# Patient Record
Sex: Male | Born: 1945
Health system: Southern US, Community
[De-identification: ages and names within clinical notes are randomized; demographics above are authoritative.]

## PROBLEM LIST (undated history)

## (undated) DIAGNOSIS — I493 Ventricular premature depolarization: Secondary | ICD-10-CM

## (undated) DIAGNOSIS — I4892 Unspecified atrial flutter: Secondary | ICD-10-CM

## (undated) DIAGNOSIS — Q2381 Bicuspid aortic valve: Secondary | ICD-10-CM

## (undated) DIAGNOSIS — I350 Nonrheumatic aortic (valve) stenosis: Secondary | ICD-10-CM

## (undated) DIAGNOSIS — I480 Paroxysmal atrial fibrillation: Secondary | ICD-10-CM

## (undated) DIAGNOSIS — Q231 Congenital insufficiency of aortic valve: Secondary | ICD-10-CM

## (undated) DIAGNOSIS — I719 Aortic aneurysm of unspecified site, without rupture: Secondary | ICD-10-CM

## (undated) DIAGNOSIS — K219 Gastro-esophageal reflux disease without esophagitis: Secondary | ICD-10-CM

## (undated) DIAGNOSIS — I4510 Unspecified right bundle-branch block: Secondary | ICD-10-CM

## (undated) DIAGNOSIS — I4729 Other ventricular tachycardia: Secondary | ICD-10-CM

## (undated) DIAGNOSIS — I472 Ventricular tachycardia: Secondary | ICD-10-CM

## (undated) DIAGNOSIS — Q230 Congenital stenosis of aortic valve: Secondary | ICD-10-CM

## (undated) HISTORY — DX: Unspecified right bundle-branch block: I45.10

## (undated) HISTORY — DX: Congenital insufficiency of aortic valve: Q23.1

## (undated) HISTORY — DX: Unspecified atrial flutter: I48.92

## (undated) HISTORY — PX: CYST EXCISION: SHX5701

## (undated) HISTORY — DX: Other ventricular tachycardia: I47.29

## (undated) HISTORY — DX: Ventricular premature depolarization: I49.3

## (undated) HISTORY — DX: Paroxysmal atrial fibrillation: I48.0

## (undated) HISTORY — DX: Aortic aneurysm of unspecified site, without rupture: I71.9

## (undated) HISTORY — DX: Gastro-esophageal reflux disease without esophagitis: K21.9

## (undated) HISTORY — DX: Nonrheumatic aortic (valve) stenosis: I35.0

## (undated) HISTORY — DX: Ventricular tachycardia: I47.2

## (undated) HISTORY — DX: Congenital stenosis of aortic valve: Q23.0

## (undated) HISTORY — DX: Bicuspid aortic valve: Q23.81

## (undated) HISTORY — PX: COLONOSCOPY: SHX174

---

## 2002-08-26 ENCOUNTER — Ambulatory Visit (HOSPITAL_COMMUNITY): Admission: RE | Admit: 2002-08-26 | Discharge: 2002-08-26 | Payer: Self-pay | Admitting: Gastroenterology

## 2003-09-09 ENCOUNTER — Emergency Department (HOSPITAL_COMMUNITY): Admission: EM | Admit: 2003-09-09 | Discharge: 2003-09-09 | Payer: Self-pay | Admitting: Emergency Medicine

## 2014-09-04 DIAGNOSIS — R002 Palpitations: Secondary | ICD-10-CM | POA: Insufficient documentation

## 2017-05-29 DIAGNOSIS — L819 Disorder of pigmentation, unspecified: Secondary | ICD-10-CM | POA: Diagnosis not present

## 2017-05-29 DIAGNOSIS — D485 Neoplasm of uncertain behavior of skin: Secondary | ICD-10-CM | POA: Diagnosis not present

## 2017-05-29 DIAGNOSIS — L570 Actinic keratosis: Secondary | ICD-10-CM | POA: Diagnosis not present

## 2017-05-29 DIAGNOSIS — L820 Inflamed seborrheic keratosis: Secondary | ICD-10-CM | POA: Diagnosis not present

## 2017-05-29 DIAGNOSIS — L821 Other seborrheic keratosis: Secondary | ICD-10-CM | POA: Diagnosis not present

## 2017-07-08 DIAGNOSIS — L819 Disorder of pigmentation, unspecified: Secondary | ICD-10-CM | POA: Diagnosis not present

## 2017-07-08 DIAGNOSIS — L821 Other seborrheic keratosis: Secondary | ICD-10-CM | POA: Diagnosis not present

## 2017-09-03 ENCOUNTER — Encounter: Payer: Self-pay | Admitting: Family Medicine

## 2017-09-03 ENCOUNTER — Ambulatory Visit (INDEPENDENT_AMBULATORY_CARE_PROVIDER_SITE_OTHER): Payer: PPO | Admitting: Family Medicine

## 2017-09-03 VITALS — BP 146/70 | HR 62 | Temp 97.8°F | Resp 12 | Ht 73.5 in | Wt 202.0 lb

## 2017-09-03 DIAGNOSIS — Z1159 Encounter for screening for other viral diseases: Secondary | ICD-10-CM | POA: Diagnosis not present

## 2017-09-03 DIAGNOSIS — R011 Cardiac murmur, unspecified: Secondary | ICD-10-CM | POA: Diagnosis not present

## 2017-09-03 DIAGNOSIS — Z7689 Persons encountering health services in other specified circumstances: Secondary | ICD-10-CM | POA: Diagnosis not present

## 2017-09-03 DIAGNOSIS — R002 Palpitations: Secondary | ICD-10-CM

## 2017-09-03 DIAGNOSIS — E663 Overweight: Secondary | ICD-10-CM

## 2017-09-03 NOTE — Progress Notes (Signed)
Patient ID: Jerry Velazquez, male    DOB: 1945-06-27, 72 y.o.   MRN: [id number]  PCP: [name], PA-C  Chief Complaint  Patient presents with  . Palpitations  . Establish Care    fasting    Subjective:   Jerry Velazquez is a 72 y.o. male is new to establish care here presents with chief complaint of palpitations and would like to be checked for A.fib and make sure he is not at risk for having a stroke.    Patient states that he has been feeling a flutter in his chest that used to come in frequently and intermittently he believes it began several years ago.  It first occurred when he was drinking alcohol more heavily and on days when he drank a lot of alcohol he would feel a flutter and it would regularly recur with heavy drinking.  His symptoms became more noticeable and more frequent over the last 1 to 2 months, without any associated symptoms he denies any dizziness, weakness, chest pain, diaphoresis, near syncope.  Feels a sensation of a flutter in his upper chest but he feels like his pulse has been regular when he checked it.  Currently he is having symptoms pretty much every day and is even beginning to wake him up at night.  He has stopped drinking alcohol completely.  He used to be a very heavy caffeine drinker he states and 5 weeks ago he cold [place] stop caffeine and alcohol intake.  Symptoms did not improve at all and they have become gradually worse.   Patient states that he has "never had a PCP" and has not had any health problems in the past.  He reports being a former smoker and smoked a pipe from age [age] over 80 years.  He had a colonoscopy done 12 years ago with [facility] physicians.  He is married and sexually active with his wife although 12 years ago he did begin to have some erectile dysfunction problems and he believes he had his PSA done about 12 years ago.  He has not had any other recent well visits, screening test, immunizations or vaccinations.  He takes supplements  over-the-counter including vitamin D, glucosamine and omega-3 fatty acids.  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  He notes family history of his father who had a stroke at the age of 87 or 96 and other than that believes he has high blood pressure.  He has a brother with high blood pressure and skin cancer, sister with skin cancer, another living sister in good health, and believes his mother died at age 37 with some edema or congestive heart failure.  There are no active problems to display for this patient.    Prior to Admission medications   Medication Sig Start Date End Date Taking? Authorizing Provider  Cholecalciferol (HM VITAMIN D3) 4000 units CAPS Take 4,000 Units by mouth daily.   Yes [provider]  GLUCOSAMINE HCL PO Take 2,000 mg by mouth daily.   Yes [provider]  Omega-3 Fatty Acids (FISH OIL) 1000 MG CAPS Take by mouth.   Yes [provider]     No Known Allergies   Family History  Problem Relation Age of Onset  . Heart disease Mother   . Hypertension Father   . Stroke Father   .  COPD Sister   . Hypertension Brother   . COPD Brother      Social History   Socioeconomic History  . Marital status: Married    Spouse name: Not on file  . Number of children: Not on file  . Years of education: Not on file  . Highest education level: Not on file  Occupational History  . 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 Financial resource strain: Not on file  . Food insecurity:    Worry: Not on file    Inability: Not on file  . Transportation needs:    Medical: Not on file    Non-medical: Not on file  Tobacco Use  . Smoking status: Former Smoker    Years: 18.00    Types: Pipe  . Smokeless tobacco: Never Used  . Tobacco comment: smoked a pipe  Substance and Sexual  Activity  . Alcohol use: Not Currently  . Drug use: Never  . Sexual activity: Yes  Lifestyle  . Physical activity:    Days per week: Not on file    Minutes per session: Not on file  . Stress: Not on file  Relationships  . Social connections:    Talks on phone: Not on file    Gets together: Not on file    Attends religious service: Not on file    Active member of club or organization: Not on file    Attends meetings of clubs or organizations: Not on file    Relationship status: Not on file  . Intimate partner violence:    Fear of current or ex partner: Not on file    Emotionally abused: Not on file    Physically abused: Not on file    Forced sexual activity: Not on file  Other Topics Concern  . Not on file  Social History Narrative  . Not on file      Review of Systems  Constitution: Negative. Negative for chills, decreased appetite, diaphoresis, fever, malaise/fatigue, night sweats, weight gain and weight loss.  HENT: Negative.  Negative for congestion.   Eyes: Negative.  Negative for blurred vision, double vision and visual disturbance.  Cardiovascular: Positive for palpitations. Negative for chest pain, claudication, cyanosis, dyspnea on exertion, leg swelling, near-syncope, orthopnea, paroxysmal nocturnal dyspnea and syncope.  Respiratory: Negative.  Negative for cough, shortness of breath, sleep disturbances due to breathing and snoring.   Endocrine: Negative.   Hematologic/Lymphatic: Negative.  Negative for bleeding problem. Does not bruise/bleed easily.  Skin: Negative.   Musculoskeletal: Negative.  Negative for arthritis, back pain, falls, muscle cramps, muscle weakness, myalgias, neck pain and stiffness.  Gastrointestinal: Negative.  Negative for bloating, abdominal pain, constipation, diarrhea, nausea and vomiting.  Genitourinary: Negative.   Neurological: Negative.  Negative for difficulty with concentration, disturbances in coordination, focal weakness, headaches,  light-headedness, loss of balance, numbness, tremors, vertigo and weakness.  Psychiatric/Behavioral: Negative.   Allergic/Immunologic: Negative.   All other systems reviewed and are negative.      Objective:    Vitals:   09/03/17 0821  BP: (!) 146/70  Pulse: 62  Resp: 12  Temp: 97.8 F (36.6 C)  TempSrc: Oral  SpO2: 97%  Weight: 202 lb (91.6 kg)  Height: 6' 1.5" (1.867 m)      Physical Exam  Constitutional: He is oriented to person, place, and time. He appears well-developed and well-nourished.  Non-toxic appearance. He does not have a sickly appearance. He does  not appear ill. No distress.  Well-appearing male, appears stated age, no acute distress  HENT:  Head: Normocephalic and atraumatic.  Right Ear: External ear normal.  Left Ear: External ear normal.  Nose: Nose normal. No mucosal edema or rhinorrhea.  Mouth/Throat: Uvula is midline, oropharynx is clear and moist and mucous membranes are normal. Mucous membranes are not pale, not dry and not cyanotic. No trismus in the jaw. No uvula swelling. No oropharyngeal exudate, posterior oropharyngeal edema or posterior oropharyngeal erythema.  Eyes: Pupils are equal, round, and reactive to light. Conjunctivae, EOM and lids are normal.  Neck: Trachea normal, normal range of motion and phonation normal. Neck supple. No JVD present. Carotid bruit is not present. No tracheal deviation present. No thyroid mass and no thyromegaly present.  Cardiovascular: Normal rate, regular rhythm, intact distal pulses and normal pulses.  No extrasystoles are present. PMI is not displaced. Exam reveals no gallop and no friction rub.  Murmur heard. Pulses:      Radial pulses are 2+ on the right side, and 2+ on the left side.       Dorsalis pedis pulses are 2+ on the right side, and 2+ on the left side.       Posterior tibial pulses are 2+ on the right side, and 2+ on the left side.  Murmur radiates throughout precordium Do not hear with auscultation  of carotids or left axilla, no palpable thrill to chest wall No lower extremity edema  Pulmonary/Chest: Effort normal and breath sounds normal. He has no wheezes. He has no rhonchi. He has no rales.  Abdominal: Soft. Normal appearance and bowel sounds are normal. He exhibits no distension. There is no tenderness. There is no rebound and no guarding.  Musculoskeletal: Normal range of motion. He exhibits no edema.  Neurological: He is alert and oriented to person, place, and time. Gait normal.  Skin: Skin is warm, dry and intact. Capillary refill takes less than 2 seconds. No rash noted. He is not diaphoretic.  Psychiatric: He has a normal mood and affect. His speech is normal and behavior is normal.  Nursing note and vitals reviewed.   EKG:  Sinus bradycardia, HR 46, left axis deviation, nonspecific widening of QRS interval .112 sec, no ST elevation or depression No PVC's      Assessment & Plan:      ICD-10-CM   1. Palpitations R00.2 EKG 12-Lead    CBC with Differential/Platelet    COMPLETE METABOLIC PANEL WITH GFR    Lipid panel    TSH    ECHOCARDIOGRAM COMPLETE  2. Murmur R01.1 ECHOCARDIOGRAM COMPLETE  3. Overweight (BMI 25.0-29.9) E66.3   4. Need for hepatitis C screening test Z11.59 Hepatitis C antibody  5. Encounter to establish care with new doctor Z76.89     Otherwise healthy 72 year old male presents with fluttering in his chest for several years that has worsened over the past 2 months, now occurring daily and he feels it when he wakes up in the middle the night, he has stopped drinking alcohol completely and stop caffeine use completely, not a current smoker, BMI very mildly elevated, no strong family history of CVD.  Patient is very well-appearing without any associated symptoms.  No syncope, weakness, dyspnea on exertion, extremity edema, PND, chest pain.    Physical exam he had regular pulses without any signs of fluid overload.  He did have a murmur which radiated  throughout the precordium.  And otherwise physical exam was unremarkable and he is  well-appearing elderly male, looks younger than his age and appears to be in good physical condition.  EKG did not reveal any arrhythmia but there is some minor abnormalities and sinus brady.  Will work-up murmur and palpitations with Holter monitor and echocardiogram, and labs - CBC, CMP, TSH, FLP.  Pt had mildly elevated BP, but was very anxious.  HR was normal with vital signs and at time of exam, EKG showed sinus bradycardia.    [name], PA-C 09/03/17 10:19 AM    09/10/17 8:41 PM  Results for orders placed or performed in visit on 09/03/17  CBC with Differential/Platelet  Result Value Ref Range   WBC 4.3 3.8 - 10.8 Thousand/uL   RBC 4.55 4.20 - 5.80 Million/uL   Hemoglobin 14.0 13.2 - 17.1 g/dL   HCT 41.3 38.5 - 50.0 %   MCV 90.8 80.0 - 100.0 fL   MCH 30.8 27.0 - 33.0 pg   MCHC 33.9 32.0 - 36.0 g/dL   RDW 13.0 11.0 - 15.0 %   Platelets 204 140 - 400 Thousand/uL   MPV 11.1 7.5 - 12.5 fL   Neutro Abs 2,111 1,500 - 7,800 cells/uL   Lymphs Abs 1,406 850 - 3,900 cells/uL   WBC mixed population 525 200 - 950 cells/uL   Eosinophils Absolute 219 15 - 500 cells/uL   Basophils Absolute 39 0 - 200 cells/uL   Neutrophils Relative % 49.1 %   Total Lymphocyte 32.7 %   Monocytes Relative 12.2 %   Eosinophils Relative 5.1 %   Basophils Relative 0.9 %  COMPLETE METABOLIC PANEL WITH GFR  Result Value Ref Range   Glucose, Bld 83 65 - 99 mg/dL   BUN 13 7 - 25 mg/dL   Creat 0.76 0.70 - 1.18 mg/dL   GFR, Est Non African American 91 > OR = 60 mL/min/1.81m2   GFR, Est African American 105 > OR = 60 mL/min/1.38m2   BUN/Creatinine Ratio NOT APPLICABLE 6 - 22 (calc)   Sodium 141 135 - 146 mmol/L   Potassium 4.2 3.5 - 5.3 mmol/L   Chloride 106 98 - 110 mmol/L   CO2 25 20 - 32 mmol/L   Calcium 8.9 8.6 - 10.3 mg/dL   Total Protein 6.7 6.1 - 8.1 g/dL   Albumin 4.1 3.6 - 5.1 g/dL   Globulin 2.6 1.9 - 3.7  g/dL (calc)   AG Ratio 1.6 1.0 - 2.5 (calc)   Total Bilirubin 0.7 0.2 - 1.2 mg/dL   Alkaline phosphatase (APISO) 69 40 - 115 U/L   AST 14 10 - 35 U/L   ALT 12 9 - 46 U/L  Lipid panel  Result Value Ref Range   Cholesterol 160 <200 mg/dL   HDL 44 >40 mg/dL   Triglycerides 44 <150 mg/dL   LDL Cholesterol (Calc) 103 (H) mg/dL (calc)   Total CHOL/HDL Ratio 3.6 <5.0 (calc)   Non-HDL Cholesterol (Calc) 116 <130 mg/dL (calc)  Hepatitis C antibody  Result Value Ref Range   Hepatitis C Ab NON-REACTIVE NON-REACTI   SIGNAL TO CUT-OFF 0.05 <1.00  TSH  Result Value Ref Range   TSH 1.47 0.40 - 4.50 mIU/L   Labs pertinent for mildly elevated LDL.  Normal electrolytes, excellent kidney function, normal liver function no anemia, fasting sugar was normal, TSH normal, screening hepatitis C was negative.  Holter monitor pending

## 2017-09-03 NOTE — Patient Instructions (Signed)
Palpitations A palpitation is the feeling that your heart:  Has an uneven (irregular) heartbeat.  Is beating faster than normal.  Is fluttering.  Is skipping a beat.  This is usually not a serious problem. In some cases, you may need more medical tests. Follow these instructions at home:  Avoid: ? Caffeine in coffee, tea, soft drinks, diet pills, and energy drinks. ? Chocolate. ? Alcohol.  Do not use any tobacco products. These include cigarettes, chewing tobacco, and e-cigarettes. If you need help quitting, ask your doctor.  Try to reduce your stress. These things may help: ? Yoga. ? Meditation. ? Physical activity. Swimming, jogging, and walking are good choices. ? A method that helps you use your mind to control things in your body, like heartbeats (biofeedback).  Get plenty of rest and sleep.  Take over-the-counter and prescription medicines only as told by your doctor.  Keep all follow-up visits as told by your doctor. This is important. Contact a doctor if:  Your heartbeat is still fast or uneven after 24 hours.  Your palpitations occur more often. Get help right away if:  You have chest pain.  You feel short of breath.  You have a very bad headache.  You feel dizzy.  You pass out (faint). This information is not intended to replace advice given to you by your health care provider. Make sure you discuss any questions you have with your health care provider. Document Released: 10/10/2007 Document Revised: 06/08/2015 Document Reviewed: 09/15/2014 Elsevier Interactive Patient Education  2018 [place] A heart murmur is an extra sound that is caused by chaotic blood flow. The murmur can be heard as a "hum" or "whoosh" sound when blood flows through the heart. The heart has four areas called chambers. Valves separate the upper and lower chambers from each other (tricuspid valve and mitral valve) and separate the lower chambers of the heart  from pathways that lead away from the heart (aortic valve and pulmonary valve). Normally, the valves open to let blood flow through or out of your heart, and then they shut to keep the blood from flowing backward. There are two types of heart murmurs:  Innocent murmurs. Most people with this type of heart murmur do not have a heart problem. Many children have innocent heart murmurs. Your health care provider may suggest some basic testing to find out whether your murmur is an innocent murmur. If an innocent heart murmur is found, there is no need for further tests or treatment and no need to restrict activities or stop playing sports.  Abnormal murmurs. These types of murmurs can occur in children and adults. Abnormal murmurs may be a sign of a more serious heart condition, such as a heart defect present at birth (congenital defect) or heart valve disease.  What are the causes? This condition is caused by heart valves that are not working properly. In children, abnormal heart murmurs are typically caused by congenital defects. In adults, abnormal murmurs are usually from heart valve problems caused by disease, infection, or aging. Three types of heart valve defects can cause a murmur:  Regurgitation. This is when blood leaks back through the valve in the wrong direction.  Mitral valve prolapse. This is when the mitral valve of the heart has a loose flap and does not close tightly.  Stenosis. This is when a valve does not open enough and blocks blood flow.  This condition may also be caused by:  Pregnancy.  Fever.  Overactive thyroid gland.  Anemia.  Exercise.  Rapid growth spurts (in children).  What are the signs or symptoms? Innocent murmurs do not cause symptoms, and many people with abnormal murmurs may or may not have symptoms. If symptoms do develop, they may include:  Shortness of breath.  Blue coloring of the skin, especially on the fingertips.  Chest  pain.  Palpitations, or feeling a fluttering or skipped heartbeat.  Fainting.  Persistent cough.  Getting tired much faster than expected.  Swelling in the abdomen, feet, or ankles.  How is this diagnosed? This condition may be diagnosed during a routine physical or other exam. If your health care provider hears a murmur with a stethoscope, he or she will listen for:  Where the murmur is located in your heart.  How long the murmur lasts (duration).  When the murmur is heard during the heartbeat.  How loud the murmur is. This may help the health care provider figure out what is causing the murmur.  You may be referred to a heart specialist (cardiologist). You may also have other tests, including:  Electrocardiogram (ECG or EKG). This test measures the electrical activity of your heart.  Echocardiogram. This test uses high frequency sound waves to make pictures of your heart.  MRI or chest X-ray.  Cardiac catheterization. This test looks at blood flow through the heart.  For children and adults who have an abnormal heart murmur and want to stay active, it is important to complete testing, review test results, and receive recommendations from your health care provider. If heart disease is present, it may not be safe to play or be active. How is this treated? Heart murmurs themselves do not need treatment. In some cases, a heart murmur may go away on its own. If an underlying problem or disease is causing the murmur, you may need treatment. If treatment is needed, it will depend on the type and severity of the disease or heart problem causing the murmur. Treatment may include:  Medicine.  Surgery.  Dietary and lifestyle changes.  Follow these instructions at home:  Talk with your health care provider before participating in sports or other activities that require a lot of effort and energy (are strenuous).  Learn as much as possible about your condition and any related  diseases. Ask your health care provider if you may at risk for any medical emergencies.  Talk with your health care provider about what symptoms you should look out for.  It is up to you to get your test results. Ask your health care provider, or the department that is doing the test, when your results will be ready.  Keep all follow-up visits as told by your health care provider. This is important. Contact a health care provider if:  You feel light-headed.  You are frequently short of breath.  You feel more tired than usual.  You are having a hard time keeping up with normal activities or fitness routines.  You have swelling in your ankles or feet.  You have chest pain.  You notice that your heart often beats irregularly.  You develop any new symptoms. Get help right away if:  You develop severe chest pain.  You are having trouble breathing.  You have fainting spells.  Your symptoms suddenly get worse. These symptoms may represent a serious problem that is an emergency. Do not wait to see if the symptoms will go away. Get medical help right away. Call your local emergency services (  911 in the [place] Do not drive yourself to the hospital. Summary  Normally, the heart valves open to let blood flow through or out of your heart, and then they shut to keep the blood from flowing backward.  Heart murmur is caused by heart valves that are not working properly.  You may need treatment if an underlying problem or disease is causing the heart murmur. Treatment may include medicine, surgery, or dietary and lifestyle changes.  Talk with your health care provider before participating in sports or other activities that require a lot of effort and energy (are strenuous).  Talk with your health care provider about what symptoms you should watch out for. This information is not intended to replace advice given to you by your health care provider. Make sure you discuss any questions you  have with your health care provider. Document Released: 02/08/2004 Document Revised: 12/20/2015 Document Reviewed: 12/20/2015 Elsevier Interactive Patient Education  2018 [place] Maintenance, Male A healthy lifestyle and preventive care is important for your health and wellness. Ask your health care provider about what schedule of regular examinations is right for you. What should I know about weight and diet? Eat a Healthy Diet  Eat plenty of vegetables, fruits, whole grains, low-fat dairy products, and lean protein.  Do not eat a lot of foods high in solid fats, added sugars, or salt.  Maintain a Healthy Weight Regular exercise can help you achieve or maintain a healthy weight. You should:  Do at least 150 minutes of exercise each week. The exercise should increase your heart rate and make you sweat (moderate-intensity exercise).  Do strength-training exercises at least twice a week.  Watch Your Levels of Cholesterol and Blood Lipids  Have your blood tested for lipids and cholesterol every 5 years starting at 73 years of age. If you are at high risk for heart disease, you should start having your blood tested when you are 73 years old. You may need to have your cholesterol levels checked more often if: ? Your lipid or cholesterol levels are high. ? You are older than 73 years of age. ? You are at high risk for heart disease.  What should I know about cancer screening? Many types of cancers can be detected early and may often be prevented. Lung Cancer  You should be screened every year for lung cancer if: ? You are a current smoker who has smoked for at least 30 years. ? You are a former smoker who has quit within the past 15 years.  Talk to your health care provider about your screening options, when you should start screening, and how often you should be screened.  Colorectal Cancer  Routine colorectal cancer screening usually begins at 73 years of age and  should be repeated every 5-10 years until you are 73 years old. You may need to be screened more often if early forms of precancerous polyps or small growths are found. Your health care provider may recommend screening at an earlier age if you have risk factors for colon cancer.  Your health care provider may recommend using home test kits to check for hidden blood in the stool.  A small camera at the end of a tube can be used to examine your colon (sigmoidoscopy or colonoscopy). This checks for the earliest forms of colorectal cancer.  Prostate and Testicular Cancer  Depending on your age and overall health, your health care provider may do certain tests to screen for  prostate and testicular cancer.  Talk to your health care provider about any symptoms or concerns you have about testicular or prostate cancer.  Skin Cancer  Check your skin from head to toe regularly.  Tell your health care provider about any new moles or changes in moles, especially if: ? There is a change in a mole's size, shape, or color. ? You have a mole that is larger than a pencil eraser.  Always use sunscreen. Apply sunscreen liberally and repeat throughout the day.  Protect yourself by wearing long sleeves, pants, a wide-brimmed hat, and sunglasses when outside.  What should I know about heart disease, diabetes, and high blood pressure?  If you are 20-84 years of age, have your blood pressure checked every 3-5 years. If you are 63 years of age or older, have your blood pressure checked every year. You should have your blood pressure measured twice-once when you are at a hospital or clinic, and once when you are not at a hospital or clinic. Record the average of the two measurements. To check your blood pressure when you are not at a hospital or clinic, you can use: ? An automated blood pressure machine at a pharmacy. ? A home blood pressure monitor.  Talk to your health care provider about your target blood  pressure.  If you are between 71-38 years old, ask your health care provider if you should take aspirin to prevent heart disease.  Have regular diabetes screenings by checking your fasting blood sugar level. ? If you are at a normal weight and have a low risk for diabetes, have this test once every three years after the age of 59. ? If you are overweight and have a high risk for diabetes, consider being tested at a younger age or more often.  A one-time screening for abdominal aortic aneurysm (AAA) by ultrasound is recommended for men aged 24-75 years who are current or former smokers. What should I know about preventing infection? Hepatitis B If you have a higher risk for hepatitis B, you should be screened for this virus. Talk with your health care provider to find out if you are at risk for hepatitis B infection. Hepatitis C Blood testing is recommended for:  Everyone born from [birth year] through 1966.  Anyone with known risk factors for hepatitis C.  Sexually Transmitted Diseases (STDs)  You should be screened each year for STDs including gonorrhea and chlamydia if: ? You are sexually active and are younger than 73 years of age. ? You are older than 73 years of age and your health care provider tells you that you are at risk for this type of infection. ? Your sexual activity has changed since you were last screened and you are at an increased risk for chlamydia or gonorrhea. Ask your health care provider if you are at risk.  Talk with your health care provider about whether you are at high risk of being infected with HIV. Your health care provider may recommend a prescription medicine to help prevent HIV infection.  What else can I do?  Schedule regular health, dental, and eye exams.  Stay current with your vaccines (immunizations).  Do not use any tobacco products, such as cigarettes, chewing tobacco, and e-cigarettes. If you need help quitting, ask your health care  provider.  Limit alcohol intake to no more than 2 drinks per day. One drink equals 12 ounces of beer, 5 ounces of wine, or 1 ounces of hard liquor.  Do not  use street drugs.  Do not share needles.  Ask your health care provider for help if you need support or information about quitting drugs.  Tell your health care provider if you often feel depressed.  Tell your health care provider if you have ever been abused or do not feel safe at home. This information is not intended to replace advice given to you by your health care provider. Make sure you discuss any questions you have with your health care provider. Document Released: 06/29/2007 Document Revised: 08/30/2015 Document Reviewed: 10/04/2014 Elsevier Interactive Patient Education  [employer].

## 2017-09-04 ENCOUNTER — Encounter: Payer: Self-pay | Admitting: Family Medicine

## 2017-09-04 LAB — COMPLETE METABOLIC PANEL WITH GFR
AG Ratio: 1.6 (calc) (ref 1.0–2.5)
ALBUMIN MSPROF: 4.1 g/dL (ref 3.6–5.1)
ALKALINE PHOSPHATASE (APISO): 69 U/L (ref 40–115)
ALT: 12 U/L (ref 9–46)
AST: 14 U/L (ref 10–35)
BUN: 13 mg/dL (ref 7–25)
CO2: 25 mmol/L (ref 20–32)
CREATININE: 0.76 mg/dL (ref 0.70–1.18)
Calcium: 8.9 mg/dL (ref 8.6–10.3)
Chloride: 106 mmol/L (ref 98–110)
GFR, EST AFRICAN AMERICAN: 105 mL/min/{1.73_m2} (ref >=60)
GFR, EST NON AFRICAN AMERICAN: 91 mL/min/{1.73_m2} (ref >=60)
GLOBULIN: 2.6 g/dL (ref 1.9–3.7)
Glucose, Bld: 83 mg/dL (ref 65–99)
Potassium: 4.2 mmol/L (ref 3.5–5.3)
SODIUM: 141 mmol/L (ref 135–146)
TOTAL PROTEIN: 6.7 g/dL (ref 6.1–8.1)
Total Bilirubin: 0.7 mg/dL (ref 0.2–1.2)

## 2017-09-04 LAB — CBC WITH DIFFERENTIAL/PLATELET
Basophils Absolute: 39 cells/uL (ref 0–200)
Basophils Relative: 0.9 %
EOS PCT: 5.1 %
Eosinophils Absolute: 219 cells/uL (ref 15–500)
HEMATOCRIT: 41.3 % (ref 38.5–50.0)
HEMOGLOBIN: 14 g/dL (ref 13.2–17.1)
Lymphs Abs: 1406 cells/uL (ref 850–3900)
MCH: 30.8 pg (ref 27.0–33.0)
MCHC: 33.9 g/dL (ref 32.0–36.0)
MCV: 90.8 fL (ref 80.0–100.0)
MPV: 11.1 fL (ref 7.5–12.5)
Monocytes Relative: 12.2 %
NEUTROS PCT: 49.1 %
Neutro Abs: 2111 cells/uL (ref 1500–7800)
Platelets: 204 10*3/uL (ref 140–400)
RBC: 4.55 10*6/uL (ref 4.20–5.80)
RDW: 13 % (ref 11.0–15.0)
Total Lymphocyte: 32.7 %
WBC: 4.3 10*3/uL (ref 3.8–10.8)
WBCMIX: 525 {cells}/uL (ref 200–950)

## 2017-09-04 LAB — TSH: TSH: 1.47 m[IU]/L (ref 0.40–4.50)

## 2017-09-04 LAB — LIPID PANEL
CHOL/HDL RATIO: 3.6 (calc) (ref <5.0)
Cholesterol: 160 mg/dL (ref <200)
HDL: 44 mg/dL (ref >40)
LDL Cholesterol (Calc): 103 mg/dL (calc) — ABNORMAL HIGH
NON-HDL CHOLESTEROL (CALC): 116 mg/dL (ref <130)
Triglycerides: 44 mg/dL (ref <150)

## 2017-09-04 LAB — HEPATITIS C ANTIBODY
HEP C AB: NONREACTIVE
SIGNAL TO CUT-OFF: 0.05 (ref <1.00)

## 2017-09-09 ENCOUNTER — Other Ambulatory Visit: Payer: Self-pay

## 2017-09-09 ENCOUNTER — Ambulatory Visit (HOSPITAL_COMMUNITY): Payer: PPO | Attending: Family Medicine

## 2017-09-09 DIAGNOSIS — R011 Cardiac murmur, unspecified: Secondary | ICD-10-CM

## 2017-09-09 DIAGNOSIS — I517 Cardiomegaly: Secondary | ICD-10-CM | POA: Insufficient documentation

## 2017-09-09 DIAGNOSIS — I351 Nonrheumatic aortic (valve) insufficiency: Secondary | ICD-10-CM | POA: Insufficient documentation

## 2017-09-09 DIAGNOSIS — Z87891 Personal history of nicotine dependence: Secondary | ICD-10-CM | POA: Insufficient documentation

## 2017-09-09 DIAGNOSIS — R002 Palpitations: Secondary | ICD-10-CM | POA: Insufficient documentation

## 2017-09-09 DIAGNOSIS — Q231 Congenital insufficiency of aortic valve: Secondary | ICD-10-CM | POA: Insufficient documentation

## 2017-09-10 ENCOUNTER — Encounter: Payer: Self-pay | Admitting: Family Medicine

## 2017-09-11 ENCOUNTER — Ambulatory Visit (INDEPENDENT_AMBULATORY_CARE_PROVIDER_SITE_OTHER): Payer: PPO | Admitting: *Deleted

## 2017-09-11 DIAGNOSIS — R000 Tachycardia, unspecified: Secondary | ICD-10-CM

## 2017-09-11 DIAGNOSIS — R002 Palpitations: Secondary | ICD-10-CM

## 2017-09-11 NOTE — Progress Notes (Signed)
Patient seen in office to have Holter Monitor placed.   Educated on monitor and will return on 09/12/2017 for removal.

## 2017-09-12 ENCOUNTER — Ambulatory Visit: Payer: PPO

## 2017-09-12 DIAGNOSIS — R002 Palpitations: Secondary | ICD-10-CM

## 2017-09-12 NOTE — Progress Notes (Signed)
Patient was in office to have his holter monitor removed

## 2017-09-17 ENCOUNTER — Other Ambulatory Visit: Payer: Self-pay

## 2017-09-17 ENCOUNTER — Encounter: Payer: Self-pay | Admitting: Family Medicine

## 2017-09-17 ENCOUNTER — Ambulatory Visit (INDEPENDENT_AMBULATORY_CARE_PROVIDER_SITE_OTHER): Payer: PPO | Admitting: Family Medicine

## 2017-09-17 VITALS — BP 138/68 | HR 58 | Temp 97.9°F | Resp 12 | Ht 73.5 in | Wt 204.0 lb

## 2017-09-17 DIAGNOSIS — R002 Palpitations: Secondary | ICD-10-CM | POA: Diagnosis not present

## 2017-09-17 DIAGNOSIS — Z23 Encounter for immunization: Secondary | ICD-10-CM

## 2017-09-17 DIAGNOSIS — E785 Hyperlipidemia, unspecified: Secondary | ICD-10-CM

## 2017-09-17 DIAGNOSIS — Z0000 Encounter for general adult medical examination without abnormal findings: Secondary | ICD-10-CM

## 2017-09-17 DIAGNOSIS — E663 Overweight: Secondary | ICD-10-CM | POA: Diagnosis not present

## 2017-09-17 DIAGNOSIS — Z1211 Encounter for screening for malignant neoplasm of colon: Secondary | ICD-10-CM | POA: Diagnosis not present

## 2017-09-17 MED ORDER — ZOSTER VAC RECOMB ADJUVANTED 50 MCG/0.5ML IM SUSR: 0.5000 mL | Freq: Once | INTRAMUSCULAR | 1 refills | Status: AC

## 2017-09-17 NOTE — Progress Notes (Signed)
Subjective:   Jerry Velazquez is a 72 y.o. male who presents for a Welcome to Medicare exam.   He cannot recall his last Medicare well exam and has not had a PCP in recent years.  Pt still having palpitations - started work up on 09/03/17, pt new to establish care.  Labs were previously reviewed, echo done and currently pending Holter monitor results.  Patient states that he continues to have symptoms.  He is concerned that the 24 hours that he wore the Holter monitor were not very representative of days when he has the most symptoms.  For example over this past weekend he worked outside in the yard for long time and had not eaten very much he noticed increasing duration of palpitations and in the past he has felt this flutter was more aware of his heartbeat in his chest but he would feel his pulse it would be very regular, but this last weekend when his symptoms were much more pronounced he noticed an irregularity to his rhythm when he checked his heartbeat stated that it was regularly irregular and did improve after eating and drinking. He continued to deny near syncope, chest pain, shortness of breath, fatigue  Review of Systems: Review of Systems  Constitutional: Negative.  Negative for activity change, appetite change, fatigue and unexpected weight change.  HENT: Negative.   Eyes: Negative.   Respiratory: Negative.  Negative for shortness of breath.   Cardiovascular: Positive for palpitations. Negative for chest pain and leg swelling.  Gastrointestinal: Negative.  Negative for abdominal pain and blood in stool.  Endocrine: Negative.   Genitourinary: Negative.  Negative for decreased urine volume, difficulty urinating, testicular pain and urgency.  Musculoskeletal: Negative.   Skin: Negative.  Negative for color change and pallor.  Allergic/Immunologic: Negative.   Neurological: Negative.  Negative for syncope, weakness, light-headedness and numbness.  Psychiatric/Behavioral: Negative.   Negative for confusion, dysphoric mood, self-injury and suicidal ideas. The patient is not nervous/anxious.   All other systems reviewed and are negative.    Cardiac Risk Factors include: advanced age (>76men, >38 women);male gender     Objective:    Today's Vitals   09/17/17 0759  BP: 138/68  Pulse: (!) 58  Resp: 12  Temp: 97.9 F (36.6 C)  TempSrc: Oral  SpO2: 98%  Weight: 204 lb (92.5 kg)  Height: 6' 1.5" (1.867 m)   Body mass index is 26.55 kg/m.  Medications Outpatient Encounter Medications as of 09/17/2017  Medication Sig  . Cholecalciferol (HM VITAMIN D3) 4000 units CAPS Take 4,000 Units by mouth daily.  Marland Kitchen GLUCOSAMINE HCL PO Take 2,000 mg by mouth daily.  . Omega-3 Fatty Acids (FISH OIL) 1000 MG CAPS Take by mouth.  . [EXPIRED] Zoster Vaccine Adjuvanted [facility]) injection Inject 0.5 mLs into the muscle once for 1 dose. And repeat once more in 2-6 months   No facility-administered encounter medications on file as of 09/17/2017.      History: History reviewed. No pertinent past medical history. History reviewed. No pertinent surgical history.  Family History  Problem Relation Age of Onset  . Heart disease Mother   . Hypertension Father   . Stroke Father   . COPD Sister   . Hypertension Brother   . COPD Brother    Social History   Occupational History  . 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 Smoking status: Former Smoker    Years: 18.00  Types: Pipe  . Smokeless tobacco: Never Used  . Tobacco comment: smoked a pipe  Substance and Sexual Activity  . Alcohol use: Not Currently  . Drug use: Never  . Sexual activity: Yes   Tobacco Counseling - N/A, former smoker Counseling given: Not Answered Comment: smoked a pipe   Immunizations and Health Maintenance  There is no immunization history on file for this patient.  Pt refused Tdap, refuses PCV13 He agrees to shingrix - sent to pharmacy   Health  Maintenance Due  Topic Date Due  . TETANUS/TDAP  08/12/1963  . COLONOSCOPY  08/12/1994  . PNA vac Low Risk Adult (1 of 2 - PCV13) 08/11/2009  Referred to GI for colonoscopy  Activities of Daily Living In your present state of health, do you have any difficulty performing the following activities: 09/17/2017  Hearing? N  Vision? N  Difficulty concentrating or making decisions? N  Walking or climbing stairs? N  Dressing or bathing? N  Doing errands, shopping? N  Preparing Food and eating ? N  Using the Toilet? N  In the past six months, have you accidently leaked urine? N  Do you have problems with loss of bowel control? N  Managing your Medications? N  Managing your Finances? N  Housekeeping or managing your Housekeeping? N  Some recent data might be hidden    Physical Exam Physical Exam  Constitutional: He is oriented to person, place, and time. He appears well-developed and well-nourished.  Non-toxic appearance. He does not have a sickly appearance. He does not appear ill. No distress.  Well-appearing male, appears stated age, no acute distress  HENT:  Head: Normocephalic and atraumatic.  Right Ear: External ear normal.  Left Ear: External ear normal.  Nose: Nose normal. No mucosal edema or rhinorrhea.  Mouth/Throat: Uvula is midline, oropharynx is clear and moist and mucous membranes are normal. Mucous membranes are not pale, not dry and not cyanotic. No trismus in the jaw. No uvula swelling. No oropharyngeal exudate, posterior oropharyngeal edema or posterior oropharyngeal erythema.  Eyes: Pupils are equal, round, and reactive to light. Conjunctivae, EOM and lids are normal.  Neck: Trachea normal, normal range of motion and phonation normal. Neck supple. No JVD present. Carotid bruit is not present. No tracheal deviation present. No thyroid mass and no thyromegaly present.  Cardiovascular: Normal rate, regular rhythm, intact distal pulses and normal pulses.  No extrasystoles are  present. PMI is not displaced. Exam reveals no gallop and no friction rub.  Murmur heard. Pulses:      Radial pulses are 2+ on the right side, and 2+ on the left side.       Posterior tibial pulses are 2+ on the right side, and 2+ on the left side.  Murmur radiates throughout precordium Do not hear with auscultation of carotids or left axilla, no palpable thrill to chest wall No lower extremity edema  Pulmonary/Chest: Effort normal and breath sounds normal. He has no wheezes. He has no rhonchi. He has no rales.  Abdominal: Soft. Normal appearance and bowel sounds are normal. He exhibits no distension. There is no tenderness. There is no rebound and no guarding.  Musculoskeletal: Normal range of motion. He exhibits no edema.  Neurological: He is alert and oriented to person, place, and time. Gait normal.  Skin: Skin is warm, dry and intact. Capillary refill takes less than 2 seconds. No rash noted. He is not diaphoretic.  Psychiatric: He has a normal mood and affect. His speech is  normal and behavior is normal.  Nursing note and vitals reviewed.    Advanced Directives: Does Patient Have a Medical Advance Directive?: No Would patient like information on creating a medical advance directive?: Yes (MAU/Ambulatory/Procedural Areas - Information given)    Assessment:    This is a routine wellness  examination for this patient .  72 y/o male, with mild obesity, former smoker, currently concerned with palpitations and dx of new murmur/aortic insufficiency, here for MWV.  Vision/Hearing screen - not done No exam data present  Dietary issues and exercise activities discussed:  Current Exercise Habits: The patient has a physically strenous job, but has no regular exercise apart from work., Exercise limited by: None identified  Advised to increase aerobic exercise as long as tolerating w/o cardiac sx.  Goals    . DIET - REDUCE FAT INTAKE       Depression Screen PHQ 2/9 Scores 09/17/2017  09/03/2017  PHQ - 2 Score 0 0     Fall Risk Fall Risk  09/17/2017  Falls in the past year? No    Cognitive Function  Alert? Yes  Normal Appearance?Yes  Oriented to person? Yes  Place? Yes  Time? Yes  Recall of three objects? Yes  Can perform simple calculations? Yes  Displays appropriate judgment?Yes  Can read the correct time from a watch face?Yes            Patient Care Team: [name], PA-C as PCP - General (Family Medicine)     Plan:     A&P:    ICD-10-CM   1. Encounter for Medicare annual wellness exam Z00.00   2. Need for shingles vaccine Z23 Zoster Vaccine Adjuvanted [facility]) injection  3. Screening for colon cancer Z12.11 Ambulatory referral to Gastroenterology  4. Palpitations R00.2    pending holter monitor results, cardiac exam today RRR, + M, no G, no R  5. Overweight (BMI 25.0-29.9) E66.3    diet and exercise  6. Dyslipidemia E78.5    diet and exercise, AHA cholesterol diet printed and reviewed  Recheck labs in 6 months     I have personally reviewed and noted the following in the patient's chart:   . Medical and social history . Use of alcohol, tobacco or illicit drugs  . Current medications and supplements . Functional ability and status . Nutritional status . Physical activity . Advanced directives . List of other physicians . Hospitalizations, surgeries, and ER visits in previous 12 months . Vitals . Screenings to include cognitive, depression, and falls . Referrals and appointments  In addition, I have reviewed and discussed with patient certain preventive protocols, quality metrics, and best practice recommendations. A written personalized care plan for preventive services as well as general preventive health recommendations were provided to patient.    [name], PA-C 09/18/2017

## 2017-09-17 NOTE — Patient Instructions (Signed)
  Jerry Velazquez , Thank you for taking time to come for your Medicare Wellness Visit. I appreciate your ongoing commitment to your health goals. Please review the following plan we discussed and let me know if I can assist you in the future.   These are the goals we discussed: Goals   None     This is a list of the screening recommended for you and due dates:  Health Maintenance  Topic Date Due  . Tetanus Vaccine  08/12/1963  . Colon Cancer Screening  08/12/1994  . Pneumonia vaccines (1 of 2 - PCV13) 08/11/2009  . Flu Shot  08/14/2017  .  Hepatitis C: One time screening is recommended by Center for Disease Control  (CDC) for  adults born from [birth year] through 1966.   Completed

## 2017-09-18 ENCOUNTER — Encounter: Payer: Self-pay | Admitting: Family Medicine

## 2017-09-18 ENCOUNTER — Other Ambulatory Visit: Payer: Self-pay | Admitting: Family Medicine

## 2017-09-18 ENCOUNTER — Encounter: Payer: Self-pay | Admitting: Gastroenterology

## 2017-09-18 ENCOUNTER — Telehealth: Payer: Self-pay | Admitting: Family Medicine

## 2017-09-18 DIAGNOSIS — I471 Supraventricular tachycardia: Secondary | ICD-10-CM | POA: Insufficient documentation

## 2017-09-18 DIAGNOSIS — I351 Nonrheumatic aortic (valve) insufficiency: Secondary | ICD-10-CM

## 2017-09-18 DIAGNOSIS — E663 Overweight: Secondary | ICD-10-CM | POA: Insufficient documentation

## 2017-09-18 DIAGNOSIS — I77810 Thoracic aortic ectasia: Secondary | ICD-10-CM

## 2017-09-18 DIAGNOSIS — R002 Palpitations: Secondary | ICD-10-CM

## 2017-09-18 DIAGNOSIS — E785 Hyperlipidemia, unspecified: Secondary | ICD-10-CM | POA: Insufficient documentation

## 2017-09-18 DIAGNOSIS — Z87891 Personal history of nicotine dependence: Secondary | ICD-10-CM | POA: Insufficient documentation

## 2017-09-18 NOTE — Assessment & Plan Note (Signed)
Diet and exercise.   

## 2017-09-18 NOTE — Telephone Encounter (Signed)
Please call and notify patient  I received and reviewed holter monitor results today.  It did show some abnormal rhythm with some premature beats, some very short runs of supraventricular beats and also shows bradycardia - slow HR, with heart rate ranging 36 to 90.    I have referred to cardiology.

## 2017-09-18 NOTE — Progress Notes (Signed)
Work up for palpitations completed, labs unremarkable except mildly elevated LDL, thyroid normal Holter monitor received today, reviewed - shows arrhythmia HR varying 36 to SVT 118 ECHO pertinent for mild to moderate aortic insufficiency with mild stenosis and Bicuspid, aortic root moderately dilated, right ventricle mildly dilated, LVEF 55-60%  Referral to cardiology

## 2017-10-10 ENCOUNTER — Encounter: Payer: Self-pay | Admitting: Family Medicine

## 2017-10-21 ENCOUNTER — Encounter: Payer: Self-pay | Admitting: Cardiovascular Disease

## 2017-10-21 ENCOUNTER — Ambulatory Visit (INDEPENDENT_AMBULATORY_CARE_PROVIDER_SITE_OTHER): Payer: PPO | Admitting: Cardiovascular Disease

## 2017-10-21 VITALS — BP 158/76 | HR 54 | Ht 73.0 in | Wt 204.0 lb

## 2017-10-21 DIAGNOSIS — Z01812 Encounter for preprocedural laboratory examination: Secondary | ICD-10-CM

## 2017-10-21 DIAGNOSIS — I471 Supraventricular tachycardia: Secondary | ICD-10-CM

## 2017-10-21 DIAGNOSIS — I712 Thoracic aortic aneurysm, without rupture, unspecified: Secondary | ICD-10-CM

## 2017-10-21 DIAGNOSIS — I351 Nonrheumatic aortic (valve) insufficiency: Secondary | ICD-10-CM

## 2017-10-21 NOTE — Addendum Note (Signed)
Addended by: Alvina Filbert B on: 10/21/2017 01:25 PM   Modules accepted: Orders

## 2017-10-21 NOTE — Assessment & Plan Note (Signed)
History of palpitations with recent Holter monitor performed 09/11/2017 revealing PVCs with occasional short atrial runs.  He only wore this for 24 hours but he describes symptoms are not consistent with this.  I am getting a 30-day event monitor to further evaluate.  He has reduced his caffeine and alcohol intake as a result.

## 2017-10-21 NOTE — Assessment & Plan Note (Signed)
Recent 2D echo revealed mild aortic stenosis, mild to moderate aortic insufficiency, normal LV size and function mildly dilated thoracic aorta.  We will repeat 2D echo in 12 months and also get a CTA of his chest to more accurately measure his aortic dilatation.

## 2017-10-21 NOTE — Patient Instructions (Signed)
Medication Instructions:  Your physician recommends that you continue on your current medications as directed. Please refer to the Current Medication list given to you today.  If you need a refill on your cardiac medications before your next appointment, please call your pharmacy.   Lab work: none If you have labs (blood work) drawn today and your tests are completely normal, you will receive your results only by: Marland Kitchen MyChart Message (if you have MyChart) OR . A paper copy in the mail If you have any lab test that is abnormal or we need to change your treatment, we will call you to review the results.  Testing/Procedures: Your physician has requested that you have an echocardiogram. Echocardiography is a painless test that uses sound waves to create images of your heart. It provides your doctor with information about the size and shape of your heart and how well your heart's chambers and valves are working. This procedure takes approximately one hour. There are no restrictions for this procedure.SCHEDULE IN 39 MONTHS  Your physician has recommended that you wear an event monitor. Event monitors are medical devices that record the heart's electrical activity. Doctors most often [place] these monitors to diagnose arrhythmias. Arrhythmias are problems with the speed or rhythm of the heartbeat. The monitor is a small, portable device. You can wear one while you do your normal daily activities. This is usually used to diagnose what is causing palpitations/syncope (passing out). [place]  Non-Cardiac CT Angiography (CTA), is a special type of CT of Chest scan that uses a computer to produce multi-dimensional views of major blood vessels throughout the body. In CT angiography, a contrast material is injected through an IV to help visualize the blood vessels   Follow-Up: At [facility], you and your health needs are our priority.  As part of our continuing mission to provide you with exceptional heart  care, we have created designated Provider Care Teams.  These Care Teams include your primary Cardiologist (physician) and Advanced Practice Providers (APPs -  Physician Assistants and Nurse Practitioners) who all work together to provide you with the care you need, when you need it. You will need a follow up appointment in 12 months.  Please call our office 2 months in advance to schedule this appointment.  You may see Dr. Gwenlyn Found or one of the following Advanced Practice Providers on your designated Care Team:   Kerin Ransom, PA-C Roby Lofts, [place] . Sande Rives, PA-C  Any Other Special Instructions Will Be Listed Below (If Applicable).

## 2017-10-21 NOTE — Progress Notes (Signed)
10/21/2017 Jerry Velazquez   Apr 07, 1945  [id number]  Primary Physician [name], PA-C Primary Cardiologist: Lorretta Harp MD Lupe Carney, [place]  HPI:  Jerry Velazquez is a 72 y.o. thin appearing married Caucasian male father of no children who is recently retired Nurse, mental health.  He was referred by [name], PA-C for cardiovascular evaluation because of aortic insufficiency.  He has no cardiac risk factors.  He was seen because of palpitations with a recent Holter monitor that showed PVCs and short atrial runs.  2D echo performed because of his soft murmur revealed a bicuspid aortic valve with mild stenosis and mild to moderate aortic insufficiency with normal LV size and function.  There was noted mild dilatation of the thoracic aorta.  He also experiences a quivering sensation in his chest.  Denies chest pain or shortness of breath.   Current Meds  Medication Sig  . Cholecalciferol (HM VITAMIN D3) 4000 units CAPS Take 4,000 Units by mouth daily.  . Cyanocobalamin (VITAMIN B-12 CR) 1500 MCG TBCR Take 1 tablet by mouth daily.  Marland Kitchen GLUCOSAMINE HCL PO Take 2,000 mg by mouth daily.  . Omega-3 Fatty Acids (FISH OIL) 1000 MG CAPS Take by mouth.     No Known Allergies  Social History   Socioeconomic History  . Marital status: Married    Spouse name: Not on file  . Number of children: Not on file  . Years of education: Not on file  . Highest education level: Not on file  Occupational History  . 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 Financial resource strain: Not on file  . Food insecurity:    Worry: Not on file    Inability: Not on file  . Transportation needs:    Medical: Not on file    Non-medical: Not on file  Tobacco Use  . Smoking status: Former Smoker    Years: 18.00    Types: Pipe  . Smokeless tobacco: Never Used  . Tobacco comment: smoked a pipe  Substance and Sexual Activity  . Alcohol use: Not  Currently  . Drug use: Never  . Sexual activity: Yes  Lifestyle  . Physical activity:    Days per week: Not on file    Minutes per session: Not on file  . Stress: Not on file  Relationships  . Social connections:    Talks on phone: Not on file    Gets together: Not on file    Attends religious service: Not on file    Active member of club or organization: Not on file    Attends meetings of clubs or organizations: Not on file    Relationship status: Not on file  . Intimate partner violence:    Fear of current or ex partner: Not on file    Emotionally abused: Not on file    Physically abused: Not on file    Forced sexual activity: Not on file  Other Topics Concern  . Not on file  Social History Narrative  . Not on file     Review of Systems: General: negative for chills, fever, night sweats or weight changes.  Cardiovascular: negative for chest pain, dyspnea on exertion, edema, orthopnea, palpitations, paroxysmal nocturnal dyspnea or shortness of breath Dermatological: negative for rash Respiratory: negative for cough or wheezing Urologic: negative for hematuria Abdominal: negative for nausea, vomiting, diarrhea, bright red blood per rectum, melena, or  hematemesis Neurologic: negative for visual changes, syncope, or dizziness All other systems reviewed and are otherwise negative except as noted above.    Blood pressure (!) 158/76, pulse (!) 54, height 6\' 1"  (1.854 m), weight 204 lb (92.5 kg).  General appearance: alert and no distress Neck: no adenopathy, no carotid bruit, no JVD, supple, symmetrical, trachea midline and thyroid not enlarged, symmetric, no tenderness/mass/nodules Lungs: clear to auscultation bilaterally Heart: 2/6 systolic ejection murmur at the base with a soft AI murmur. Extremities: extremities normal, atraumatic, no cyanosis or edema Pulses: 2+ and symmetric Skin: Skin color, texture, turgor normal. No rashes or lesions Neurologic: Alert and oriented  X 3, normal strength and tone. Normal symmetric reflexes. Normal coordination and gait  EKG sinus bradycardia 54 complete right bundle branch block and left axis deviation.  I personally reviewed this EKG.  ASSESSMENT AND PLAN:   Aortic insufficiency Recent 2D echo revealed mild aortic stenosis, mild to moderate aortic insufficiency, normal LV size and function mildly dilated thoracic aorta.  We will repeat 2D echo in 12 months and also get a CTA of his chest to more accurately measure his aortic dilatation.  Paroxysmal supraventricular tachycardia (HCC) History of palpitations with recent Holter monitor performed 09/11/2017 revealing PVCs with occasional short atrial runs.  He only wore this for 24 hours but he describes symptoms are not consistent with this.  I am getting a 30-day event monitor to further evaluate.  He has reduced his caffeine and alcohol intake as a result.      Lorretta Harp MD FACP,FACC,FAHA, [facility] 10/21/2017 12:52 PM

## 2017-10-22 ENCOUNTER — Encounter: Payer: Self-pay | Admitting: *Deleted

## 2017-10-22 ENCOUNTER — Encounter: Payer: Self-pay | Admitting: Gastroenterology

## 2017-10-22 ENCOUNTER — Ambulatory Visit (AMBULATORY_SURGERY_CENTER): Payer: Self-pay | Admitting: *Deleted

## 2017-10-22 ENCOUNTER — Telehealth: Payer: Self-pay | Admitting: *Deleted

## 2017-10-22 VITALS — Ht 73.0 in | Wt 202.0 lb

## 2017-10-22 DIAGNOSIS — Z1211 Encounter for screening for malignant neoplasm of colon: Secondary | ICD-10-CM

## 2017-10-22 LAB — BASIC METABOLIC PANEL
BUN / CREAT RATIO: 17 (ref 10–24)
BUN: 13 mg/dL (ref 8–27)
CO2: 23 mmol/L (ref 20–29)
Calcium: 9.2 mg/dL (ref 8.6–10.2)
Chloride: 104 mmol/L (ref 96–106)
Creatinine, Ser: 0.75 mg/dL — ABNORMAL LOW (ref 0.76–1.27)
GFR calc Af Amer: 105 mL/min/{1.73_m2} (ref >59)
GFR calc non Af Amer: 91 mL/min/{1.73_m2} (ref >59)
Glucose: 82 mg/dL (ref 65–99)
Potassium: 5.3 mmol/L — ABNORMAL HIGH (ref 3.5–5.2)
SODIUM: 142 mmol/L (ref 134–144)

## 2017-10-22 MED ORDER — PEG-KCL-NACL-NASULF-NA ASC-C 140 G PO SOLR: 1.0000 | ORAL | 0 refills | Status: DC

## 2017-10-22 NOTE — Telephone Encounter (Signed)
Jerry Velazquez,  It would be prudent to delay this elective colon screening until his CARDS eval is completed.  Thanks much,  [name]

## 2017-10-22 NOTE — Telephone Encounter (Signed)
John,  Can you please review this pt's cardiac office note from yesterday -   He has a PV today and his colon is 10-23 with Dr Loletha Carrow.. The cardiologist wants a 30 day Holter monitor- should pt wait until AFTER the 30 days for his colon or is he okay to proceed   .Marland Kitchen He had a 24 hour monitor with PVC's, he has complete RBBB, Echo 09-09-17 showed EF 5-60 % and mild aortic stenosis Please Advise, Thanks   Lelan Pons

## 2017-10-22 NOTE — Progress Notes (Signed)
No egg or soy allergy known to patient  No issues with past sedation with any surgeries  or procedures, no intubation problems  No diet pills per patient No home 02 use per patient  No blood thinners per patient  Pt denies issues with constipation  No A fib or A flutter  EMMI video sent to pt's e mail -- pt declined TE to [employer] CRNA about cario OV note and Holter monitor need x 30 days- ? To proceed with scheduled 10-23  [place] Sample  [id number]  Exp 07/2018 as directed

## 2017-10-22 NOTE — Telephone Encounter (Signed)
Called Mr Kunath to let him know recommendation from Lucretia Kern, CRNA- no answer- [facility] Thursday 10-10 between 8-5 pm- I did cancel his colon with Dr Loletha Carrow 10-23 already .

## 2017-10-23 NOTE — Telephone Encounter (Signed)
Called patient. No answer, left message for patient to call us back so we will know that he has this message.

## 2017-10-24 NOTE — Telephone Encounter (Signed)
Pt returned call stating that he is in agreement with Dr. Loletha Carrow recommendation to clx proc.

## 2017-10-24 NOTE — Telephone Encounter (Signed)
Attempted pt- I did leave a detailed message on his voice mail that identifies him but first and last name, that I cancelled his October colonoscopy and once he has completely been cleared by CARDS, he will need to call back and reschedule his screening colon- I left my name and number to call me back if he has any questions   Lelan Pons PV

## 2017-10-27 NOTE — Telephone Encounter (Signed)
Pt called back and said he will call back to reschedule once he is cleared by cardiology

## 2017-10-31 ENCOUNTER — Ambulatory Visit (INDEPENDENT_AMBULATORY_CARE_PROVIDER_SITE_OTHER)
Admission: RE | Admit: 2017-10-31 | Discharge: 2017-10-31 | Disposition: A | Discharge status: Home / Self Care | Payer: PPO | Source: Ambulatory Visit | Attending: Cardiovascular Disease | Admitting: Cardiovascular Disease

## 2017-10-31 ENCOUNTER — Ambulatory Visit (INDEPENDENT_AMBULATORY_CARE_PROVIDER_SITE_OTHER): Payer: PPO

## 2017-10-31 DIAGNOSIS — I712 Thoracic aortic aneurysm, without rupture, unspecified: Secondary | ICD-10-CM

## 2017-10-31 DIAGNOSIS — I351 Nonrheumatic aortic (valve) insufficiency: Secondary | ICD-10-CM

## 2017-10-31 DIAGNOSIS — I471 Supraventricular tachycardia: Secondary | ICD-10-CM

## 2017-10-31 MED ORDER — IOPAMIDOL (ISOVUE-370) INJECTION 76%
100.0000 mL | Freq: Once | INTRAVENOUS | Status: AC | PRN
  Administered 2017-10-31: 100 mL via INTRAVENOUS

## 2017-11-05 ENCOUNTER — Encounter: Payer: PPO | Admitting: Gastroenterology

## 2017-11-14 ENCOUNTER — Telehealth: Payer: Self-pay | Admitting: Cardiovascular Disease

## 2017-11-14 NOTE — Telephone Encounter (Signed)
New Message:   Patient calling about a ct scan.

## 2017-11-14 NOTE — Telephone Encounter (Signed)
No significant changes other than restriction from lifting heavy objects and severe aerobic activity.  We will continue to follow this by CT a on annual basis.

## 2017-11-14 NOTE — Telephone Encounter (Signed)
Called patient back, he states that since his CT scan, per Dr.Berry stated: Notes recorded by Lorretta Harp, MD on 10/31/2017 at 4:11 PM EDT Coronary CTA showed a 4.8 cm thoracic aortic aneurysm. There were no other significant extracardiac findings.  Patient would like to know if there are any restrictions that he should be aware of, or if any diet restrictions. He would like to know if he should live his life a certain way since the growth of the Aneurysm.   Please advise, thanks!

## 2017-11-17 NOTE — Telephone Encounter (Signed)
Pt aware of Dr. Gwenlyn Found suggestions for heavy lifting and aerobic activity. 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 Pt instructed event though it is occational he should finds a lift or cart to move those items as we don't want him strainig to make thing worse.  Pt currently has on event monitor for A. Fib and wanted to know if there was a correlation to his aneurysm.

## 2017-11-18 ENCOUNTER — Telehealth: Payer: Self-pay | Admitting: Cardiology

## 2017-11-18 NOTE — Telephone Encounter (Signed)
Received a page from [employer] ambulatory monitoring company.   Patient noted to have 6 second run of ventricular tachycardia on event monitor at a rate of 160 bpm.   Attempted to contact patient at [phone], but no answer.   Will forward message to Dr. Gwenlyn Found for follow up.   [employer] to continue to reach out to the patient for follow up as well.   Jerry Pakula K. Marletta Lor, MD

## 2017-11-19 ENCOUNTER — Telehealth: Payer: Self-pay

## 2017-11-19 DIAGNOSIS — I471 Supraventricular tachycardia: Secondary | ICD-10-CM

## 2017-11-19 DIAGNOSIS — I351 Nonrheumatic aortic (valve) insufficiency: Secondary | ICD-10-CM

## 2017-11-19 DIAGNOSIS — R002 Palpitations: Secondary | ICD-10-CM

## 2017-11-19 NOTE — Telephone Encounter (Signed)
Called patient about monitor strips we received no answer.[place].

## 2017-11-19 NOTE — Telephone Encounter (Signed)
Spoke to patient on 11/18/17 at 3:45 pm monitor strip revealed ventricular tachycardia rate 160.Stated he was not aware at that time heart beating fast.He felt fine no dizziness.Monitor strip reviewed by DOD Dr.Kimberly she advised patient needs stress myoview.Advised scheduler will call back to schedule and also schedule follow up appointment with Dr.Berry.

## 2017-11-20 ENCOUNTER — Telehealth: Payer: Self-pay | Admitting: *Deleted

## 2017-11-20 ENCOUNTER — Encounter: Payer: Self-pay | Admitting: Cardiovascular Disease

## 2017-11-20 NOTE — Telephone Encounter (Signed)
Arrange for office visit with APP during Dr. Kennon Holter absence.  May need beta-blocker therapy but will defer until office visit.

## 2017-11-20 NOTE — Telephone Encounter (Signed)
Spoke with patient regarding appointment for Stress Myoview---scheduled 12/02/17 @ 7:45am.  Will mail instructions to patient

## 2017-11-25 ENCOUNTER — Telehealth: Payer: Self-pay | Admitting: Cardiovascular Disease

## 2017-11-25 NOTE — Telephone Encounter (Signed)
Follow up  Please see below information.

## 2017-11-25 NOTE — Telephone Encounter (Signed)
See duplicate telephone note

## 2017-11-25 NOTE — Telephone Encounter (Signed)
Spoke to representative with [employer]- states strip @7 :36 am [place]- a first- burst aflutter  rep states company  Spoke to patient- patient states he felt heart speed up ( eating breakfast)   - awaiting for strip to show to D.O.D

## 2017-11-25 NOTE — Telephone Encounter (Signed)
REVIEWED WITH DR HILTY -  DR HILTY  REVIEWED MONITOR STRIP- AFIB VS AFLUTTER PER DR HILTY.  AT PRESENT TO CONTINUE TO MONITOR- AND FOLLOW APPT WITH DR BERRY - TO F/U MYOVIEW AND MONITOR.   RN SPOKE TO PATIENT - PATIENT AWARE OF  INSTRUCTIONS. PATIENT STATES HE FELT SYMPTOMS  PRIOR TO WAKING UP THIS MORNING AND LASTED UNTIL@ NOON.  BLOOD PRESSURE 133/83 .  PATIENT AWARE TO  MANUAL TRIGGER MONITOR IF SYMPTOMS RETURN AND  PATIENT STATES HE WILL MAIL IT ON Monday 12/01/17 . PATIENT HAS A MYOVIEW ON 11/191/19  SCHEDULE APPT 12/10/17 AT 3:15 PM W/DR Gwenlyn Found

## 2017-11-25 NOTE — Telephone Encounter (Signed)
New message   Patient c/o Palpitations:  High priority if patient c/o lightheadedness, shortness of breath, or chest pain  1) How long have you had palpitations/irregular HR/ Afib? Are you having the symptoms now? Yes, started today  2) Are you currently experiencing lightheadedness, SOB or CP? No   3) Do you have a history of afib (atrial fibrillation) or irregular heart rhythm? yes  4) Have you checked your BP or HR? (document readings if available): no   5) Are you experiencing any other symptoms? No

## 2017-11-27 ENCOUNTER — Telehealth (HOSPITAL_COMMUNITY): Payer: Self-pay

## 2017-11-27 NOTE — Telephone Encounter (Signed)
Encounter complete. 

## 2017-12-02 ENCOUNTER — Ambulatory Visit (HOSPITAL_COMMUNITY)
Admission: RE | Admit: 2017-12-02 | Discharge: 2017-12-02 | Disposition: A | Discharge status: Home / Self Care | Payer: PPO | Source: Ambulatory Visit | Attending: Cardiovascular Disease | Admitting: Cardiovascular Disease

## 2017-12-02 DIAGNOSIS — I471 Supraventricular tachycardia, unspecified: Secondary | ICD-10-CM

## 2017-12-02 DIAGNOSIS — I351 Nonrheumatic aortic (valve) insufficiency: Secondary | ICD-10-CM

## 2017-12-02 DIAGNOSIS — R002 Palpitations: Secondary | ICD-10-CM

## 2017-12-02 LAB — MYOCARDIAL PERFUSION IMAGING
CHL CUP NUCLEAR SDS: 3
CHL CUP NUCLEAR SRS: 4
CHL CUP RESTING HR STRESS: 59 {beats}/min
CSEPHR: 94 %
CSEPPHR: 139 {beats}/min
Estimated workload: 10.1 METS
Exercise duration (min): 8 min
Exercise duration (sec): 31 s
LV sys vol: 78 mL
LVDIAVOL: 166 mL (ref 62–150)
MPHR: 147 {beats}/min
NUC STRESS TID: 1
RPE: 17
SSS: 7

## 2017-12-02 MED ORDER — TECHNETIUM TC 99M TETROFOSMIN IV KIT
30.1000 | PACK | Freq: Once | INTRAVENOUS | Status: AC | PRN
  Administered 2017-12-02: 30.1 via INTRAVENOUS
  Filled 2017-12-02: qty 31

## 2017-12-02 MED ORDER — TECHNETIUM TC 99M TETROFOSMIN IV KIT
10.4000 | PACK | Freq: Once | INTRAVENOUS | Status: AC | PRN
  Administered 2017-12-02: 10.4 via INTRAVENOUS
  Filled 2017-12-02: qty 11

## 2017-12-10 ENCOUNTER — Encounter: Payer: Self-pay | Admitting: Cardiovascular Disease

## 2017-12-10 ENCOUNTER — Ambulatory Visit: Payer: PPO | Admitting: Cardiovascular Disease

## 2017-12-10 VITALS — BP 128/76 | HR 55 | Ht 73.0 in | Wt 198.0 lb

## 2017-12-10 DIAGNOSIS — I472 Ventricular tachycardia, unspecified: Secondary | ICD-10-CM

## 2017-12-10 DIAGNOSIS — Q231 Congenital insufficiency of aortic valve: Secondary | ICD-10-CM | POA: Insufficient documentation

## 2017-12-10 DIAGNOSIS — I712 Thoracic aortic aneurysm, without rupture, unspecified: Secondary | ICD-10-CM | POA: Insufficient documentation

## 2017-12-10 DIAGNOSIS — Q2381 Bicuspid aortic valve: Secondary | ICD-10-CM | POA: Insufficient documentation

## 2017-12-10 NOTE — Assessment & Plan Note (Signed)
Recent CTA performed 10/31/2017 revealed an ascending thoracic aortic aneurysm measuring 4.9 cm.  Based on this, I am referring him to TCT S for surgical evaluation.

## 2017-12-10 NOTE — Patient Instructions (Signed)
Medication Instructions:  NONE If you need a refill on your cardiac medications before your next appointment, please call your pharmacy.   Lab work: NONE If you have labs (blood work) drawn today and your tests are completely normal, you will receive your results only by: Marland Kitchen MyChart Message (if you have MyChart) OR . A paper copy in the mail If you have any lab test that is abnormal or we need to change your treatment, we will call you to review the results.  Testing/Procedures: NONE  Referrals You have been referred to Cardiac Electrophysiology for further evaluation of arrythmia. SCHEDULE WITHIN 2 WEEKS  You have been referred to Cardiothoracic Surgery for further evaluation of thoracic aortic aneurysm. SCHEDULE WITHIN 3 WEEKS  Follow-Up: At [facility], you and your health needs are our priority.  As part of our continuing mission to provide you with exceptional heart care, we have created designated Provider Care Teams.  These Care Teams include your primary Cardiologist (physician) and Advanced Practice Providers (APPs -  Physician Assistants and Nurse Practitioners) who all work together to provide you with the care you need, when you need it. You will need a follow up appointment in 3 months WITH DR. Gwenlyn Found. PLEASE CALL ONE MONTH IN ADVANCE TO SCHEDULE.

## 2017-12-10 NOTE — Assessment & Plan Note (Signed)
History of palpitations with recent event monitor that showed sinus rhythm, sinus tachycardia, severe sinus bradycardia with heart rates in the 40s and 30s especially in the morning hours as well as short runs of atrial fibrillation and longer runs of nonsustained ventricular tachycardia.  Based on this, I am going to refer him to the electrophysiologist for further evaluation.

## 2017-12-10 NOTE — Assessment & Plan Note (Signed)
Jerry Velazquez has a bicuspid aortic valve with mild aortic stenosis and mild to moderate AI.

## 2017-12-10 NOTE — Progress Notes (Signed)
Mr. Grime returns today for follow-up of his noninvasive test.  A Myoview stress test was low risk and nonischemic.  2D echo revealed mild aortic stenosis, mild to moderate aortic insufficiency with a bicuspid aortic valve.  A Myoview stress test was low risk and nonischemic.  A thoracic CTA did reveal a moderately large thoracic aortic aneurysm measuring 4.9 cm.  An event monitor showed short runs of A. fib, sinus rhythm and sinus bradycardia as well as runs of nonsustained ventricular tachycardia.  And confirmed to electrophysiology as well as cardiothoracic surgery for further evaluation and I will see him back in 3 months for follow-up.  Lorretta Harp, M.D., [place], [facility], [place] [address] [place], [place]  [id number]  [phone] 12/10/2017 4:00 PM

## 2017-12-15 ENCOUNTER — Encounter: Payer: Self-pay | Admitting: Internal Medicine

## 2017-12-15 ENCOUNTER — Ambulatory Visit: Payer: PPO | Admitting: Internal Medicine

## 2017-12-15 VITALS — BP 144/78 | HR 54 | Ht 73.0 in | Wt 199.4 lb

## 2017-12-15 DIAGNOSIS — I472 Ventricular tachycardia, unspecified: Secondary | ICD-10-CM

## 2017-12-15 DIAGNOSIS — Q231 Congenital insufficiency of aortic valve: Secondary | ICD-10-CM | POA: Diagnosis not present

## 2017-12-15 DIAGNOSIS — I480 Paroxysmal atrial fibrillation: Secondary | ICD-10-CM

## 2017-12-15 DIAGNOSIS — R0683 Snoring: Secondary | ICD-10-CM | POA: Diagnosis not present

## 2017-12-15 MED ORDER — METOPROLOL TARTRATE 25 MG PO TABS: 25.0000 mg | ORAL_TABLET | Freq: Four times a day (QID) | ORAL | 1 refills | Status: DC | PRN

## 2017-12-15 NOTE — Progress Notes (Signed)
Electrophysiology Office Note   Date:  12/15/2017   ID:  Jerry Velazquez, Jerry Velazquez 09/02/1945, MRN [id number]  PCP:  [name], PA-C  Cardiologist:  Dr Gwenlyn Found Primary Electrophysiologist: Thompson Grayer, MD    CC: afib   History of Present Illness: Jerry Velazquez is a 72 y.o. male who presents today for electrophysiology evaluation.   He is referred today by Dr Gwenlyn Found for EP consultation regarding atrial arrhythmias and NSVT. He reports having palpitations for about 6 months.  He was evaluated by PCP and noted to have a loud aortic murmur.  He was referred to Dr Gwenlyn Found.  He has been found to have AI/AS with bicuspid aortic valve and aortic aneurysm.  He has also been observed to have afib/ atrial flutter and NSVT noted on monitor. He has sinus bradycardia with more noticeable nocturnal bradycardia.  He is asymptomatic with this. He feels that his afib is well controlled.  ETOH avoidance has lead to a reduction in palpitations.  He does not feel that his AF is progressing at this time.  Today, he denies symptoms of chest pain, shortness of breath, orthopnea, PND, lower extremity edema, claudication, dizziness, presyncope, syncope, bleeding, or neurologic sequela. The patient is tolerating medications without difficulties and is otherwise without complaint today.    Past Medical History:  Diagnosis Date  . Aortic aneurysm ([place])   . Aortic insufficiency due to bicuspid aortic valve   . Aortic stenosis due to bicuspid aortic valve   . Atrial flutter ([place])   . Bicuspid aortic valve   . GERD (gastroesophageal reflux disease)    past hx- stopped alcohol and no more issues   . NSVT (nonsustained ventricular tachycardia) ([place])   . Paroxysmal atrial fibrillation ([facility])   . PVC's (premature ventricular contractions)   . RBBB    Past Surgical History:  Procedure Laterality Date  . COLONOSCOPY     12 yrs ago [facility]- normal per pt   . CYST EXCISION     left knee   . CYST EXCISION     tongue      Current Outpatient Medications  Medication Sig Dispense Refill  . Cholecalciferol (HM VITAMIN D3) 4000 units CAPS Take 4,000 Units by mouth daily.    . Cyanocobalamin (VITAMIN B-12 CR) 1500 MCG TBCR Take 1 tablet by mouth daily.    Marland Kitchen GLUCOSAMINE HCL PO Take 2,000 mg by mouth daily.    . Omega-3 Fatty Acids (FISH OIL) 1000 MG CAPS Take by mouth.    . metoprolol tartrate (LOPRESSOR) 25 MG tablet Take 1 tablet (25 mg total) by mouth every 6 (six) hours as needed (palpitations). 30 tablet 1   No current facility-administered medications for this visit.     Allergies:   Patient has no known allergies.   Social History:  The patient  reports that he has quit smoking. His smoking use included pipe. He quit after 18.00 years of use. He has never used smokeless tobacco. He reports that he drank alcohol. He reports that he does not use drugs.   Family History:  The patient's  family history includes COPD in his brother and sister; Colon polyps in his sister; Heart disease in his mother; Hypertension in his brother and father; Stroke in his father.    ROS:  Please see the history of present illness.   All other systems are personally reviewed and negative.    PHYSICAL EXAM: VS:  BP (!) 144/78   Pulse (!) 54  Ht 6\' 1"  (1.854 m)   Wt 199 lb 6.4 oz (90.4 kg)   SpO2 98%   BMI 26.31 kg/m  , BMI Body mass index is 26.31 kg/m. GEN: Well nourished, well developed, in no acute distress  HEENT: normal  Neck: no JVD, carotid bruits, or masses Cardiac: RRR; 2/6 SEM LUSB, 1/6 diastolic murmur LUSB Respiratory:  clear to auscultation bilaterally, normal work of breathing GI: soft, nontender, nondistended, + BS MS: no deformity or atrophy  Skin: warm and dry  Neuro:  Strength and sensation are intact Psych: euthymic mood, full affect  EKG:  EKG is ordered today. The ekg ordered today is personally reviewed and shows  Sinus bradycardia 54 bpm, PR 176 msec, QRS 104 msec, QTc 403 msec,  incomplete RBBB, LAD   Recent Labs: 09/03/2017: ALT 12; Hemoglobin 14.0; Platelets 204; TSH 1.47 10/21/2017: BUN 13; Creatinine, Ser 0.75; Potassium 5.3; Sodium 142  personally reviewed   Lipid Panel     Component Value Date/Time   CHOL 160 09/03/2017 0934   TRIG 44 09/03/2017 0934   HDL 44 09/03/2017 0934   CHOLHDL 3.6 09/03/2017 0934   LDLCALC 103 (H) 09/03/2017 0934   personally reviewed   Wt Readings from Last 3 Encounters:  12/15/17 199 lb 6.4 oz (90.4 kg)  12/10/17 198 lb (89.8 kg)  12/02/17 202 lb (91.6 kg)      Other studies personally reviewed: Additional studies/ records that were reviewed today include: Dr Kennon Holter notes, prior stress test, echo, event monitor  Review of the above records today demonstrates: as above   ASSESSMENT AND PLAN:  1.  Paroxysmal atrial fibrillation/ atrial flutter Episodes are relatively infrequent and controlled Sinus bradycardia significant limits our treatment options.   We discussed tikosyn.  He would like to avoid this presently.  I will given metoprolol 25mg  q6 prn palpitations. chads2vasc score is 2 (age, vascular disease).  We discussed anticoagulation at length today.  At this time, he is resistant to proceed with [facility] therapy. No changes for now. If he does have aortic valve surgery with aortic graft, MAZE and LAA amputation would be an ideal treatment for his AF.  Given the complexity of his aortic disease, this extensive add on to his procedure may not be feasible.  I will defer to the thoracic surgery team.  2. NSVT asymtomatic Normal EF No workup planned  3. Sinus bradycardia Asymptomatic No indication for pacing at this time He did have more pronounced nocturnal bradycardia.  He snores.  I have advised sleep study at this time.  4. Borderline HTN bp elevated today.  He is clear that he does not currently carry a diagnosis of HTN.    Follow-up:  AF clinic in 6 weeks I will see when needed going forward  Current  medicines are reviewed at length with the patient today.   The patient does not have concerns regarding his medicines.  The following changes were made today:  none  Labs/ tests ordered today include:  Orders Placed This Encounter  Procedures  . EKG 12-Lead  . Split night study     Signed, Thompson Grayer, MD  12/15/2017 1:08 PM     [address] [place] [address] [phone] (office) [phone] (fax)

## 2017-12-15 NOTE — Patient Instructions (Addendum)
Medication Instructions:  Your physician has recommended you make the following change in your medication:  1. TAKE  Metoprolol Tartrate (Lopressor) 25 mg every 6 hours as needed for palpitations  If you need a refill on your cardiac medications before your next appointment, please call your pharmacy.   Lab work: None ordered  Testing/Procedures: Your physician has recommended that you have a sleep study. This test records several body functions during sleep, including: brain activity, eye movement, oxygen and carbon dioxide blood levels, heart rate and rhythm, breathing rate and rhythm, the flow of air through your mouth and nose, snoring, body muscle movements, and chest and belly movement.  Follow-Up: Your physician recommends that you schedule a follow-up appointment in: 6 weeks with Roderic Palau, NP in the AFib clinic.  Thank you for choosing [facility]!!    Any Other Special Instructions Will Be Listed Below (If Applicable).

## 2017-12-15 NOTE — Addendum Note (Signed)
Addended by: Annita Brod on: 12/15/2017 08:15 AM   Modules accepted: Orders

## 2017-12-16 ENCOUNTER — Telehealth: Payer: Self-pay | Admitting: *Deleted

## 2017-12-16 NOTE — Telephone Encounter (Signed)
-----   Message from Stanton Kidney, RN sent at 12/15/2017  1:32 PM EST ----- Regarding: sleep study Please pre-cert and schedule sleep study Snoring   thx

## 2017-12-16 NOTE — Telephone Encounter (Signed)
PA submitted to [facility] via web portal for sleep study. 

## 2017-12-16 NOTE — Telephone Encounter (Signed)
Staff message sent to both Gae Bon and Trinidad Curet [facility] denied in lab sleep study. Recommend HST. No PA is required.. Patient has no co morbidities.

## 2017-12-24 ENCOUNTER — Telehealth: Payer: Self-pay | Admitting: *Deleted

## 2017-12-24 DIAGNOSIS — R0683 Snoring: Secondary | ICD-10-CM

## 2017-12-24 NOTE — Telephone Encounter (Signed)
Lauralee Evener, CMA  Freada Bergeron, CMA Cc: Stanton Kidney, RN        Per Jackelyn Poling with [facility] patient does not qualify to have a in lab sleep study. He has to have a HST. Please inform ordering provider and order.

## 2017-12-24 NOTE — Telephone Encounter (Signed)
-----   Message from Lauralee Evener, [place] sent at 12/16/2017  2:31 PM EST ----- Regarding: RE: sleep study Per Debbie with [facility] patient does not qualify to have a in lab sleep study. He has to have a HST. Please inform ordering provider and order. ----- Message ----- From: Stanton Kidney, RN Sent: 12/15/2017   1:32 PM EST To: Damian Leavell, RN, [facility] Sleep Studies Subject: sleep study                                    Please pre-cert and schedule sleep study Snoring   thx

## 2017-12-31 ENCOUNTER — Institutional Professional Consult (permissible substitution): Payer: PPO | Admitting: Surgery

## 2017-12-31 ENCOUNTER — Encounter: Payer: Self-pay | Admitting: Surgery

## 2017-12-31 ENCOUNTER — Other Ambulatory Visit: Payer: Self-pay

## 2017-12-31 VITALS — BP 146/74 | HR 56 | Resp 16 | Ht 73.0 in | Wt 204.0 lb

## 2017-12-31 DIAGNOSIS — I712 Thoracic aortic aneurysm, without rupture, unspecified: Secondary | ICD-10-CM

## 2017-12-31 DIAGNOSIS — Q231 Congenital insufficiency of aortic valve: Secondary | ICD-10-CM

## 2018-01-01 ENCOUNTER — Encounter: Payer: Self-pay | Admitting: Surgery

## 2018-01-01 NOTE — Progress Notes (Signed)
PCP is [name], PA-C Referring Provider is Lorretta Harp, MD  Chief Complaint  Patient presents with  . Thoracic Aortic Aneurysm    new patient consultation, CTA chest 10/31/17, ECHO 10/21/17    HPI:  The patient is a 72 year old gentleman with a history of palpitations for about 6 months.  He was evaluated by his PCP and noted to have a heart murmur.  He underwent echocardiogram on 09/09/2017 which showed a normal left ventricular ejection fraction of 55 to 60%.  He had a bicuspid aortic valve with mildly thickened and calcified leaflets.  There is a mean gradient of 16 mmHg consistent with mild to moderate aortic stenosis.  There is mild to moderate aortic insufficiency directed eccentrically into the left ventricular outflow tract towards the anterior mitral leaflet.  There is no mitral regurgitation.  He was referred to Dr. Quay Burow.  He underwent cardiac telemetry monitoring which showed sinus rhythm with some sinus bradycardia.  There were infrequent runs of PAF/PA flutter.  There were some nonsustained ventricular tachycardia.  A CT angiogram of the chest was performed since the aorta appeared enlarged on echocardiogram.  This showed the maximum diameter at the sinus of Valsalva level to be 4.9 cm.  The sinotubular junction was 3.9 cm.  The mid ascending aorta was 4.8 cm.  The proximal aortic arch was 3.6 cm.  The distal ascending aorta was 2.6 cm.  He underwent a nuclear stress test which showed an ejection fraction of 53%.  There were no ST segment changes during stress.  There is a fixed medium defect of moderate severity in the basal inferoseptal, mid inferior septal, apical septal, and apical location that was felt the most consistent with artifact.  This was a low risk study. He was referred to electrophysiology and seen by Dr. [name].  The patient said that he feels well overall.  He denies any chest pain or pressure.  Occasional shortness of breath with exertion.  He  has had no orthopnea or PND.  Denies peripheral edema.  He has had no dizziness or syncope.  He has occasional palpitations.     Past Medical History:  Diagnosis Date  . Aortic aneurysm ([place])   . Aortic insufficiency due to bicuspid aortic valve   . Aortic stenosis due to bicuspid aortic valve   . Atrial flutter ([place])   . Bicuspid aortic valve   . GERD (gastroesophageal reflux disease)    past hx- stopped alcohol and no more issues   . NSVT (nonsustained ventricular tachycardia) ([place])   . Paroxysmal atrial fibrillation ([facility])   . PVC's (premature ventricular contractions)   . RBBB     Past Surgical History:  Procedure Laterality Date  . COLONOSCOPY     12 yrs ago [facility]- normal per pt   . CYST EXCISION     left knee   . CYST EXCISION     tongue    Family History  Problem Relation Age of Onset  . Heart disease Mother   . Hypertension Father   . Stroke Father   . COPD Sister   . Colon polyps Sister   . Hypertension Brother   . COPD Brother   . Colon cancer Neg Hx   . Esophageal cancer Neg Hx   . Rectal cancer Neg Hx   . Stomach cancer Neg Hx     Social History Social History   Tobacco Use  . Smoking status: Former Smoker    Years:  18.00    Types: Pipe  . Smokeless tobacco: Never Used  . Tobacco comment: smoked a pipe  Substance Use Topics  . Alcohol use: Not Currently  . Drug use: Never    Current Outpatient Medications  Medication Sig Dispense Refill  . Cholecalciferol (HM VITAMIN D3) 4000 units CAPS Take 4,000 Units by mouth daily.    . Cyanocobalamin (VITAMIN B-12 CR) 1500 MCG TBCR Take 1 tablet by mouth daily.    Marland Kitchen GLUCOSAMINE HCL PO Take 2,000 mg by mouth daily.    . metoprolol tartrate (LOPRESSOR) 25 MG tablet Take 1 tablet (25 mg total) by mouth every 6 (six) hours as needed (palpitations). (Patient taking differently: Take 25 mg by mouth every 6 (six) hours as needed (palpitations.Marland KitchenMarland KitchenRARELY USED). ) 30 tablet 1  . Omega-3 Fatty Acids (FISH OIL) 1000  MG CAPS Take by mouth.     No current facility-administered medications for this visit.     No Known Allergies  Review of Systems  Constitutional: Negative for activity change and fatigue.  HENT: Negative.   Eyes: Negative.   Respiratory: Positive for shortness of breath.   Cardiovascular: Positive for palpitations. Negative for chest pain and leg swelling.  Gastrointestinal: Negative.   Genitourinary: Negative.   Musculoskeletal: Negative.   Skin: Negative.   Neurological: Negative.  Negative for dizziness and syncope.  Psychiatric/Behavioral: Negative.     BP (!) 146/74 (BP Location: Left Arm, Patient Position: Sitting, Cuff Size: Large)   Pulse (!) 56   Resp 16   Ht 6\' 1"  (1.854 m)   Wt 204 lb (92.5 kg)   SpO2 97% Comment: RA  BMI 26.91 kg/m  Physical Exam Constitutional:      Appearance: Normal appearance.  HENT:     Head: Normocephalic and atraumatic.     Mouth/Throat:     Mouth: Mucous membranes are moist.  Eyes:     Extraocular Movements: Extraocular movements intact.     Conjunctiva/sclera: Conjunctivae normal.     Pupils: Pupils are equal, round, and reactive to light.  Neck:     Musculoskeletal: Normal range of motion and neck supple.     Vascular: No carotid bruit.  Cardiovascular:     Rate and Rhythm: Normal rate and regular rhythm.     Pulses: Normal pulses.     Heart sounds: Murmur present. Systolic murmur present with a grade of 2/6.  Pulmonary:     Effort: Pulmonary effort is normal.     Breath sounds: Normal breath sounds.  Abdominal:     General: Abdomen is flat.     Palpations: Abdomen is soft.  Musculoskeletal: Normal range of motion.        General: No swelling.  Lymphadenopathy:     Cervical: No cervical adenopathy.  Skin:    General: Skin is warm and dry.  Neurological:     General: No focal deficit present.     Mental Status: He is alert.  Psychiatric:        Mood and Affect: Mood normal.        Behavior: Behavior normal.       Diagnostic Tests:                          [facility]                        [address]  [place], [place] [address]                            [phone]  ------------------------------------------------------------------- Transthoracic Echocardiography  Patient:    Sael, Furches MR #:       [id number] Study Date: 09/09/2017 Gender:     M Age:        86 Height:     186.7 cm Weight:     91.6 kg BSA:        2.19 m^2 Pt. Status: Room:   SONOGRAPHER  Wyatt Mage, [place], Outpatient  ATTENDING    Terrilee Croak, [place]  REFERRING    [name]  cc:  ------------------------------------------------------------------- LV EF: 55% -   60%  ------------------------------------------------------------------- Indications:      Palpitation (R00.2).  ------------------------------------------------------------------- History:   Risk factors:  Former tobacco use.  ------------------------------------------------------------------- Study Conclusions  - Left ventricle: The cavity size was normal. Wall thickness was   normal. Systolic function was normal. The estimated ejection   fraction was in the range of 55% to 60%. Wall motion was normal;   there were no regional wall motion abnormalities. Left   ventricular diastolic function parameters were normal. - Aortic valve: Bicuspid; mildly thickened, mildly calcified   leaflets. There was mild stenosis. There was mild to moderate   regurgitation directed eccentrically in the LVOT and towards the   mitral anterior leaflet. - Aortic root: The aortic root was moderately dilated. - Mitral valve: There was mild systolic anterior motion of the   chordal structures, but there is no outflow tract obstruction.. - Right ventricle: The cavity size was mildly dilated.  Recommendations:  Consider more accurate measurement of the aortic root/ascending  aorta with CT.  ------------------------------------------------------------------- Study data:  No prior study was available for comparison.  Study status:  Routine.  Procedure:  The patient reported no pain pre or post test. Transthoracic echocardiography. Image quality was adequate.  Study completion:  There were no complications. Transthoracic echocardiography.  M-mode, complete 2D, 3D, spectral Doppler, and color Doppler.  Birthdate:  Patient birthdate: 1945/09/04.  Age:  Patient is 72 yr old.  Sex:  Gender: male. BMI: 26.3 kg/m^2.  Blood pressure:     146/70  Patient status: Outpatient.  Study date:  Study date: 09/09/2017. Study time: 01:38 PM.  Location:  [facility]  -------------------------------------------------------------------  ------------------------------------------------------------------- Left ventricle:  The cavity size was normal. Wall thickness was normal. Systolic function was normal. The estimated ejection fraction was in the range of 55% to 60%. Wall motion was normal; there were no regional wall motion abnormalities. The transmitral flow pattern was normal. The deceleration time of the early transmitral flow velocity was normal. The pulmonary vein flow pattern was normal. The tissue Doppler parameters were normal. Left ventricular diastolic function parameters were normal.  ------------------------------------------------------------------- Aortic valve:   Bicuspid; mildly thickened, mildly calcified leaflets.  Doppler:   There was mild stenosis.   There was mild to moderate regurgitation directed eccentrically in the LVOT and towards the mitral anterior leaflet.    VTI ratio of LVOT to aortic valve: 0.47. Peak velocity ratio of LVOT to aortic valve: 0.41. Mean velocity ratio of LVOT to aortic valve: 0.48.    Mean gradient (S): 16 mm Hg. Peak gradient (S): 29 mm Hg.  ------------------------------------------------------------------- Aorta:   Aortic root: The aortic root was moderately dilated.  ------------------------------------------------------------------- Mitral  valve:   Structurally normal valve.   Leaflet separation was normal. There was mild systolic anterior motion of the chordal structures, but there is no outflow tract obstruction..  Doppler: Transvalvular velocity was within the normal range. There was no evidence for stenosis. There was no regurgitation.    Valve area by pressure half-time: 2.42 cm^2. Indexed valve area by pressure half-time: 1.11 cm^2/m^2.  ------------------------------------------------------------------- Left atrium:  The atrium was normal in size.  ------------------------------------------------------------------- Right ventricle:  The cavity size was mildly dilated. Systolic function was normal.  ------------------------------------------------------------------- Pulmonic valve:   Poorly visualized.  The valve appears to be grossly normal.   Cusp separation was normal.  Doppler: Transvalvular velocity was within the normal range. There was no regurgitation.  ------------------------------------------------------------------- Tricuspid valve:   Structurally normal valve.   Leaflet separation was normal.  Doppler:  Transvalvular velocity was within the normal range. There was no regurgitation.  ------------------------------------------------------------------- Pulmonary artery:   Systolic pressure was within the normal range.   ------------------------------------------------------------------- Right atrium:  The atrium was normal in size.  ------------------------------------------------------------------- Pericardium:  There was no pericardial effusion.  ------------------------------------------------------------------- Systemic veins: Inferior vena cava: The vessel was normal in size. The respirophasic diameter changes were in the normal range (>= 50%), consistent  with normal central venous pressure.  ------------------------------------------------------------------- Measurements   Left ventricle                           Value           Reference  LV ID, ED, PLAX chordal                  49     mm       43 - 52  LV ID, ES, PLAX chordal                  33     mm       23 - 38  LV fx shortening, PLAX chordal           33     %        >=29  LV PW thickness, ED                      9      mm       ---------  IVS/LV PW ratio, ED                      1.11            <=1.3  LV e&', lateral                           10.3   cm/s     ---------  LV E/e&', lateral                         4.24            ---------  LV e&', medial                            8.41   cm/s     ---------  LV E/e&', medial  5.2             ---------  LV e&', average                           9.36   cm/s     ---------  LV E/e&', average                         4.67            ---------    Ventricular septum                       Value           Reference  IVS thickness, ED                        10     mm       ---------    LVOT                                     Value           Reference  LVOT peak velocity, S                    109    cm/s     ---------  LVOT mean velocity, S                    89.4   cm/s     ---------  LVOT VTI, S                              29.2   cm       ---------    Aortic valve                             Value           Reference  Aortic valve peak velocity, S            269    cm/s     ---------  Aortic valve mean velocity, S            186    cm/s     ---------  Aortic valve VTI, S                      61.7   cm       ---------  Aortic mean gradient, S                  16     mm Hg    ---------  Aortic peak gradient, S                  29     mm Hg    ---------  VTI ratio, LVOT/AV                       0.47            ---------  Velocity ratio, peak, LVOT/AV            0.41            ---------  Velocity ratio, mean,  LVOT/AV            0.48            ---------  Aortic regurg pressure half-time         634    ms       ---------    Aorta                                    Value           Reference  Aortic root ID, ED                       51     mm       ---------  Ascending aorta ID, A-P, S               49     mm       ---------    Left atrium                              Value           Reference  LA ID, A-P, ES                           27     mm       ---------  LA ID/bsa, A-P                           1.23   cm/m^2   <=2.2  LA volume, S                             34.4   ml       ---------  LA volume/bsa, S                         15.7   ml/m^2   ---------  LA volume, ES, 1-p A4C                   37.5   ml       ---------  LA volume/bsa, ES, 1-p A4C               17.1   ml/m^2   ---------  LA volume, ES, 1-p A2C                   31.3   ml       ---------  LA volume/bsa, ES, 1-p A2C               14.3   ml/m^2   ---------    Mitral valve                             Value           Reference  Mitral E-wave peak velocity              43.7   cm/s     ---------  Mitral A-wave peak velocity              41.8  cm/s     ---------  Mitral deceleration time         (H)     310    ms       150 - 230  Mitral pressure half-time                91     ms       ---------  Mitral E/A ratio, peak                   1               ---------  Mitral valve area, PHT, DP               2.42   cm^2     ---------  Mitral valve area/bsa, PHT, DP           1.11   cm^2/m^2 ---------    Pulmonary arteries                       Value           Reference  PA pressure, S, DP                       21     mm Hg    <=30    Tricuspid valve                          Value           Reference  Tricuspid regurg peak velocity           212.01 cm/s     ---------  Tricuspid peak RV-RA gradient            18     mm Hg    ---------  Tricuspid maximal regurg                 212.01 cm/s     ---------  velocity, PISA    Systemic  veins                           Value           Reference  Estimated CVP                            3      mm Hg    ---------    Right ventricle                          Value           Reference  RV ID, minor axis, ED, A4C       (H)     44.77  mm       26 - 43  TAPSE                                    31.3   mm       ---------  RV pressure, S, DP                       21     mm Hg    <=30  RV s&', lateral, S                        9.99   cm/s     ---------  Legend: (L)  and  (H)  mark values outside specified reference range.  ------------------------------------------------------------------- Prepared and Electronically Authenticated by  Sanda Klein, MD 2019-08-27T15:28:47  CLINICAL DATA:  Eval thoracic aortic aneurysm. Paroxsymal supraventricular tachycardia.  EXAM: CT ANGIOGRAPHY CHEST WITH CONTRAST  TECHNIQUE: Multidetector CT imaging of the chest was performed using the standard protocol during bolus administration of intravenous contrast. Multiplanar CT image reconstructions and MIPs were obtained to evaluate the vascular anatomy.  CONTRAST:  14mL ISOVUE-370 IOPAMIDOL (ISOVUE-370) INJECTION 76%  COMPARISON:  None.  FINDINGS: Cardiovascular: Heart size normal. No pericardial effusion. Satisfactory opacification of pulmonary arteries noted, and there is no evidence of pulmonary emboli. Heavy aortic leaflet calcification. Good contrast opacification of the thoracic aorta. Transverse dimensions as follows:  4.9 cm sinuses of Valsalva  3.9 cm sino-tubular junction  4.1 Cm proximal ascending  4.8 cm mid ascending  3.6 cm distal ascending/proximal arch  3.4 cm distal arch  3.5 cm proximal descending  2.6 cm distal descending  No dissection or stenosis. Classic 3 vessel brachiocephalic arterial origin anatomy without proximal stenosis. Minimal scattered calcified plaque in the arch and descending thoracic segment. Visualized proximal  abdominal aorta unremarkable.  Mediastinum/Nodes: No hilar or mediastinal adenopathy.  Lungs/Pleura: No pleural effusion. No pneumothorax. Lungs are clear.  Upper Abdomen: Low-attenuation scattered hepatic lesions statistically most likely cysts in the absence of a history of primary carcinoma. No acute findings.  Musculoskeletal: Mild thoracic dextroscoliosis with multilevel spondylitic change. No fracture or worrisome bone lesion.  Review of the MIP images confirms the above findings.  IMPRESSION: 1. 4.8 cm ascending thoracic aortic aneurysm without complicating features. Recommend semi-annual imaging followup by CTA or MRA and referral to cardiothoracic surgery if not already obtained. This recommendation follows 2010 ACCF/AHA/AATS/ACR/ASA/SCA/SCAI/SIR/STS/SVM Guidelines for the Diagnosis and Management of Patients With Thoracic Aortic Disease. Circulation. 2010; 121: W737-T062   Electronically Signed   By: Lucrezia Europe M.D.   On: 10/31/2017 14:41   Impression:  This 72 year old gentleman has a bicuspid aortic valve with moderate aortic stenosis and mild to moderate aortic insufficiency as well as a 4.9 cm aortic root at the sinus level and 4.8 cm fusiform ascending aortic aneurysm.  His left ventricular ejection fraction is 55 to 60% and left ventricular internal dimensions are within normal limits.  He has occasional shortness of breath with exertion.  His aortic valve stenosis and regurgitation is not severe enough to cause symptoms with a mean aortic valve gradient of 16 mmHg and an aortic regurgitation pressure half-time of 634 ms.  I do not think there is any indication for aortic valve replacement at this time.  His aortic root and ascending aorta are aneurysmal but still below the 5 cm surgical threshold for patients with bicuspid aortic valve disease.  I reviewed the echocardiogram and CT angiogram studies with him and his family and answered their questions.  I  have recommended that he have a follow-up CTA of the chest in 6 months and should have a follow-up echocardiogram within a year.  I stressed the importance of good blood pressure control and preventing further enlargement of his aorta and aortic dissection.  I told him that he can remain physically active but should avoid heavy lifting of more than 35 pounds.   Plan:  I will  plan to see him back in 6 months with a CTA of the chest.  He will continue to follow-up with Dr. Quay Burow who will decide when to repeat his echocardiogram.  I spent 45 minutes performing this consultation and > 50% of this time was spent face to face counseling and coordinating the care of this patient's bicuspid aortic valve and ascending aortic aneurysm.   Gaye Pollack, MD [facility] [phone]

## 2018-01-28 ENCOUNTER — Ambulatory Visit (HOSPITAL_COMMUNITY)
Admission: RE | Admit: 2018-01-28 | Discharge: 2018-01-28 | Disposition: A | Discharge status: Home / Self Care | Payer: PPO | Source: Ambulatory Visit | Attending: Nurse Practitioner | Admitting: Nurse Practitioner

## 2018-01-28 ENCOUNTER — Encounter (HOSPITAL_COMMUNITY): Payer: Self-pay | Admitting: Nurse Practitioner

## 2018-01-28 VITALS — BP 144/78 | HR 54 | Ht 73.0 in | Wt 201.6 lb

## 2018-01-28 DIAGNOSIS — I4892 Unspecified atrial flutter: Secondary | ICD-10-CM | POA: Diagnosis not present

## 2018-01-28 DIAGNOSIS — Z87891 Personal history of nicotine dependence: Secondary | ICD-10-CM | POA: Diagnosis not present

## 2018-01-28 DIAGNOSIS — R9431 Abnormal electrocardiogram [ECG] [EKG]: Secondary | ICD-10-CM | POA: Diagnosis not present

## 2018-01-28 DIAGNOSIS — I493 Ventricular premature depolarization: Secondary | ICD-10-CM | POA: Insufficient documentation

## 2018-01-28 DIAGNOSIS — I480 Paroxysmal atrial fibrillation: Secondary | ICD-10-CM | POA: Diagnosis not present

## 2018-01-28 DIAGNOSIS — Z8249 Family history of ischemic heart disease and other diseases of the circulatory system: Secondary | ICD-10-CM | POA: Insufficient documentation

## 2018-01-28 DIAGNOSIS — I4510 Unspecified right bundle-branch block: Secondary | ICD-10-CM | POA: Insufficient documentation

## 2018-01-28 DIAGNOSIS — I517 Cardiomegaly: Secondary | ICD-10-CM | POA: Insufficient documentation

## 2018-01-28 DIAGNOSIS — I472 Ventricular tachycardia: Secondary | ICD-10-CM | POA: Diagnosis not present

## 2018-01-28 DIAGNOSIS — I350 Nonrheumatic aortic (valve) stenosis: Secondary | ICD-10-CM | POA: Diagnosis not present

## 2018-01-28 DIAGNOSIS — I719 Aortic aneurysm of unspecified site, without rupture: Secondary | ICD-10-CM | POA: Insufficient documentation

## 2018-01-28 DIAGNOSIS — I351 Nonrheumatic aortic (valve) insufficiency: Secondary | ICD-10-CM | POA: Diagnosis not present

## 2018-01-28 DIAGNOSIS — Z79899 Other long term (current) drug therapy: Secondary | ICD-10-CM | POA: Diagnosis not present

## 2018-01-28 DIAGNOSIS — R030 Elevated blood-pressure reading, without diagnosis of hypertension: Secondary | ICD-10-CM | POA: Insufficient documentation

## 2018-01-28 DIAGNOSIS — Z7901 Long term (current) use of anticoagulants: Secondary | ICD-10-CM | POA: Insufficient documentation

## 2018-01-28 MED ORDER — APIXABAN 5 MG PO TABS: 5.0000 mg | ORAL_TABLET | Freq: Two times a day (BID) | ORAL | 0 refills | Status: DC

## 2018-01-28 MED ORDER — APIXABAN 5 MG PO TABS: 5.0000 mg | ORAL_TABLET | Freq: Two times a day (BID) | ORAL | 6 refills | Status: DC

## 2018-01-28 NOTE — Patient Instructions (Signed)
Start Eliquis 5mg twice a day 

## 2018-01-28 NOTE — Progress Notes (Signed)
Primary Care Physician: [name], PA-C Referring Physician: Dr. [name]  Cardiologist: Dr. [name] is a 73 y.o. male with a h/o atrial arrhythmia's and NSVT, AI/AS, bicuspid aortic valve and aortic aneurysm. He is being followed by Dr. [name]. At the time that [name] saw the pt, he felt the arrhythmia's were better controlled with alcohol and caffeine avoidance. He also was noted to have sinus brady and more so nocturnal bradycardia, limiting AAD options. He had a CHA2DS2VASc score of 2, anticoagulation was recommended but pt was not ready to start.  In the Afib clinic, still reports that he notices very little irregular heart beat, sometimes very early in the am when he first wakes up.  Discussed anticoagulation guidelines again and he is now ready to start.  Today, he denies symptoms of palpitations, chest pain, shortness of breath, orthopnea, PND, lower extremity edema, dizziness, presyncope, syncope, or neurologic sequela. The patient is tolerating medications without difficulties and is otherwise without complaint today.   Past Medical History:  Diagnosis Date  . Aortic aneurysm ([place])   . Aortic insufficiency due to bicuspid aortic valve   . Aortic stenosis due to bicuspid aortic valve   . Atrial flutter ([place])   . Bicuspid aortic valve   . GERD (gastroesophageal reflux disease)    past hx- stopped alcohol and no more issues   . NSVT (nonsustained ventricular tachycardia) ([place])   . Paroxysmal atrial fibrillation ([facility])   . PVC's (premature ventricular contractions)   . RBBB    Past Surgical History:  Procedure Laterality Date  . COLONOSCOPY     12 yrs ago [facility]- normal per pt   . CYST EXCISION     left knee   . CYST EXCISION     tongue    Current Outpatient Medications  Medication Sig Dispense Refill  . Cholecalciferol (HM VITAMIN D3) 4000 units CAPS Take 4,000 Units by mouth daily.    . Cyanocobalamin (VITAMIN B-12 CR) 1500 MCG TBCR Take 1 tablet by  mouth daily.    Marland Kitchen GLUCOSAMINE HCL PO Take 2,000 mg by mouth daily.    . metoprolol tartrate (LOPRESSOR) 25 MG tablet Take 1 tablet (25 mg total) by mouth every 6 (six) hours as needed (palpitations). (Patient taking differently: Take 25 mg by mouth every 6 (six) hours as needed (palpitations.Marland KitchenMarland KitchenRARELY USED). ) 30 tablet 1  . Omega-3 Fatty Acids (FISH OIL) 1000 MG CAPS Take by mouth.    Marland Kitchen apixaban (ELIQUIS) 5 MG TABS tablet Take 1 tablet (5 mg total) by mouth 2 (two) times daily. 60 tablet 6   No current facility-administered medications for this encounter.     No Known Allergies  Social History   Socioeconomic History  . Marital status: Married    Spouse name: Not on file  . Number of children: Not on file  . Years of education: Not on file  . Highest education level: Not on file  Occupational History  . 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 Financial resource strain: Not on file  . Food insecurity:    Worry: Not on file    Inability: Not on file  . Transportation needs:    Medical: Not on file    Non-medical: Not on file  Tobacco Use  . Smoking status: Former Smoker    Years: 18.00    Types: Pipe  . Smokeless tobacco: Never Used  . Tobacco comment:  smoked a pipe  Substance and Sexual Activity  . Alcohol use: Not Currently  . Drug use: Never  . Sexual activity: Yes  Lifestyle  . Physical activity:    Days per week: Not on file    Minutes per session: Not on file  . Stress: Not on file  Relationships  . Social connections:    Talks on phone: Not on file    Gets together: Not on file    Attends religious service: Not on file    Active member of club or organization: Not on file    Attends meetings of clubs or organizations: Not on file    Relationship status: Not on file  . Intimate partner violence:    Fear of current or ex partner: Not on file    Emotionally abused: Not on file    Physically abused: Not on file     Forced sexual activity: Not on file  Other Topics Concern  . Not on file  Social History Narrative   Lives in [place]   Retired English as a second language teacher    Family History  Problem Relation Age of Onset  . Heart disease Mother   . Hypertension Father   . Stroke Father   . COPD Sister   . Colon polyps Sister   . Hypertension Brother   . COPD Brother   . Colon cancer Neg Hx   . Esophageal cancer Neg Hx   . Rectal cancer Neg Hx   . Stomach cancer Neg Hx     ROS- All systems are reviewed and negative except as per the HPI above  Physical Exam: Vitals:   01/28/18 1050  BP: (!) 144/78  Pulse: (!) 54  Weight: 91.4 kg  Height: 6\' 1"  (1.854 m)   Wt Readings from Last 3 Encounters:  01/28/18 91.4 kg  12/31/17 92.5 kg  12/15/17 90.4 kg    Labs: Lab Results  Component Value Date   NA 142 10/21/2017   K 5.3 (H) 10/21/2017   CL 104 10/21/2017   CO2 23 10/21/2017   GLUCOSE 82 10/21/2017   BUN 13 10/21/2017   CREATININE 0.75 (L) 10/21/2017   CALCIUM 9.2 10/21/2017   No results found for: INR Lab Results  Component Value Date   CHOL 160 09/03/2017   HDL 44 09/03/2017   LDLCALC 103 (H) 09/03/2017   TRIG 44 09/03/2017     GEN- The patient is well appearing, alert and oriented x 3 today.   Head- normocephalic, atraumatic Eyes-  Sclera clear, conjunctiva pink Ears- hearing intact Oropharynx- clear Neck- supple, no JVP Lymph- no cervical lymphadenopathy Lungs- Clear to ausculation bilaterally, normal work of breathing Heart- Regular rate and rhythm, no murmurs, rubs or gallops, PMI not laterally displaced GI- soft, NT, ND, + BS Extremities- no clubbing, cyanosis, or edema MS- no significant deformity or atrophy Skin- no rash or lesion Psych- euthymic mood, full affect Neuro- strength and sensation are intact  EKG-sinus brady at 54 bpm, PR int 196 bpm, qrs int 102 ms, qtc 402 ms    Assessment and Plan: 1. Paroxysmal  afib/ atrial flutter   He feels his afib is well  controlled  Sinus bradycardia significant limits treatment options.   Still wants to avoid Tikosyn Continue current dose of BB  2.Chads2vasc score is 2 (age, vascular disease).  We discussed anticoagulation at length today.  At this time, he is ready to proceed with [facility] therapy Denies bleeding history Bleeding  precautions discussed Will rx eliquis 5  mg bid   3. NSVT asymtomatic Normal EF No workup planned  4. Borderline HTN Bp mildly elevated today.  He is clear that he does not currently carry a diagnosis of HTN.    5. Bicuspid Aorta with AI/AS, aortic aneurysm Per Dr. [name]  F/u here in one month with CBC   Butch Penny C. Shelle Galdamez, [facility] [address] [place], [place] [address] [phone]

## 2018-02-04 ENCOUNTER — Encounter: Payer: PPO | Admitting: Surgery

## 2018-02-09 ENCOUNTER — Telehealth: Payer: Self-pay | Admitting: Family Medicine

## 2018-02-09 NOTE — Telephone Encounter (Signed)
Patient called and left voicemail stating that he is having blood in urine. I called and left voicemail for patient to return call so that we can get him scheduled for an office visit.

## 2018-02-09 NOTE — Telephone Encounter (Signed)
Patient returned call and is scheduled for an office visit on tomorrow.

## 2018-02-10 ENCOUNTER — Ambulatory Visit (INDEPENDENT_AMBULATORY_CARE_PROVIDER_SITE_OTHER): Payer: PPO | Admitting: Family Medicine

## 2018-02-10 ENCOUNTER — Encounter: Payer: Self-pay | Admitting: Family Medicine

## 2018-02-10 VITALS — BP 130/82 | HR 49 | Temp 98.6°F | Resp 15 | Ht 73.5 in | Wt 202.5 lb

## 2018-02-10 DIAGNOSIS — R319 Hematuria, unspecified: Secondary | ICD-10-CM

## 2018-02-10 DIAGNOSIS — E875 Hyperkalemia: Secondary | ICD-10-CM | POA: Diagnosis not present

## 2018-02-10 DIAGNOSIS — Z7901 Long term (current) use of anticoagulants: Secondary | ICD-10-CM | POA: Diagnosis not present

## 2018-02-10 LAB — URINALYSIS, ROUTINE W REFLEX MICROSCOPIC
Bacteria, UA: NONE SEEN /HPF
Bilirubin Urine: NEGATIVE
GLUCOSE, UA: NEGATIVE
Ketones, ur: NEGATIVE
LEUKOCYTES UA: NEGATIVE
Nitrite: NEGATIVE
PH: 6.5 (ref 5.0–8.0)
Protein, ur: NEGATIVE
SQUAMOUS EPITHELIAL / LPF: NONE SEEN /HPF (ref <=5)
Specific Gravity, Urine: 1.01 (ref 1.001–1.03)
WBC UA: NONE SEEN /HPF (ref 0–5)

## 2018-02-10 LAB — COMPLETE METABOLIC PANEL WITH GFR
AG Ratio: 1.3 (calc) (ref 1.0–2.5)
ALBUMIN MSPROF: 3.9 g/dL (ref 3.6–5.1)
ALT: 20 U/L (ref 9–46)
AST: 18 U/L (ref 10–35)
Alkaline phosphatase (APISO): 75 U/L (ref 40–115)
BUN: 13 mg/dL (ref 7–25)
CALCIUM: 9.4 mg/dL (ref 8.6–10.3)
CO2: 27 mmol/L (ref 20–32)
Chloride: 106 mmol/L (ref 98–110)
Creat: 0.76 mg/dL (ref 0.70–1.18)
GFR, EST NON AFRICAN AMERICAN: 91 mL/min/{1.73_m2} (ref >=60)
GFR, Est African American: 105 mL/min/{1.73_m2} (ref >=60)
GLOBULIN: 3 g/dL (ref 1.9–3.7)
Glucose, Bld: 85 mg/dL (ref 65–99)
Potassium: 4.9 mmol/L (ref 3.5–5.3)
SODIUM: 140 mmol/L (ref 135–146)
Total Bilirubin: 0.6 mg/dL (ref 0.2–1.2)
Total Protein: 6.9 g/dL (ref 6.1–8.1)

## 2018-02-10 LAB — MICROSCOPIC MESSAGE

## 2018-02-10 LAB — CBC WITH DIFFERENTIAL/PLATELET
ABSOLUTE MONOCYTES: 559 {cells}/uL (ref 200–950)
BASOS PCT: 1 %
Basophils Absolute: 49 cells/uL (ref 0–200)
EOS ABS: 211 {cells}/uL (ref 15–500)
Eosinophils Relative: 4.3 %
HEMATOCRIT: 43.2 % (ref 38.5–50.0)
Hemoglobin: 14.1 g/dL (ref 13.2–17.1)
Lymphs Abs: 1818 cells/uL (ref 850–3900)
MCH: 29.4 pg (ref 27.0–33.0)
MCHC: 32.6 g/dL (ref 32.0–36.0)
MCV: 90.2 fL (ref 80.0–100.0)
MPV: 11.1 fL (ref 7.5–12.5)
Monocytes Relative: 11.4 %
Neutro Abs: 2264 cells/uL (ref 1500–7800)
Neutrophils Relative %: 46.2 %
Platelets: 199 10*3/uL (ref 140–400)
RBC: 4.79 10*6/uL (ref 4.20–5.80)
RDW: 13 % (ref 11.0–15.0)
TOTAL LYMPHOCYTE: 37.1 %
WBC: 4.9 10*3/uL (ref 3.8–10.8)

## 2018-02-10 LAB — PSA: PSA: 1.1 ng/mL (ref <=4.0)

## 2018-02-10 MED ORDER — CEPHALEXIN 500 MG PO CAPS: 500.0000 mg | ORAL_CAPSULE | Freq: Four times a day (QID) | ORAL | 0 refills | Status: AC

## 2018-02-10 NOTE — Progress Notes (Signed)
Patient ID: Jerry Velazquez, male    DOB: Aug 13, 1945, 73 y.o.   MRN: [id number]  PCP: [name], PA-C  Chief Complaint  Patient presents with  . Hematuria    Patient in with c/o blood in urine, patient states that he passed a clot yesterday. Onset 4 days ago    Subjective:   Jerry Velazquez is a 72 y.o. male, presents to clinic with CC of hematuria x 4 days, started eliquis 3 weeks ago.   4 days ago onset of hematuria, gross hematuria with blood clots and then continued gross hematuria that gradually improved, now there is not blood with every void.  Patient denies any associated urinary urgency, frequency, hesitation, dribbling, double voiding, abdominal pain, flank pain, nausea, vomiting, fever, chills, sweats, testicular pain, scrotal swelling.   Several years ago pt had a UTI, very similar presentation, went to urology for it, it did improve with antibiotics.  He cannot recall who the urologist was but he believes it was a group in [place].  He reports some hx of BPH, no LUTS now, no past meds for it, no past PSA that he remembers. No other bleeding, no bruising, blood with brushing teeth, melena/hematochezia since starting eliquis. He denies any change to his energy, appetite, gait or balance.   Patient Active Problem List   Diagnosis Date Noted  . Thoracic aortic aneurysm ([place]) 12/10/2017  . Bicuspid aortic valve 12/10/2017  . Overweight (BMI 25.0-29.9) 09/18/2017  . Dyslipidemia 09/18/2017  . Paroxysmal supraventricular tachycardia ([place]) 09/18/2017  . Former pipe smoker 09/18/2017  . Aortic insufficiency 09/09/2017  . Palpitations 09/04/2014     Prior to Admission medications   Medication Sig Start Date End Date Taking? Authorizing Provider  apixaban (ELIQUIS) 5 MG TABS tablet Take 1 tablet (5 mg total) by mouth 2 (two) times daily. 01/28/18  Yes Sherran Needs, NP  Cholecalciferol (HM VITAMIN D3) 4000 units CAPS Take 4,000 Units by mouth daily.   Yes [provider]  Cyanocobalamin (VITAMIN B-12 CR) 1500 MCG TBCR Take 1 tablet by mouth daily.   Yes [provider]  GLUCOSAMINE HCL PO Take 2,000 mg by mouth daily.   Yes [provider]  metoprolol tartrate (LOPRESSOR) 25 MG tablet Take 1 tablet (25 mg total) by mouth every 6 (six) hours as needed (palpitations). Patient taking differently: Take 25 mg by mouth every 6 (six) hours as needed (palpitations.Marland KitchenMarland KitchenRARELY USED).  12/15/17 03/15/18 Yes Allred, Jeneen Rinks, MD  Omega-3 Fatty Acids (FISH OIL) 1000 MG CAPS Take by mouth.   Yes [provider]     No Known Allergies   Family History  Problem Relation Age of Onset  . Heart disease Mother   . Hypertension Father   . Stroke Father   . COPD Sister   . Colon polyps Sister   . Hypertension Brother   . COPD Brother   . Colon cancer Neg Hx   . Esophageal cancer Neg Hx   . Rectal cancer Neg Hx   . Stomach cancer Neg Hx      Social History   Socioeconomic History  . Marital status: Married    Spouse name: Not on file  . Number of children: Not on file  . Years of education: Not on file  . Highest education level: Not on file  Occupational History  . 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 Financial resource strain: Not on  file  . Food insecurity:    Worry: Not on file    Inability: Not on file  . Transportation needs:    Medical: Not on file    Non-medical: Not on file  Tobacco Use  . Smoking status: Former Smoker    Years: 18.00    Types: Pipe  . Smokeless tobacco: Never Used  . Tobacco comment: smoked a pipe  Substance and Sexual Activity  . Alcohol use: Not Currently  . Drug use: Never  . Sexual activity: Yes  Lifestyle  . Physical activity:    Days per week: Not on file    Minutes per session: Not on file  . Stress: Not on file  Relationships  . Social connections:    Talks on phone: Not on file    Gets together: Not on file    Attends  religious service: Not on file    Active member of club or organization: Not on file    Attends meetings of clubs or organizations: Not on file    Relationship status: Not on file  . Intimate partner violence:    Fear of current or ex partner: Not on file    Emotionally abused: Not on file    Physically abused: Not on file    Forced sexual activity: Not on file  Other Topics Concern  . Not on file  Social History Narrative   Lives in [place]   Retired English as a second language teacher     Review of Systems  Constitutional: Negative.  Negative for activity change, appetite change, chills, diaphoresis, fatigue and fever.  HENT: Negative.   Eyes: Negative.   Respiratory: Negative.   Cardiovascular: Negative.   Gastrointestinal: Negative.  Negative for abdominal distention, abdominal pain, constipation, diarrhea, nausea and vomiting.  Endocrine: Negative.   Genitourinary: Positive for hematuria. Negative for decreased urine volume, difficulty urinating, discharge, dysuria, flank pain, frequency, penile pain, penile swelling, scrotal swelling, testicular pain and urgency.  Musculoskeletal: Negative.  Negative for back pain and joint swelling.  Skin: Negative.  Negative for color change.  Allergic/Immunologic: Negative.   Neurological: Negative.  Negative for dizziness, tremors, weakness, light-headedness and headaches.  Hematological: Negative.   Psychiatric/Behavioral: Negative.   All other systems reviewed and are negative.      Objective:    Vitals:   02/10/18 1152  BP: 130/82  Pulse: (!) 49  Resp: 15  Temp: 98.6 F (37 C)  TempSrc: Oral  SpO2: 98%  Weight: 202 lb 8 oz (91.9 kg)  Height: 6' 1.5" (1.867 m)      Physical Exam Vitals signs and nursing note reviewed.  Constitutional:      General: He is not in acute distress.    Appearance: Normal appearance. He is well-developed. He is not ill-appearing, toxic-appearing or diaphoretic.  HENT:     Head: Normocephalic and atraumatic.      Nose: Nose normal.     Mouth/Throat:     Mouth: Mucous membranes are moist.  Eyes:     General:        Right eye: No discharge.        Left eye: No discharge.     Conjunctiva/sclera: Conjunctivae normal.  Neck:     Trachea: No tracheal deviation.  Cardiovascular:     Rate and Rhythm: Regular rhythm. Bradycardia present.     Pulses: Normal pulses.     Heart sounds: Murmur present. No friction rub. No gallop.   Pulmonary:     Effort: Pulmonary effort is normal.  No respiratory distress.     Breath sounds: Normal breath sounds. No stridor.  Abdominal:     General: Bowel sounds are normal. There is no distension.     Palpations: Abdomen is soft.     Tenderness: There is no abdominal tenderness. There is no right CVA tenderness, left CVA tenderness, guarding or rebound.     Hernia: No hernia is present.  Musculoskeletal: Normal range of motion.     Right lower leg: No edema.     Left lower leg: No edema.  Skin:    General: Skin is warm and dry.     Capillary Refill: Capillary refill takes less than 2 seconds.     Findings: No rash.  Neurological:     Mental Status: He is alert.     Motor: No abnormal muscle tone.     Coordination: Coordination normal.  Psychiatric:        Mood and Affect: Mood normal.        Behavior: Behavior normal.     Results for orders placed or performed in visit on 02/10/18  Urine Culture  Result Value Ref Range   MICRO NUMBER: [id number]    SPECIMEN QUALITY: Adequate    Sample Source URINE    STATUS: FINAL    Result: No Growth   Urinalysis, Routine w reflex microscopic  Result Value Ref Range   Color, Urine YELLOW YELLOW   APPearance CLOUDY (A) CLEAR   Specific Gravity, Urine 1.010 1.001 - 1.03   pH 6.5 5.0 - 8.0   Glucose, UA NEGATIVE NEGATIVE   Bilirubin Urine NEGATIVE NEGATIVE   Ketones, ur NEGATIVE NEGATIVE   Hgb urine dipstick 3+ (A) NEGATIVE   Protein, ur NEGATIVE NEGATIVE   Nitrite NEGATIVE NEGATIVE   Leukocytes, UA NEGATIVE NEGATIVE     WBC, UA NONE SEEN 0 - 5 /HPF   RBC / HPF 20-40 (A) 0 - 2 /HPF   Squamous Epithelial / LPF NONE SEEN < OR = 5 /HPF   Bacteria, UA NONE SEEN NONE SEEN /HPF  Microscopic Message  Result Value Ref Range   Note    PSA  Result Value Ref Range   PSA 1.1 < OR = 4.0 ng/mL  COMPLETE METABOLIC PANEL WITH GFR  Result Value Ref Range   Glucose, Bld 85 65 - 99 mg/dL   BUN 13 7 - 25 mg/dL   Creat 0.76 0.70 - 1.18 mg/dL   GFR, Est Non African American 91 > OR = 60 mL/min/1.1m2   GFR, Est African American 105 > OR = 60 mL/min/1.58m2   BUN/Creatinine Ratio NOT APPLICABLE 6 - 22 (calc)   Sodium 140 135 - 146 mmol/L   Potassium 4.9 3.5 - 5.3 mmol/L   Chloride 106 98 - 110 mmol/L   CO2 27 20 - 32 mmol/L   Calcium 9.4 8.6 - 10.3 mg/dL   Total Protein 6.9 6.1 - 8.1 g/dL   Albumin 3.9 3.6 - 5.1 g/dL   Globulin 3.0 1.9 - 3.7 g/dL (calc)   AG Ratio 1.3 1.0 - 2.5 (calc)   Total Bilirubin 0.6 0.2 - 1.2 mg/dL   Alkaline phosphatase (APISO) 75 40 - 115 U/L   AST 18 10 - 35 U/L   ALT 20 9 - 46 U/L  CBC with Differential  Result Value Ref Range   WBC 4.9 3.8 - 10.8 Thousand/uL   RBC 4.79 4.20 - 5.80 Million/uL   Hemoglobin 14.1 13.2 - 17.1 g/dL   HCT 43.2 38.5 -  50.0 %   MCV 90.2 80.0 - 100.0 fL   MCH 29.4 27.0 - 33.0 pg   MCHC 32.6 32.0 - 36.0 g/dL   RDW 13.0 11.0 - 15.0 %   Platelets 199 140 - 400 Thousand/uL   MPV 11.1 7.5 - 12.5 fL   Neutro Abs 2,264 1,500 - 7,800 cells/uL   Lymphs Abs 1,818 850 - 3,900 cells/uL   Absolute Monocytes 559 200 - 950 cells/uL   Eosinophils Absolute 211 15 - 500 cells/uL   Basophils Absolute 49 0 - 200 cells/uL   Neutrophils Relative % 46.2 %   Total Lymphocyte 37.1 %   Monocytes Relative 11.4 %   Eosinophils Relative 4.3 %   Basophils Relative 1.0 %        Assessment & Plan:      ICD-10-CM   1. Hematuria, unspecified type R31.9 Urinalysis, Routine w reflex microscopic    Microscopic Message    Urine Culture    cephALEXin (KEFLEX) 500 MG capsule     PSA    COMPLETE METABOLIC PANEL WITH GFR    CBC with Differential  2. Hyperkalemia E87.5    recheck bmp  3. On continuous oral anticoagulation Z79.01    with hematuria - no other bleeding sx, recheck CMP and CBC    Gross hematuria x 4 days, similar to a past UTI, recently started eliquis, UA and microscopy shows 3+ blood, otherwise negative, culture added to eval for UTI?  Pt prefers to start tx and later d/c if culture is negative - because of similar UTI in the past that was just like this.  Patient's last labs showed elevated potassium, will recheck CMP and CBC today.  He states he is never had PSA checked will check that.  He believes he has had a history of mild BPH, states he went to urology several years ago, currently no LUTS sx.  Will work up UTI, pt prefers to start abx tx.    Anticipate referral to urology if hematuria does not resolve  No other bleeding issues (no bruising, with brushing teeth, melena)   [name], PA-C 02/12/18 6:04 PM

## 2018-02-11 LAB — URINE CULTURE
MICRO NUMBER:: 115407
Result:: NO GROWTH
SPECIMEN QUALITY: ADEQUATE

## 2018-02-12 ENCOUNTER — Other Ambulatory Visit: Payer: Self-pay | Admitting: Family Medicine

## 2018-02-12 ENCOUNTER — Encounter: Payer: Self-pay | Admitting: Family Medicine

## 2018-02-12 DIAGNOSIS — R319 Hematuria, unspecified: Secondary | ICD-10-CM

## 2018-02-27 ENCOUNTER — Encounter (HOSPITAL_COMMUNITY): Payer: Self-pay | Admitting: Nurse Practitioner

## 2018-02-27 ENCOUNTER — Ambulatory Visit (HOSPITAL_COMMUNITY)
Admission: RE | Admit: 2018-02-27 | Discharge: 2018-02-27 | Disposition: A | Discharge status: Home / Self Care | Payer: PPO | Source: Ambulatory Visit | Attending: Nurse Practitioner | Admitting: Nurse Practitioner

## 2018-02-27 VITALS — BP 152/74 | HR 67 | Ht 73.5 in | Wt 204.0 lb

## 2018-02-27 DIAGNOSIS — I480 Paroxysmal atrial fibrillation: Secondary | ICD-10-CM

## 2018-02-27 DIAGNOSIS — I4510 Unspecified right bundle-branch block: Secondary | ICD-10-CM | POA: Insufficient documentation

## 2018-02-27 DIAGNOSIS — R030 Elevated blood-pressure reading, without diagnosis of hypertension: Secondary | ICD-10-CM | POA: Insufficient documentation

## 2018-02-27 DIAGNOSIS — I471 Supraventricular tachycardia: Secondary | ICD-10-CM

## 2018-02-27 DIAGNOSIS — Z7901 Long term (current) use of anticoagulants: Secondary | ICD-10-CM | POA: Insufficient documentation

## 2018-02-27 DIAGNOSIS — Z8249 Family history of ischemic heart disease and other diseases of the circulatory system: Secondary | ICD-10-CM | POA: Diagnosis not present

## 2018-02-27 DIAGNOSIS — R9431 Abnormal electrocardiogram [ECG] [EKG]: Secondary | ICD-10-CM | POA: Diagnosis not present

## 2018-02-27 DIAGNOSIS — I472 Ventricular tachycardia: Secondary | ICD-10-CM | POA: Insufficient documentation

## 2018-02-27 DIAGNOSIS — Z79899 Other long term (current) drug therapy: Secondary | ICD-10-CM | POA: Diagnosis not present

## 2018-02-27 DIAGNOSIS — I4892 Unspecified atrial flutter: Secondary | ICD-10-CM | POA: Diagnosis not present

## 2018-02-27 DIAGNOSIS — Z87891 Personal history of nicotine dependence: Secondary | ICD-10-CM | POA: Diagnosis not present

## 2018-02-27 DIAGNOSIS — I719 Aortic aneurysm of unspecified site, without rupture: Secondary | ICD-10-CM | POA: Diagnosis not present

## 2018-02-27 DIAGNOSIS — Q231 Congenital insufficiency of aortic valve: Secondary | ICD-10-CM | POA: Diagnosis not present

## 2018-02-27 LAB — CBC WITH DIFFERENTIAL/PLATELET
Abs Immature Granulocytes: 0.01 K/uL (ref 0.00–0.07)
Basophils Absolute: 0 K/uL (ref 0.0–0.1)
Basophils Relative: 1 %
Eosinophils Absolute: 0.1 K/uL (ref 0.0–0.5)
Eosinophils Relative: 3 %
HCT: 42.4 % (ref 39.0–52.0)
Hemoglobin: 13.6 g/dL (ref 13.0–17.0)
Immature Granulocytes: 0 %
Lymphocytes Relative: 31 %
Lymphs Abs: 1.3 K/uL (ref 0.7–4.0)
MCH: 29.6 pg (ref 26.0–34.0)
MCHC: 32.1 g/dL (ref 30.0–36.0)
MCV: 92.2 fL (ref 80.0–100.0)
Monocytes Absolute: 0.5 K/uL (ref 0.1–1.0)
Monocytes Relative: 13 %
Neutro Abs: 2.1 K/uL (ref 1.7–7.7)
Neutrophils Relative %: 52 %
Platelets: 185 K/uL (ref 150–400)
RBC: 4.6 MIL/uL (ref 4.22–5.81)
RDW: 13.9 % (ref 11.5–15.5)
WBC: 4.1 K/uL (ref 4.0–10.5)
nRBC: 0 % (ref 0.0–0.2)

## 2018-02-27 NOTE — Progress Notes (Signed)
Primary Care Physician: [name], PA-C Referring Physician: Dr. [name]  Cardiologist: Dr. [name] is a 73 y.o. male with a h/o atrial arrhythmia's and NSVT, AI/AS, bicuspid aortic valve and aortic aneurysm. He is being followed by Dr. [name]. At the time that [name] saw the pt, he felt the arrhythmia's were better controlled with alcohol and caffeine avoidance. He also was noted to have sinus brady and more so nocturnal bradycardia, limiting AAD options. He had a CHA2DS2VASc score of 2, anticoagulation was recommended but pt was not ready to start.  In the Afib clinic, still reports that he notices very little irregular heart beat, sometimes very early in the am when he first wakes up.  Discussed anticoagulation guidelines again and he is now ready to start.  F/u in afib clinic, 2/14. He reports that he had hematuria x 2 days right after staring anticoagulation. He went to PCP, urine tests and PSA were negative, The bleeding spontaneously cleared and has not returned. He has had this intermittently over the years. He reports no episodes of afib.  Today, he denies symptoms of palpitations, chest pain, shortness of breath, orthopnea, PND, lower extremity edema, dizziness, presyncope, syncope, or neurologic sequela. The patient is tolerating medications without difficulties and is otherwise without complaint today.   Past Medical History:  Diagnosis Date  . Aortic aneurysm ([place])   . Aortic insufficiency due to bicuspid aortic valve   . Aortic stenosis due to bicuspid aortic valve   . Atrial flutter ([place])   . Bicuspid aortic valve   . GERD (gastroesophageal reflux disease)    past hx- stopped alcohol and no more issues   . NSVT (nonsustained ventricular tachycardia) ([place])   . Paroxysmal atrial fibrillation ([facility])   . PVC's (premature ventricular contractions)   . RBBB    Past Surgical History:  Procedure Laterality Date  . COLONOSCOPY     12 yrs ago [facility]- normal per  pt   . CYST EXCISION     left knee   . CYST EXCISION     tongue    Current Outpatient Medications  Medication Sig Dispense Refill  . apixaban (ELIQUIS) 5 MG TABS tablet Take 1 tablet (5 mg total) by mouth 2 (two) times daily. 60 tablet 6  . Cholecalciferol (HM VITAMIN D3) 4000 units CAPS Take 4,000 Units by mouth daily.    . Cyanocobalamin (VITAMIN B-12 CR) 1500 MCG TBCR Take 1 tablet by mouth daily.    Marland Kitchen GLUCOSAMINE HCL PO Take 2,000 mg by mouth daily.    . Omega-3 Fatty Acids (FISH OIL) 1000 MG CAPS Take by mouth.    . metoprolol tartrate (LOPRESSOR) 25 MG tablet Take 1 tablet (25 mg total) by mouth every 6 (six) hours as needed (palpitations). (Patient not taking: Reported on 02/27/2018) 30 tablet 1   No current facility-administered medications for this encounter.     No Known Allergies  Social History   Socioeconomic History  . Marital status: Married    Spouse name: Not on file  . Number of children: Not on file  . Years of education: Not on file  . Highest education level: Not on file  Occupational History  . 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 Financial resource strain: Not on file  . Food insecurity:    Worry: Not on file    Inability: Not on file  . Transportation needs:  Medical: Not on file    Non-medical: Not on file  Tobacco Use  . Smoking status: Former Smoker    Years: 18.00    Types: Pipe  . Smokeless tobacco: Never Used  . Tobacco comment: smoked a pipe  Substance and Sexual Activity  . Alcohol use: Not Currently  . Drug use: Never  . Sexual activity: Yes  Lifestyle  . Physical activity:    Days per week: Not on file    Minutes per session: Not on file  . Stress: Not on file  Relationships  . Social connections:    Talks on phone: Not on file    Gets together: Not on file    Attends religious service: Not on file    Active member of club or organization: Not on file    Attends meetings of  clubs or organizations: Not on file    Relationship status: Not on file  . Intimate partner violence:    Fear of current or ex partner: Not on file    Emotionally abused: Not on file    Physically abused: Not on file    Forced sexual activity: Not on file  Other Topics Concern  . Not on file  Social History Narrative   Lives in [place]   Retired English as a second language teacher    Family History  Problem Relation Age of Onset  . Heart disease Mother   . Hypertension Father   . Stroke Father   . COPD Sister   . Colon polyps Sister   . Hypertension Brother   . COPD Brother   . Colon cancer Neg Hx   . Esophageal cancer Neg Hx   . Rectal cancer Neg Hx   . Stomach cancer Neg Hx     ROS- All systems are reviewed and negative except as per the HPI above  Physical Exam: Vitals:   02/27/18 1059  BP: (!) 152/74  Pulse: 67  Weight: 92.5 kg  Height: 6' 1.5" (1.867 m)   Wt Readings from Last 3 Encounters:  02/27/18 92.5 kg  02/10/18 91.9 kg  01/28/18 91.4 kg    Labs: Lab Results  Component Value Date   NA 140 02/10/2018   K 4.9 02/10/2018   CL 106 02/10/2018   CO2 27 02/10/2018   GLUCOSE 85 02/10/2018   BUN 13 02/10/2018   CREATININE 0.76 02/10/2018   CALCIUM 9.4 02/10/2018   No results found for: INR Lab Results  Component Value Date   CHOL 160 09/03/2017   HDL 44 09/03/2017   LDLCALC 103 (H) 09/03/2017   TRIG 44 09/03/2017     GEN- The patient is well appearing, alert and oriented x 3 today.   Head- normocephalic, atraumatic Eyes-  Sclera clear, conjunctiva pink Ears- hearing intact Oropharynx- clear Neck- supple, no JVP Lymph- no cervical lymphadenopathy Lungs- Clear to ausculation bilaterally, normal work of breathing Heart- Regular rate and rhythm, no murmurs, rubs or gallops, PMI not laterally displaced GI- soft, NT, ND, + BS Extremities- no clubbing, cyanosis, or edema MS- no significant deformity or atrophy Skin- no rash or lesion Psych- euthymic mood, full  affect Neuro- strength and sensation are intact  EKG- NSR at 67 bpm, LAD, IRBBB, LVH    Assessment and Plan: 1. Paroxysmal  afib/ atrial flutter   He feels his afib is well controlled  Sinus bradycardia  limits treatment options.   Still wants to avoid Tikosyn Continue current dose of BB  2.Chads2vasc score is 2 (age, vascular disease) Hematuria  initially but no bleeding for the last 3 weeks If returns he plans to f/u with urology Bleeding  precautions discussed Continue  eliquis 5 mg bid  Cbc today  3. NSVT asymtomatic Normal EF No workup planned  4. Borderline HTN Bp mildly elevated today.  He is clear that he does not currently carry a diagnosis of HTN.   He will watch at home and discuss with Dr. Gwenlyn Found 2/23 if remains elevated  5. Bicuspid Aorta with AI/AS, aortic aneurysm per Dr. [name]  F/u here as needed  Geroge Baseman. Jonia Oakey, [facility] [address] [place], [place] [address] [phone]

## 2018-03-13 ENCOUNTER — Encounter: Payer: Self-pay | Admitting: Cardiovascular Disease

## 2018-03-13 ENCOUNTER — Ambulatory Visit: Payer: PPO | Admitting: Cardiovascular Disease

## 2018-03-13 VITALS — BP 150/88 | HR 68 | Ht 73.0 in | Wt 202.4 lb

## 2018-03-13 DIAGNOSIS — I712 Thoracic aortic aneurysm, without rupture, unspecified: Secondary | ICD-10-CM

## 2018-03-13 DIAGNOSIS — Z79899 Other long term (current) drug therapy: Secondary | ICD-10-CM

## 2018-03-13 DIAGNOSIS — R002 Palpitations: Secondary | ICD-10-CM | POA: Diagnosis not present

## 2018-03-13 DIAGNOSIS — I10 Essential (primary) hypertension: Secondary | ICD-10-CM | POA: Insufficient documentation

## 2018-03-13 DIAGNOSIS — I351 Nonrheumatic aortic (valve) insufficiency: Secondary | ICD-10-CM | POA: Diagnosis not present

## 2018-03-13 DIAGNOSIS — E785 Hyperlipidemia, unspecified: Secondary | ICD-10-CM

## 2018-03-13 MED ORDER — LISINOPRIL 5 MG PO TABS: 5.0000 mg | ORAL_TABLET | Freq: Every day | ORAL | 3 refills | Status: DC

## 2018-03-13 NOTE — Progress Notes (Signed)
03/13/2018 Jerry Velazquez   Sep 28, 1945  [id number]  Primary Physician [name], PA-C Primary Cardiologist: Lorretta Harp MD Lupe Carney, [place]  HPI:  Jerry Velazquez is a 73 y.o.  thin appearing married Caucasian male father of no children who is recently retired Nurse, mental health.  He was referred by [name], PA-C for cardiovascular evaluation because of aortic insufficiency.    I last saw him in the office 10/21/2017.  He has no cardiac risk factors.  He was seen because of palpitations with a recent Holter monitor that showed PVCs and short atrial runs.  2D echo performed because of his soft murmur revealed a bicuspid aortic valve with mild stenosis and mild to moderate aortic insufficiency with normal LV size and function.  There was noted mild dilatation of the thoracic aorta.  He also experiences a quivering sensation in his chest.  Denies chest pain or shortness of breath.  I referred him to Dr. [name] and Dr. [name] as well.  Ultimately he was begun on Eliquis oral anticoagulation.  A chest CTA was performed 10/31/2017 revealing a 4.9 cm ascending thoracic aortic aneurysm.  This will be followed on an semiannual basis.  He will ultimately need thoracic aortic repair as well as AVR.  It is possible that he would also benefit from a surgical maze procedure during the operation and removal of his left atrial appendage but I will leave this to Dr. [name] to decide.  Current Meds  Medication Sig  . apixaban (ELIQUIS) 5 MG TABS tablet Take 1 tablet (5 mg total) by mouth 2 (two) times daily.  . Cholecalciferol (HM VITAMIN D3) 4000 units CAPS Take 4,000 Units by mouth daily.  . Cyanocobalamin (VITAMIN B-12 CR) 1500 MCG TBCR Take 1 tablet by mouth daily.  Marland Kitchen GLUCOSAMINE HCL PO Take 2,000 mg by mouth daily.  . metoprolol tartrate (LOPRESSOR) 25 MG tablet Take 1 tablet (25 mg total) by mouth every 6 (six) hours as needed (palpitations).  . Omega-3 Fatty Acids (FISH OIL) 1000  MG CAPS Take by mouth.     No Known Allergies  Social History   Socioeconomic History  . Marital status: Married    Spouse name: Not on file  . Number of children: Not on file  . Years of education: Not on file  . Highest education level: Not on file  Occupational History  . 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 Financial resource strain: Not on file  . Food insecurity:    Worry: Not on file    Inability: Not on file  . Transportation needs:    Medical: Not on file    Non-medical: Not on file  Tobacco Use  . Smoking status: Former Smoker    Years: 18.00    Types: Pipe  . Smokeless tobacco: Never Used  . Tobacco comment: smoked a pipe  Substance and Sexual Activity  . Alcohol use: Not Currently  . Drug use: Never  . Sexual activity: Yes  Lifestyle  . Physical activity:    Days per week: Not on file    Minutes per session: Not on file  . Stress: Not on file  Relationships  . Social connections:    Talks on phone: Not on file    Gets together: Not on file    Attends religious service: Not on file    Active member of club or organization: Not  on file    Attends meetings of clubs or organizations: Not on file    Relationship status: Not on file  . Intimate partner violence:    Fear of current or ex partner: Not on file    Emotionally abused: Not on file    Physically abused: Not on file    Forced sexual activity: Not on file  Other Topics Concern  . Not on file  Social History Narrative   Lives in [place]   Retired English as a second language teacher     Review of Systems: General: negative for chills, fever, night sweats or weight changes.  Cardiovascular: negative for chest pain, dyspnea on exertion, edema, orthopnea, palpitations, paroxysmal nocturnal dyspnea or shortness of breath Dermatological: negative for rash Respiratory: negative for cough or wheezing Urologic: negative for hematuria Abdominal: negative for nausea, vomiting,  diarrhea, bright red blood per rectum, melena, or hematemesis Neurologic: negative for visual changes, syncope, or dizziness All other systems reviewed and are otherwise negative except as noted above.    Blood pressure (!) 150/88, pulse 68, height 6\' 1"  (1.854 m), weight 202 lb 6.4 oz (91.8 kg), SpO2 97 %.  General appearance: alert and no distress Neck: no adenopathy, no carotid bruit, no JVD, supple, symmetrical, trachea midline and thyroid not enlarged, symmetric, no tenderness/mass/nodules Lungs: clear to auscultation bilaterally Heart: regular rate and rhythm, S1, S2 normal, no murmur, click, rub or gallop Extremities: extremities normal, atraumatic, no cyanosis or edema Pulses: 2+ and symmetric Skin: Skin color, texture, turgor normal. No rashes or lesions Neurologic: Alert and oriented X 3, normal strength and tone. Normal symmetric reflexes. Normal coordination and gait  EKG not performed today  ASSESSMENT AND PLAN:   Palpitations History of palpitations with event monitor that has shown PVCs and runs of atrial flutter.  He was subsequently begun on Eliquis.  He is also cut out caffeine.  He takes a low-dose beta-blocker PRN.  His baseline heart rate is in the 50-60 range.  Dyslipidemia History of dyslipidemia with recent lipid profile performed 09/03/2017 revealing total cholesterol 160, LDL 103 and HDL 44.  He is not on a statin drug.  Aortic insufficiency History of mild to moderate aortic insufficiency and mild aortic stenosis that bicuspid aortic valve on echo performed 09/09/2017 which which will be repeated on an annual basis.  Thoracic aortic aneurysm [facility]) Thoracic aortic aneurysm measuring 4.9 cm by chest CTA performed 10/31/2017.  He has seen Dr. [name] for this who is following him as well.  We will repeat his CTA in April.      Lorretta Harp MD FACP,FACC,FAHA, [facility] 03/13/2018 11:02 AM

## 2018-03-13 NOTE — Assessment & Plan Note (Signed)
History of mild to moderate aortic insufficiency and mild aortic stenosis that bicuspid aortic valve on echo performed 09/09/2017 which which will be repeated on an annual basis.

## 2018-03-13 NOTE — Patient Instructions (Addendum)
Medication Instructions:  Dr Gwenlyn Found has recommended making the following medication changes: 1. START Lisinopril 5 mg - take 1 tablet daily  Your physician has requested that you regularly monitor your blood pressure at home. Please use the same machine to check your blood pressure daily. Keep a record of your blood pressures. Please bring your readings with you to your appointment with our clinical pharmacist.  If you need a refill on your cardiac medications before your next appointment, please call your pharmacy.   Lab work: Your physician recommends that you return for lab work in 7-10 days.  If you have labs (blood work) drawn today and your tests are completely normal, you will receive your results only by: Marland Kitchen MyChart Message (if you have MyChart) OR . A paper copy in the mail If you have any lab test that is abnormal or we need to change your treatment, we will call you to review the results.  Testing/Procedures: 1. Chest CT Aorta - Your physician has recommended that you have a follow-up Chest CT to evaluate your aorta. This will be performed at our [facility] location.   2. Your physician has requested that you have an echocardiogram. Echocardiography is a painless test that uses sound waves to create images of your heart. It provides your doctor with information about the size and shape of your heart and how well your heart's chambers and valves are working. This procedure takes approximately one hour. There are no restrictions for this procedure.  >> This will be performed at our [facility] location [place], [place] [address] [phone]  Follow-Up: Your physician recommends that you schedule a follow-up appointment in 1 month with one of our clinical pharmacists for a blood pressure check.  At [facility], you and your health needs are our priority.  As part of our continuing mission to provide you with exceptional heart care, we have created designated  Provider Care Teams.  These Care Teams include your primary Cardiologist (physician) and Advanced Practice Providers (APPs -  Physician Assistants and Nurse Practitioners) who all work together to provide you with the care you need, when you need it. You will need a follow up appointment in 6 months.  Please call our office 2 months in advance to schedule this appointment.  You may see Quay Burow, MD or one of the following Advanced Practice Providers on your designated Care Team:   Kerin Ransom, PA-C Roby Lofts, [place] . Sande Rives, PA-C . You will receive a reminder letter in the mail two months in advance. If you don't receive a letter, please call our office to schedule the follow-up appointment.

## 2018-03-13 NOTE — Assessment & Plan Note (Signed)
History of palpitations with event monitor that has shown PVCs and runs of atrial flutter.  He was subsequently begun on Eliquis.  He is also cut out caffeine.  He takes a low-dose beta-blocker PRN.  His baseline heart rate is in the 50-60 range.

## 2018-03-13 NOTE — Assessment & Plan Note (Signed)
Blood pressure has been moderately elevated and in the setting of a thoracic aortic aneurysm I decided to begin him on low-dose ACE inhibition.  We will check a basic metabolic panel in 7 to 10 days.  He will keep a daily blood pressure log and will see Kristen back in 4 weeks for further evaluation.

## 2018-03-13 NOTE — Assessment & Plan Note (Signed)
History of dyslipidemia with recent lipid profile performed 09/03/2017 revealing total cholesterol 160, LDL 103 and HDL 44.  He is not on a statin drug.

## 2018-03-13 NOTE — Assessment & Plan Note (Signed)
Thoracic aortic aneurysm measuring 4.9 cm by chest CTA performed 10/31/2017.  He has seen Dr. [name] for this who is following him as well.  We will repeat his CTA in April.

## 2018-03-20 ENCOUNTER — Other Ambulatory Visit (HOSPITAL_COMMUNITY): Payer: PPO

## 2018-03-23 DIAGNOSIS — Z79899 Other long term (current) drug therapy: Secondary | ICD-10-CM | POA: Diagnosis not present

## 2018-03-23 LAB — BASIC METABOLIC PANEL
BUN/Creatinine Ratio: 16 (ref 10–24)
BUN: 13 mg/dL (ref 8–27)
CALCIUM: 9.4 mg/dL (ref 8.6–10.2)
CHLORIDE: 104 mmol/L (ref 96–106)
CO2: 24 mmol/L (ref 20–29)
Creatinine, Ser: 0.83 mg/dL (ref 0.76–1.27)
GFR calc Af Amer: 101 mL/min/{1.73_m2} (ref >59)
GFR calc non Af Amer: 87 mL/min/{1.73_m2} (ref >59)
Glucose: 91 mg/dL (ref 65–99)
POTASSIUM: 5.4 mmol/L — AB (ref 3.5–5.2)
SODIUM: 141 mmol/L (ref 134–144)

## 2018-04-13 ENCOUNTER — Telehealth: Payer: Self-pay | Admitting: Cardiovascular Disease

## 2018-04-13 NOTE — Telephone Encounter (Signed)
Will forward to [place] with Dr Gwenlyn Found

## 2018-04-13 NOTE — Telephone Encounter (Signed)
Patient has appt on 4/3 he is wondering if he should come in to that appt or if he can set up a virtual visit.

## 2018-04-15 ENCOUNTER — Telehealth: Payer: Self-pay

## 2018-04-15 ENCOUNTER — Other Ambulatory Visit: Payer: Self-pay | Admitting: Surgery

## 2018-04-15 DIAGNOSIS — I712 Thoracic aortic aneurysm, without rupture, unspecified: Secondary | ICD-10-CM

## 2018-04-15 NOTE — Telephone Encounter (Signed)

## 2018-04-15 NOTE — Telephone Encounter (Signed)
Virtual Visit Pre-Appointment Phone Call  Steps For Call:  1. Confirm consent - "In the setting of the current Covid19 crisis, you are scheduled for a (phone or video) visit with your provider on (date) at (time).  Just as we do with many in-office visits, in order for you to participate in this visit, we must obtain consent.  If you'd like, I can send this to your mychart (if signed up) or email for you to review.  Otherwise, I can obtain your verbal consent now.  All virtual visits are billed to your insurance company just like a normal visit would be.  By agreeing to a virtual visit, we'd like you to understand that the technology does not allow for your provider to perform an examination, and thus may limit your provider's ability to fully assess your condition.  Finally, though the technology is pretty good, we cannot assure that it will always work on either your or our end, and in the setting of a video visit, we may have to convert it to a phone-only visit.  In either situation, we cannot ensure that we have a secure connection.  Are you willing to proceed?"  2. Give patient instructions for WebEx download to smartphone as below if video visit  3. Advise patient to be prepared with any vital sign or heart rhythm information, their current medicines, and a piece of paper and pen handy for any instructions they may receive the day of their visit  4. Inform patient they will receive a phone call 15 minutes prior to their appointment time (may be from unknown caller ID) so they should be prepared to answer  5. Confirm that appointment type is correct in Epic appointment notes (video vs telephone)    TELEPHONE CALL NOTE  Jerry Velazquez has been deemed a candidate for a follow-up tele-health visit to limit community exposure during the Covid-19 pandemic. I spoke with the patient via phone to ensure availability of phone/video source, confirm preferred email & phone number, and discuss  instructions and expectations.  I reminded Jerry Velazquez to be prepared with any vital sign and/or heart rhythm information that could potentially be obtained via home monitoring, at the time of his visit. I reminded Jerry Velazquez to expect a phone call at the time of his visit if his visit.  Did the patient verbally acknowledge consent to treatment? Yes [name], [place] 04/15/2018 11:53 AM   DOWNLOADING THE WEBEX SOFTWARE TO SMARTPHONE  - If Apple, go to [employer] and type in WebEx in the search bar. [place] [employer], the blue/green circle. The app is free but as with any other app downloads, their phone may require them to verify saved payment information or Apple password. The patient does NOT have to create an account.  - If Android, ask patient to go to [employer] and type in WebEx in the search bar. [place] [employer], the blue/green circle. The app is free but as with any other app downloads, their phone may require them to verify saved payment information or Android password. The patient does NOT have to create an account.   CONSENT FOR TELE-HEALTH VISIT - PLEASE REVIEW  I hereby voluntarily request, consent and authorize [facility] and its employed or contracted physicians, physician assistants, nurse practitioners or other licensed health care professionals (the Practitioner), to provide me with telemedicine health care services (the "Services") as deemed necessary by the treating Practitioner. I acknowledge  and consent to receive the Services by the Practitioner via telemedicine. I understand that the telemedicine visit will involve communicating with the Practitioner through live audiovisual communication technology and the disclosure of certain medical information by electronic transmission. I acknowledge that I have been given the opportunity to request an in-person assessment or other available alternative prior to the telemedicine visit and am  voluntarily participating in the telemedicine visit.  I understand that I have the right to withhold or withdraw my consent to the use of telemedicine in the course of my care at any time, without affecting my right to future care or treatment, and that the Practitioner or I may terminate the telemedicine visit at any time. I understand that I have the right to inspect all information obtained and/or recorded in the course of the telemedicine visit and may receive copies of available information for a reasonable fee.  I understand that some of the potential risks of receiving the Services via telemedicine include:  Marland Kitchen Delay or interruption in medical evaluation due to technological equipment failure or disruption; . Information transmitted may not be sufficient (e.g. poor resolution of images) to allow for appropriate medical decision making by the Practitioner; and/or  . In rare instances, security protocols could fail, causing a breach of personal health information.  Furthermore, I acknowledge that it is my responsibility to provide information about my medical history, conditions and care that is complete and accurate to the best of my ability. I acknowledge that Practitioner's advice, recommendations, and/or decision may be based on factors not within their control, such as incomplete or inaccurate data provided by me or distortions of diagnostic images or specimens that may result from electronic transmissions. I understand that the practice of medicine is not an exact science and that Practitioner makes no warranties or guarantees regarding treatment outcomes. I acknowledge that I will receive a copy of this consent concurrently upon execution via email to the email address I last provided but may also request a printed copy by calling the office of [place].    I understand that my insurance will be billed for this visit.   I have read or had this consent read to me. . I understand the  contents of this consent, which adequately explains the benefits and risks of the Services being provided via telemedicine.  . I have been provided ample opportunity to ask questions regarding this consent and the Services and have had my questions answered to my satisfaction. . I give my informed consent for the services to be provided through the use of telemedicine in my medical care  By participating in this telemedicine visit I agree to the above.

## 2018-04-15 NOTE — Telephone Encounter (Signed)
Left voice message on all numbers listed for the patient to call back pertaining to his scheduled appointment with Dr. Gwenlyn Found for 04-17-2018 at 8:45 AM and switching to a tele-health visit.

## 2018-04-15 NOTE — Telephone Encounter (Signed)
Spoke with pt. Pt agreeable with new appt time of 0800 on 4/3

## 2018-04-15 NOTE — Telephone Encounter (Signed)
Follow up   Patient is returning your call in reference to office on 04/17/2018. Please return call.

## 2018-04-16 ENCOUNTER — Other Ambulatory Visit: Payer: Self-pay

## 2018-04-16 NOTE — Telephone Encounter (Signed)
Patient is scheduled for a telephone visit with Dr. Gwenlyn Found for 04/17/2018

## 2018-04-16 NOTE — Telephone Encounter (Signed)
Pt has virtual visit on 4/3

## 2018-04-17 ENCOUNTER — Telehealth: Payer: Self-pay

## 2018-04-17 ENCOUNTER — Encounter: Payer: Self-pay | Admitting: Cardiovascular Disease

## 2018-04-17 ENCOUNTER — Telehealth (INDEPENDENT_AMBULATORY_CARE_PROVIDER_SITE_OTHER): Payer: PPO | Admitting: Cardiovascular Disease

## 2018-04-17 VITALS — BP 147/88 | HR 63 | Wt 199.0 lb

## 2018-04-17 DIAGNOSIS — R002 Palpitations: Secondary | ICD-10-CM | POA: Diagnosis not present

## 2018-04-17 DIAGNOSIS — I351 Nonrheumatic aortic (valve) insufficiency: Secondary | ICD-10-CM

## 2018-04-17 DIAGNOSIS — Z7189 Other specified counseling: Secondary | ICD-10-CM

## 2018-04-17 DIAGNOSIS — I712 Thoracic aortic aneurysm, without rupture: Secondary | ICD-10-CM | POA: Diagnosis not present

## 2018-04-17 DIAGNOSIS — Q231 Congenital insufficiency of aortic valve: Secondary | ICD-10-CM

## 2018-04-17 DIAGNOSIS — I10 Essential (primary) hypertension: Secondary | ICD-10-CM | POA: Diagnosis not present

## 2018-04-17 NOTE — Patient Instructions (Addendum)
Medication Instructions:  Your physician recommends that you continue on your current medications as directed. Please refer to the Current Medication list given to you today.  If you need a refill on your cardiac medications before your next appointment, please call your pharmacy.   Lab work: NONE If you have labs (blood work) drawn today and your tests are completely normal, you will receive your results only by: Marland Kitchen MyChart Message (if you have MyChart) OR . A paper copy in the mail If you have any lab test that is abnormal or we need to change your treatment, we will call you to review the results.  Testing/Procedures: DR. Gwenlyn Found RECOMMENDS THAT YOUR CHEST CT SCHEDULED FOR 05/04/2018 BE POSTPONED UNTIL MID-June/THE END OF June.   Your physician has requested that you have an echocardiogram. Echocardiography is a painless test that uses sound waves to create images of your heart. It provides your doctor with information about the size and shape of your heart and how well your heart's chambers and valves are working. This procedure takes approximately one hour. There are no restrictions for this procedure. LOCATION: [place], [place] [address] IT IS ALREADY SCHEDULED FOR 10/22/2018 AT 10:30 AM  Follow-Up: At [facility], you and your health needs are our priority.  As part of our continuing mission to provide you with exceptional heart care, we have created designated Provider Care Teams.  These Care Teams include your primary Cardiologist (physician) and Advanced Practice Providers (APPs -  Physician Assistants and Nurse Practitioners) who all work together to provide you with the care you need, when you need it. You will need a follow up appointment in 6 months WITH DR. Gwenlyn Found.  Please call our office 2 months in advance to schedule this appointment.

## 2018-04-17 NOTE — Telephone Encounter (Signed)
Virtual Visit Pre-Appointment Phone Call  Steps For Call:  1. Confirm consent - "In the setting of the current Covid19 crisis, you are scheduled for a (phone or video) visit with your provider on (date) at (time).  Just as we do with many in-office visits, in order for you to participate in this visit, we must obtain consent.  If you'd like, I can send this to your mychart (if signed up) or email for you to review.  Otherwise, I can obtain your verbal consent now.  All virtual visits are billed to your insurance company just like a normal visit would be.  By agreeing to a virtual visit, we'd like you to understand that the technology does not allow for your provider to perform an examination, and thus may limit your provider's ability to fully assess your condition.  Finally, though the technology is pretty good, we cannot assure that it will always work on either your or our end, and in the setting of a video visit, we may have to convert it to a phone-only visit.  In either situation, we cannot ensure that we have a secure connection.  Are you willing to proceed?"  2. Give patient instructions for WebEx download to smartphone as below if video visit  3. Advise patient to be prepared with any vital sign or heart rhythm information, their current medicines, and a piece of paper and pen handy for any instructions they may receive the day of their visit  4. Inform patient they will receive a phone call 15 minutes prior to their appointment time (may be from unknown caller ID) so they should be prepared to answer  5. Confirm that appointment type is correct in Epic appointment notes (video vs telephone)    TELEPHONE CALL NOTE  [place] has been deemed a candidate for a follow-up tele-health visit to limit community exposure during the Covid-19 pandemic. I spoke with the patient via phone to ensure availability of phone/video source, confirm preferred email & phone number, and discuss  instructions and expectations.  I reminded Denman P Saavedra to be prepared with any vital sign and/or heart rhythm information that could potentially be obtained via home monitoring, at the time of his visit. I reminded Nakoa Ganus Keeny to expect a phone call at the time of his visit if his visit.  Did the patient verbally acknowledge consent to treatment? YES  Jisel Fleet Dawna Part, RN 04/17/2018    DOWNLOADING THE [place], go to App Store and type in WebEx in the search bar. [place] [employer], the blue/green circle. The app is free but as with any other app downloads, their phone may require them to verify saved payment information or Apple password. The patient does NOT have to create an account.  - If Android, ask patient to go to [employer] and type in WebEx in the search bar. [place] [employer], the blue/green circle. The app is free but as with any other app downloads, their phone may require them to verify saved payment information or Android password. The patient does NOT have to create an account.   CONSENT FOR TELE-HEALTH VISIT - PLEASE REVIEW  I hereby voluntarily request, consent and authorize [facility] and its employed or contracted physicians, physician assistants, nurse practitioners or other licensed health care professionals (the Practitioner), to provide me with telemedicine health care services (the "Services") as deemed necessary by the treating Practitioner. I  acknowledge and consent to receive the Services by the Practitioner via telemedicine. I understand that the telemedicine visit will involve communicating with the Practitioner through live audiovisual communication technology and the disclosure of certain medical information by electronic transmission. I acknowledge that I have been given the opportunity to request an in-person assessment or other available alternative prior to the telemedicine visit and am  voluntarily participating in the telemedicine visit.  I understand that I have the right to withhold or withdraw my consent to the use of telemedicine in the course of my care at any time, without affecting my right to future care or treatment, and that the Practitioner or I may terminate the telemedicine visit at any time. I understand that I have the right to inspect all information obtained and/or recorded in the course of the telemedicine visit and may receive copies of available information for a reasonable fee.  I understand that some of the potential risks of receiving the Services via telemedicine include:  Marland Kitchen Delay or interruption in medical evaluation due to technological equipment failure or disruption; . Information transmitted may not be sufficient (e.g. poor resolution of images) to allow for appropriate medical decision making by the Practitioner; and/or  . In rare instances, security protocols could fail, causing a breach of personal health information.  Furthermore, I acknowledge that it is my responsibility to provide information about my medical history, conditions and care that is complete and accurate to the best of my ability. I acknowledge that Practitioner's advice, recommendations, and/or decision may be based on factors not within their control, such as incomplete or inaccurate data provided by me or distortions of diagnostic images or specimens that may result from electronic transmissions. I understand that the practice of medicine is not an exact science and that Practitioner makes no warranties or guarantees regarding treatment outcomes. I acknowledge that I will receive a copy of this consent concurrently upon execution via email to the email address I last provided but may also request a printed copy by calling the office of [place].    I understand that my insurance will be billed for this visit.   I have read or had this consent read to me. . I understand the  contents of this consent, which adequately explains the benefits and risks of the Services being provided via telemedicine.  . I have been provided ample opportunity to ask questions regarding this consent and the Services and have had my questions answered to my satisfaction. . I give my informed consent for the services to be provided through the use of telemedicine in my medical care  By participating in this telemedicine visit I agree to the above.

## 2018-04-17 NOTE — Progress Notes (Signed)
Thanks Dr. Gwenlyn Found

## 2018-04-17 NOTE — Progress Notes (Signed)
Virtual Visit via Telephone Note    Evaluation Performed:  Follow-up visit  This visit type was conducted due to national recommendations for restrictions regarding the COVID-19 Pandemic (e.g. social distancing).  This format is felt to be most appropriate for this patient at this time.  All issues noted in this document were discussed and addressed.  No physical exam was performed (except for noted visual exam findings with Video Visits).  Please refer to the patient's chart (MyChart message for video visits and phone note for telephone visits) for the patient's consent to telehealth for [facility].  Date:  04/17/2018   ID:  Jerry Velazquez, DOB 10-03-1945, MRN [id number]  Patient Location:  Patient's home  Provider location:   My home  PCP:  [name], PA-C  Cardiologist:  Quay Burow, MD  Electrophysiologist:  None   Chief Complaint: Discuss his blood pressure readings, his bicuspid aortic valve and thoracic aortic aneurysm, exercise limitation.  History of Present Illness:    Jerry Velazquez is a 73 y.o. male who presents via Engineer, civil (consulting) for a telehealth visit today.    The patient does not have symptoms concerning for COVID-19 infection (fever, chills, cough, or new shortness of breath).   Jerry Velazquez is a 73 y.o.  thin appearing married Caucasian male father of no children who is recently retired Nurse, mental health. He was referred by Gerhard Perches for cardiovascular evaluation because of aortic insufficiency.   I last saw him in the office 03/13/2018.  He has no cardiac risk factors. He was seen because of palpitations with a recent Holter monitor that showed PVCs and short atrial runs. 2D echo performed because of his soft murmur revealed a bicuspid aortic valve with mild stenosis and mild to moderate aortic insufficiency with normal LV size and function. There was noted mild dilatation of the thoracic aorta. He also was experiences a  quivering sensation in his chest.  He has denied chest pain or shortness of breath in the past.  I referred him to Dr. [name] and Dr. [name] as well.  Ultimately he was begun on Eliquis oral anticoagulation.  A chest CTA was performed 10/31/2017 revealing a 4.9 cm ascending thoracic aortic aneurysm.  This will be followed on an semiannual basis.  He will ultimately need thoracic aortic repair as well as AVR.  It is possible that he would also benefit from a surgical maze procedure during the operation and removal of his left atrial appendage but I will leave this to Dr. [name] to decide.  Since I saw him in the office a month ago he is done well.  He continues to take his blood pressure medicines, lisinopril, and as needed metoprolol for tachypalpitations.  I reviewed his blood pressure log which reveals blood pressures that are fairly well controlled.  We talked about type of exercise and limitations given his thoracic aortic aneurysm.  Prior CV studies:   The following studies were reviewed today:  2D echocardiogram, chest CTA  Past Medical History:  Diagnosis Date  . Aortic aneurysm ([facility])   . Aortic insufficiency due to bicuspid aortic valve   . Aortic stenosis due to bicuspid aortic valve   . Atrial flutter ([place])   . Bicuspid aortic valve   . GERD (gastroesophageal reflux disease)    past hx- stopped alcohol and no more issues   . NSVT (nonsustained ventricular tachycardia) ([place])   . Paroxysmal atrial fibrillation ([facility])   . PVC's (premature ventricular contractions)   .  RBBB    Past Surgical History:  Procedure Laterality Date  . COLONOSCOPY     12 yrs ago [facility]- normal per pt   . CYST EXCISION     left knee   . CYST EXCISION     tongue     Current Meds  Medication Sig  . apixaban (ELIQUIS) 5 MG TABS tablet Take 1 tablet (5 mg total) by mouth 2 (two) times daily.  . Cholecalciferol (HM VITAMIN D3) 4000 units CAPS Take 4,000 Units by mouth daily.  . Cyanocobalamin (VITAMIN  B-12 CR) 1500 MCG TBCR Take 1 tablet by mouth daily.  Marland Kitchen GLUCOSAMINE HCL PO Take 2,000 mg by mouth daily.  Marland Kitchen lisinopril (PRINIVIL,ZESTRIL) 5 MG tablet Take 1 tablet (5 mg total) by mouth daily.  . Omega-3 Fatty Acids (FISH OIL) 1000 MG CAPS Take by mouth.     Allergies:   Patient has no known allergies.   Social History   Tobacco Use  . Smoking status: Former Smoker    Years: 18.00    Types: Pipe  . Smokeless tobacco: Never Used  . Tobacco comment: smoked a pipe  Substance Use Topics  . Alcohol use: Not Currently  . Drug use: Never     Family Hx: The patient's family history includes COPD in his brother and sister; Colon polyps in his sister; Heart disease in his mother; Hypertension in his brother and father; Stroke in his father. There is no history of Colon cancer, Esophageal cancer, Rectal cancer, or Stomach cancer.  ROS:   Please see the history of present illness.     All other systems reviewed and are negative.   Labs/Other Tests and Data Reviewed:    Recent Labs: 09/03/2017: TSH 1.47 02/10/2018: ALT 20 02/27/2018: Hemoglobin 13.6; Platelets 185 03/23/2018: BUN 13; Creatinine, Ser 0.83; Potassium 5.4; Sodium 141   Recent Lipid Panel Lab Results  Component Value Date/Time   CHOL 160 09/03/2017 09:34 AM   TRIG 44 09/03/2017 09:34 AM   HDL 44 09/03/2017 09:34 AM   CHOLHDL 3.6 09/03/2017 09:34 AM   LDLCALC 103 (H) 09/03/2017 09:34 AM    Wt Readings from Last 3 Encounters:  04/17/18 199 lb (90.3 kg)  03/13/18 202 lb 6.4 oz (91.8 kg)  02/27/18 204 lb (92.5 kg)     Objective:    Vital Signs:  BP (!) 147/88 Comment: may need BP cuff calibration in the future  Pulse 63   Wt 199 lb (90.3 kg)   BMI 26.25 kg/m    A physical exam was not performed today since this was a telemedicine phone visit.  ASSESSMENT & PLAN:    1.  Thoracic aortic aneurysm- this measured 4.9 cm at the time of his last CTA 10/31/2017.  I did refer him to Dr. [name], thoracic surgeon, for  evaluation.  He is scheduled for repeat CTA in June.  We did discuss slow progression of thoracic aortic aneurysms, the optimal time of intervention and the extent of the procedure.  2.  Essential hypertension- blood pressure measured today by himself several minutes before the visit was 138/80 with a pulse of 59.  He is on lisinopril.  3.  Bicuspid aortic valve-2D echocardiogram performed 09/09/2017 did show a bicuspid aortic valve with mild aortic stenosis and mild to moderate aortic insufficiency with normal LV size and function.  This will be repeated on an annual basis.  4.  Palpitations- patient on Eliquis for short atrial runs seen on event monitoring.  He has  been followed by Dr. [name] for this.  In the future, should he require thoracic aortic root replacement, consideration can also be given to left atrial surgical maze procedure as well as clipping of his left atrial appendage.  Certainly, this will be left up to Dr. Vivi Martens discretion.   COVID-19 Education: The signs and symptoms of COVID-19 were discussed with the patient and how to seek care for testing (follow up with PCP or arrange E-visit).  The importance of social distancing was discussed today.  Patient Risk:   After full review of this patient's clinical status, I feel that they are at least moderate risk at this time.  Time:   Today, I have spent 25 minutes with the patient with telehealth technology discussing the patient's symptoms, review of his blood pressure log, his prior imaging studies including 2D echo and aortic CTA, COVID-19 symptomatology and limitations in general..     Medication Adjustments/Labs and Tests Ordered: Current medicines are reviewed at length with the patient today.  Concerns regarding medicines are outlined above.  Tests Ordered: No orders of the defined types were placed in this encounter.  Medication Changes: No orders of the defined types were placed in this encounter.   Disposition:   Follow up in 6 month(s)  Signed, Quay Burow, MD  04/17/2018 8:32 AM    [facility]

## 2018-04-17 NOTE — Telephone Encounter (Signed)
Patient and/or DPR-approved person aware of AVS instructions and verbalized understanding. 

## 2018-04-20 ENCOUNTER — Telehealth: Payer: Self-pay

## 2018-04-23 ENCOUNTER — Other Ambulatory Visit: Payer: Self-pay | Admitting: *Deleted

## 2018-04-23 ENCOUNTER — Telehealth: Payer: Self-pay | Admitting: *Deleted

## 2018-04-23 DIAGNOSIS — I10 Essential (primary) hypertension: Secondary | ICD-10-CM

## 2018-04-23 NOTE — Telephone Encounter (Signed)
Called patient moved CT appt due to COVID restrictions. Patient aware.  CT Urgency  Message  Received: Today  Message Contents  [place], Haze Justin, LPN  [place], Deatra [place], [place]        Erline Levine, Mr. Sheek's CTA can be delayed 3 to 4 months safely.   Previous Messages

## 2018-05-04 ENCOUNTER — Inpatient Hospital Stay: Admission: RE | Admit: 2018-05-04 | Payer: PPO | Source: Ambulatory Visit

## 2018-07-01 ENCOUNTER — Ambulatory Visit: Payer: PPO | Admitting: Surgery

## 2018-07-20 ENCOUNTER — Other Ambulatory Visit: Payer: PPO | Admitting: *Deleted

## 2018-07-20 ENCOUNTER — Other Ambulatory Visit: Payer: Self-pay

## 2018-07-20 DIAGNOSIS — I10 Essential (primary) hypertension: Secondary | ICD-10-CM | POA: Diagnosis not present

## 2018-07-20 LAB — BASIC METABOLIC PANEL
BUN/Creatinine Ratio: 16 (ref 10–24)
BUN: 12 mg/dL (ref 8–27)
CO2: 24 mmol/L (ref 20–29)
Calcium: 9.1 mg/dL (ref 8.6–10.2)
Chloride: 104 mmol/L (ref 96–106)
Creatinine, Ser: 0.74 mg/dL — ABNORMAL LOW (ref 0.76–1.27)
GFR calc Af Amer: 106 mL/min/{1.73_m2} (ref >59)
GFR calc non Af Amer: 92 mL/min/{1.73_m2} (ref >59)
Glucose: 88 mg/dL (ref 65–99)
Potassium: 4.4 mmol/L (ref 3.5–5.2)
Sodium: 139 mmol/L (ref 134–144)

## 2018-07-24 ENCOUNTER — Telehealth: Payer: Self-pay | Admitting: Cardiovascular Disease

## 2018-07-24 NOTE — Telephone Encounter (Signed)
Script Screening patients for COVID-19 and reviewing new operational procedures  Greeting - The reason I am calling is to share with you some new changes to our processes that are designed to help [place] keep everyone safe. Is now a good time to speak with you? Patient says "no' - ask them when you can call back and let them know it's important to do this prior to their appointment.  Patient says "yes" - Doristine Devoid, Rames[place] the first thing I need to do is ask you some screening Questions.  1. To the best of your knowledge, have you been in close contact with any one with a confirmed diagnosis of COVID 19? o No - proceed to next question  2. Have you had any one or more of the following: fever, chills, cough, shortness of breath or any flu-like symptoms? o No - proceed to next question  3. Have you been diagnosed with or have a previous diagnosis of COVID 19? o No - proceed to next question  4. I am going to go over a few other symptoms with you. Please let me know if you are experiencing any of the following: . Ear, nose or throat discomfort . A sore throat . Headache . Muscle pain . Diarrhea . Loss of taste or smell o No - proceed to next question  Thank you for answering these questions. Please know we will ask you these questions or similar questions when you arrive for your appointment and again it's how we are keeping everyone safe. Also, to keep you safe, please use the provided hand sanitizer when you enter the building. (Insert pt name), we are asking everyone in the building to wear a mask because they help [place] prevent the spread of germs. Do you have a mask of your own, if not, we are happy to provide one for you. The last thing I want to go over with you is the no visitor guidelines. This means no one can attend the appointment with you unless you need physical assistance. I understand this may be different from your past appointments and I know this may be difficult but please  know if someone is driving you we are happy to call them for you once your appointment is over.  [INSERT [place]  (Insert pt name) I've given you a lot of information, what questions do you have about what I've talked about today or your appointment tomorrow?  Benjie Karvonen, CMA

## 2018-07-27 ENCOUNTER — Ambulatory Visit (INDEPENDENT_AMBULATORY_CARE_PROVIDER_SITE_OTHER)
Admission: RE | Admit: 2018-07-27 | Discharge: 2018-07-27 | Disposition: A | Discharge status: Home / Self Care | Payer: PPO | Source: Ambulatory Visit | Attending: Cardiovascular Disease | Admitting: Cardiovascular Disease

## 2018-07-27 ENCOUNTER — Other Ambulatory Visit: Payer: Self-pay

## 2018-07-27 DIAGNOSIS — I712 Thoracic aortic aneurysm, without rupture, unspecified: Secondary | ICD-10-CM

## 2018-07-27 MED ORDER — IOHEXOL 350 MG/ML SOLN
100.0000 mL | Freq: Once | INTRAVENOUS | Status: AC | PRN
  Administered 2018-07-27: 100 mL via INTRAVENOUS

## 2018-07-28 ENCOUNTER — Other Ambulatory Visit: Payer: Self-pay

## 2018-07-29 ENCOUNTER — Ambulatory Visit: Payer: PPO | Admitting: Surgery

## 2018-07-29 ENCOUNTER — Encounter: Payer: Self-pay | Admitting: Surgery

## 2018-07-29 VITALS — BP 137/79 | HR 63 | Temp 97.3°F | Resp 16 | Ht 73.0 in | Wt 185.0 lb

## 2018-07-29 DIAGNOSIS — Q231 Congenital insufficiency of aortic valve: Secondary | ICD-10-CM

## 2018-07-29 DIAGNOSIS — I712 Thoracic aortic aneurysm, without rupture, unspecified: Secondary | ICD-10-CM

## 2018-07-29 NOTE — Progress Notes (Signed)
HPI:  The patient returns today for follow-up of his bicuspid aortic valve and ascending aortic aneurysm. This 73 year old gentleman has a bicuspid aortic valve with moderate aortic stenosis and mild to moderate aortic insufficiency as well as a 4.9 cm aortic root at the sinus level and 4.8 cm fusiform ascending aortic aneurysm.  His left ventricular ejection fraction is 55 to 60% and left ventricular internal dimensions are within normal limits.  His aortic valve stenosis and regurgitation were not felt to be severe enough to cause symptoms with a mean aortic valve gradient of 16 mmHg and an aortic regurgitation pressure half-time of 634 ms.  Since I last saw him in December he has been feeling well.  He is still quite active working outside.  He said that he can still work 6 to 8 hours outside but is somewhat tired after that as expected.  He is still walking daily without chest pain or shortness of breath.  He has had no dizziness or syncope.  Denies any peripheral edema.  Current Outpatient Medications  Medication Sig Dispense Refill  . apixaban (ELIQUIS) 5 MG TABS tablet Take 1 tablet (5 mg total) by mouth 2 (two) times daily. 60 tablet 6  . Cholecalciferol (HM VITAMIN D3) 4000 units CAPS Take 4,000 Units by mouth daily.    . Cyanocobalamin (VITAMIN B-12 CR) 1500 MCG TBCR Take 1 tablet by mouth daily.    Marland Kitchen GLUCOSAMINE HCL PO Take 2,000 mg by mouth daily.    Marland Kitchen lisinopril (PRINIVIL,ZESTRIL) 5 MG tablet Take 1 tablet (5 mg total) by mouth daily. 90 tablet 3  . metoprolol tartrate (LOPRESSOR) 25 MG tablet Take 1 tablet (25 mg total) by mouth every 6 (six) hours as needed (palpitations). 30 tablet 1  . Omega-3 Fatty Acids (FISH OIL) 1000 MG CAPS Take by mouth.     No current facility-administered medications for this visit.      Physical Exam: Blood pressure 137/79, pulse 63, temperature (!) 97.3 F (36.3 C), resp. rate 16, height 6\' 1"  (1.854 m), weight 185 lb (83.9 kg), SpO2 96 %. He  looks well. Cardiac exam shows a regular rate and rhythm with a 3/6 systolic murmur along the right sternal border.  There is no diastolic murmur. Lungs are clear. There is no peripheral edema.   Diagnostic Tests:  CLINICAL DATA:  Thoracic aortic aneurysm.  EXAM: CT ANGIOGRAPHY CHEST WITH CONTRAST  TECHNIQUE: Multidetector CT imaging of the chest was performed using the standard protocol during bolus administration of intravenous contrast. Multiplanar CT image reconstructions and MIPs were obtained to evaluate the vascular anatomy.  CONTRAST:  172mL OMNIPAQUE IOHEXOL 350 MG/ML SOLN  COMPARISON:  CT scan of October 31, 2017.  FINDINGS: Cardiovascular: Grossly stable 4.8 cm ascending thoracic aortic aneurysm is noted. No dissection is noted. Great vessels are widely patent. Transverse aortic arch measures 3.3 cm. Descending thoracic aorta has maximum measured diameter 3.3 cm. Normal cardiac size. No pericardial effusion.  Mediastinum/Nodes: No enlarged mediastinal, hilar, or axillary lymph nodes. Thyroid gland, trachea, and esophagus demonstrate no significant findings.  Lungs/Pleura: Lungs are clear. No pleural effusion or pneumothorax.  Upper Abdomen: No acute abnormality.  Musculoskeletal: No chest wall abnormality. No acute or significant osseous findings.  Review of the MIP images confirms the above findings.  IMPRESSION: Grossly stable 4.8 cm ascending thoracic aortic aneurysm. Recommend semi-annual imaging followup by CTA or MRA and referral to cardiothoracic surgery if not already obtained. This recommendation follows 2010 ACCF/AHA/AATS/ACR/ASA/SCA/SCAI/SIR/STS/SVM Guidelines for  the Diagnosis and Management of Patients With Thoracic Aortic Disease. Circulation. 2010; 121: J030-S923. Aortic aneurysm NOS (ICD10-I71.9).   Electronically Signed   By: Marijo Conception M.D.   On: 07/27/2018 09:29   Impression:  This 73 year old gentleman has a  bicuspid aortic valve that is calcified with an echocardiogram last October showing mild to moderate aortic stenosis with a mean gradient of 16 mmHg and mild aortic insufficiency.  He also has a 4.9 cm aortic root at the sinus level and a 4.8 cm fusiform ascending aortic aneurysm.  His aorta has been stable compared to CTA 6 months ago.  His aorta is still below the surgical threshold and do not think there is any indication for surgery at this time.  He is doing very well clinically.  I recommended that he have a follow-up CTA in 6 months and he is already scheduled for a repeat 2D echo in October 2020.  I reviewed the CTA images with him today and answered all of his questions.  I encouraged him to remain active but asked him not to do any heavy lifting of more than 35 pounds.  Plan:  He will have a follow-up echocardiogram with Dr. Gwenlyn Found in October 2020 and I will plan to see him back in 6 months with a CTA of the chest.  I spent 15 minutes performing this established patient evaluation and > 50% of this time was spent face to face counseling and coordinating the care of this patient's aortic aneurysm.    Gaye Pollack, MD [facility] [phone]

## 2018-08-03 ENCOUNTER — Other Ambulatory Visit: Payer: Self-pay

## 2018-08-03 ENCOUNTER — Encounter (HOSPITAL_COMMUNITY): Payer: Self-pay | Admitting: Nurse Practitioner

## 2018-08-03 ENCOUNTER — Ambulatory Visit (HOSPITAL_COMMUNITY)
Admission: RE | Admit: 2018-08-03 | Discharge: 2018-08-03 | Disposition: A | Discharge status: Home / Self Care | Payer: PPO | Source: Ambulatory Visit | Attending: Nurse Practitioner | Admitting: Nurse Practitioner

## 2018-08-03 VITALS — BP 112/56 | HR 61 | Ht 73.0 in | Wt 187.0 lb

## 2018-08-03 DIAGNOSIS — I4510 Unspecified right bundle-branch block: Secondary | ICD-10-CM | POA: Insufficient documentation

## 2018-08-03 DIAGNOSIS — R0683 Snoring: Secondary | ICD-10-CM | POA: Diagnosis not present

## 2018-08-03 DIAGNOSIS — I472 Ventricular tachycardia: Secondary | ICD-10-CM | POA: Diagnosis not present

## 2018-08-03 DIAGNOSIS — Q231 Congenital insufficiency of aortic valve: Secondary | ICD-10-CM | POA: Diagnosis not present

## 2018-08-03 DIAGNOSIS — I719 Aortic aneurysm of unspecified site, without rupture: Secondary | ICD-10-CM | POA: Diagnosis not present

## 2018-08-03 DIAGNOSIS — K219 Gastro-esophageal reflux disease without esophagitis: Secondary | ICD-10-CM | POA: Diagnosis not present

## 2018-08-03 DIAGNOSIS — Z7901 Long term (current) use of anticoagulants: Secondary | ICD-10-CM | POA: Diagnosis not present

## 2018-08-03 DIAGNOSIS — Z79899 Other long term (current) drug therapy: Secondary | ICD-10-CM | POA: Insufficient documentation

## 2018-08-03 DIAGNOSIS — Z87891 Personal history of nicotine dependence: Secondary | ICD-10-CM | POA: Diagnosis not present

## 2018-08-03 DIAGNOSIS — I480 Paroxysmal atrial fibrillation: Secondary | ICD-10-CM | POA: Diagnosis not present

## 2018-08-03 DIAGNOSIS — Z8249 Family history of ischemic heart disease and other diseases of the circulatory system: Secondary | ICD-10-CM | POA: Diagnosis not present

## 2018-08-03 DIAGNOSIS — R0681 Apnea, not elsewhere classified: Secondary | ICD-10-CM

## 2018-08-03 MED ORDER — METOPROLOL TARTRATE 25 MG PO TABS: 12.5000 mg | ORAL_TABLET | Freq: Four times a day (QID) | ORAL | 1 refills | Status: DC | PRN

## 2018-08-03 NOTE — Patient Instructions (Signed)
Decrease your as needed Metoprolol to 12.5 mg (1/2 tablet) as needed for episodes  I have put in an order/referral for sleep study and someone from that office will call you to make an appt.    We would like to see you back in about a week for an EKG, This has been scheduled

## 2018-08-03 NOTE — Progress Notes (Signed)
Primary Care Physician: [name], PA-C Referring Physician: Dr. [name]  Cardiologist: Dr. [name] is a 73 y.o. male with a h/o atrial arrhythmia's and NSVT, AI/AS, bicuspid aortic valve and aortic aneurysm. He is being followed by Dr. [name]. At the time that [name] saw the pt, he felt the arrhythmia's were better controlled with alcohol and caffeine avoidance. He also was noted to have sinus brady and more so nocturnal bradycardia, limiting AAD options. He had a CHA2DS2VASc score of 2, anticoagulation was recommended but pt was not ready to start.  In the Afib clinic, still reports that he notices very little irregular heart beat, sometimes very early in the am when he first wakes up.  Discussed anticoagulation guidelines again and he is now ready to start.  F/u in afib clinic, 2/14. He reports that he had hematuria x 2 days right after staring anticoagulation. He went to PCP, urine tests and PSA were negative, The bleeding spontaneously cleared and has not returned. He has had this intermittently over the years. He reports no episodes of afib.  Follow up in the AF clinic 08/03/18. Patient reports that he has done well for several months with minimal, if any, symptoms of afib until this morning. He woke with symptoms of heart racing and weakness and his BP monitor showed a heart rate of 140. He took his PRN BB which slowed his HR <100 bpm. He also had some low BP readings after taking the BB. He remains in rate controlled afib. There were no specific triggers that the patient could identify. He does admit to snoring and witnessed apneic episodes.   Today, he denies symptoms of chest pain, shortness of breath, orthopnea, PND, lower extremity edema, dizziness, presyncope, syncope, or neurologic sequela. The patient is tolerating medications without difficulties and is otherwise without complaint today.   Past Medical History:  Diagnosis Date  . Aortic aneurysm ([place])   .  Aortic insufficiency due to bicuspid aortic valve   . Aortic stenosis due to bicuspid aortic valve   . Atrial flutter ([place])   . Bicuspid aortic valve   . GERD (gastroesophageal reflux disease)    past hx- stopped alcohol and no more issues   . NSVT (nonsustained ventricular tachycardia) ([place])   . Paroxysmal atrial fibrillation ([facility])   . PVC's (premature ventricular contractions)   . RBBB    Past Surgical History:  Procedure Laterality Date  . COLONOSCOPY     12 yrs ago [facility]- normal per pt   . CYST EXCISION     left knee   . CYST EXCISION     tongue    Current Outpatient Medications  Medication Sig Dispense Refill  . apixaban (ELIQUIS) 5 MG TABS tablet Take 1 tablet (5 mg total) by mouth 2 (two) times daily. 60 tablet 6  . Cholecalciferol (HM VITAMIN D3) 4000 units CAPS Take 4,000 Units by mouth daily.    . Cyanocobalamin (VITAMIN B-12 CR) 1500 MCG TBCR Take 1 tablet by mouth daily.    Marland Kitchen GLUCOSAMINE HCL PO Take 2,000 mg by mouth daily.    Marland Kitchen lisinopril (PRINIVIL,ZESTRIL) 5 MG tablet Take 1 tablet (5 mg total) by mouth daily. 90 tablet 3  . metoprolol tartrate (LOPRESSOR) 25 MG tablet Take 1 tablet (25 mg total) by mouth every 6 (six) hours as needed (palpitations). 30 tablet 1  . Omega-3 Fatty Acids (FISH OIL) 1000 MG CAPS Take by mouth.     No current facility-administered medications  for this encounter.     No Known Allergies  Social History   Socioeconomic History  . Marital status: Married    Spouse name: Not on file  . Number of children: Not on file  . Years of education: Not on file  . Highest education level: Not on file  Occupational History  . 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 Financial resource strain: Not on file  . Food insecurity    Worry: Not on file    Inability: Not on file  . Transportation needs    Medical: Not on file    Non-medical: Not on file  Tobacco Use  . Smoking status: Former Smoker     Years: 18.00    Types: Pipe  . Smokeless tobacco: Never Used  . Tobacco comment: smoked a pipe  Substance and Sexual Activity  . Alcohol use: Not Currently  . Drug use: Never  . Sexual activity: Yes  Lifestyle  . Physical activity    Days per week: Not on file    Minutes per session: Not on file  . Stress: Not on file  Relationships  . Social Herbalist on phone: Not on file    Gets together: Not on file    Attends religious service: Not on file    Active member of club or organization: Not on file    Attends meetings of clubs or organizations: Not on file    Relationship status: Not on file  . Intimate partner violence    Fear of current or ex partner: Not on file    Emotionally abused: Not on file    Physically abused: Not on file    Forced sexual activity: Not on file  Other Topics Concern  . Not on file  Social History Narrative   Lives in [place]   Retired English as a second language teacher    Family History  Problem Relation Age of Onset  . Heart disease Mother   . Hypertension Father   . Stroke Father   . COPD Sister   . Colon polyps Sister   . Hypertension Brother   . COPD Brother   . Colon cancer Neg Hx   . Esophageal cancer Neg Hx   . Rectal cancer Neg Hx   . Stomach cancer Neg Hx     ROS- All systems are reviewed and negative except as per the HPI above  Physical Exam: Vitals:   08/03/18 1429  BP: (!) 112/56  Pulse: 61  Weight: 84.8 kg  Height: 6\' 1"  (1.854 m)   Wt Readings from Last 3 Encounters:  08/03/18 84.8 kg  07/29/18 83.9 kg  04/17/18 90.3 kg    Labs: Lab Results  Component Value Date   NA 139 07/20/2018   K 4.4 07/20/2018   CL 104 07/20/2018   CO2 24 07/20/2018   GLUCOSE 88 07/20/2018   BUN 12 07/20/2018   CREATININE 0.74 (L) 07/20/2018   CALCIUM 9.1 07/20/2018   No results found for: INR Lab Results  Component Value Date   CHOL 160 09/03/2017   HDL 44 09/03/2017   LDLCALC 103 (H) 09/03/2017   TRIG 44 09/03/2017    GEN- The  patient is well appearing elderly male, alert and oriented x 3 today.   HEENT-head normocephalic, atraumatic, sclera clear, conjunctiva pink, hearing intact, trachea midline. Lungs- Clear to ausculation bilaterally, normal work of breathing Heart- irregular rate and rhythm, no murmurs, rubs  or gallops  GI- soft, NT, ND, + BS Extremities- no clubbing, cyanosis, or edema MS- no significant deformity or atrophy Skin- no rash or lesion Psych- euthymic mood, full affect Neuro- strength and sensation are intact   EKG- afib HR 61, LAD, inc RBBB, QRS 98, QTc 402   Echo 09/09/17 - Left ventricle: The cavity size was normal. Wall thickness was   normal. Systolic function was normal. The estimated ejection   fraction was in the range of 55% to 60%. Wall motion was normal;   there were no regional wall motion abnormalities. Left   ventricular diastolic function parameters were normal. - Aortic valve: Bicuspid; mildly thickened, mildly calcified   leaflets. There was mild stenosis. There was mild to moderate   regurgitation directed eccentrically in the LVOT and towards the   mitral anterior leaflet. - Aortic root: The aortic root was moderately dilated. - Mitral valve: There was mild systolic anterior motion of the   chordal structures, but there is no outflow tract obstruction.. - Right ventricle: The cavity size was mildly dilated.  Nuclear stress test 12/02/17  Nuclear stress EF: 53%. The left ventricular ejection fraction is mildly decreased (45-54%).  There was no ST segment deviation noted during stress.  Defect 1: There is a fixed medium defect of moderate severity present in the basal inferoseptal, mid inferoseptal, apical septal and apex location. This appears to be most consistent with artifact we cannot rule out a previous subendocardial infarction.  This is a low risk study.   Assessment and Plan: 1. Paroxysmal  afib/atrial flutter   Previously well controlled until today  with symptomatic episode. Currently rate controlled. Sinus bradycardia  limits treatment options. General education about afib discussed and questions answered. We discussed treatment options including DCCV and Tikosyn.  For now, patient would like to wait on DCCV and see if he goes back in to [place] on his own like he has in the past. If he is persistent at f/u, will pursue DCCV. He would like to avoid ADD at this time. Will decrease dose of PRN metoprolol to 12.5 mg given hypotension. If he does have aortic valve surgery, MAZE and LAA amputation could be considered at the discretion of the CT surgery team.  This patients CHA2DS2-VASc Score and unadjusted Ischemic Stroke Rate (% per year) is equal to 2.2 % stroke rate/year from a score of 2  Above score calculated as 1 point each if present [CHF, HTN, DM, Vascular=MI/PAD/Aortic Plaque, Age if 65-74, or Male] Above score calculated as 2 points each if present [Age > 75, or Stroke/TIA/TE]  2. Snoring/witnessed apneic episodes Patient having symptoms of snoring and witnessed apnea per wife. We discussed the importance of treating OSA in order to maintain SR long term. Will arrange for sleep study.  3. NSVT Seen by Dr [name], asymptomatic, normal EF No workup planned  4. Bicuspid Aorta with AI/AS, aortic aneurysm Followed by Dr. [name]. Repeat echo scheduled for 10/2018.   F/u in the AF clinic in 1 week.   [facility] [address] [place], [place] [address] [phone]

## 2018-08-04 ENCOUNTER — Encounter: Payer: Self-pay | Admitting: Family Medicine

## 2018-08-04 ENCOUNTER — Telehealth: Payer: Self-pay | Admitting: *Deleted

## 2018-08-04 NOTE — Telephone Encounter (Signed)
-----   Message from Iona Hansen, [name] sent at 08/03/2018  3:37 PM EDT ----- Regarding: sleep study Please call this patient to schedule for a sleep study due to witnessed apnea and snoring.    Thank you  Estill Bamberg

## 2018-08-10 ENCOUNTER — Encounter (HOSPITAL_COMMUNITY): Payer: Self-pay | Admitting: Nurse Practitioner

## 2018-08-10 ENCOUNTER — Other Ambulatory Visit: Payer: Self-pay

## 2018-08-10 ENCOUNTER — Ambulatory Visit (HOSPITAL_COMMUNITY)
Admission: RE | Admit: 2018-08-10 | Discharge: 2018-08-10 | Disposition: A | Discharge status: Home / Self Care | Payer: PPO | Source: Ambulatory Visit | Attending: Nurse Practitioner | Admitting: Nurse Practitioner

## 2018-08-10 VITALS — BP 130/70 | HR 75

## 2018-08-10 DIAGNOSIS — R0683 Snoring: Secondary | ICD-10-CM | POA: Diagnosis not present

## 2018-08-10 DIAGNOSIS — Z8249 Family history of ischemic heart disease and other diseases of the circulatory system: Secondary | ICD-10-CM | POA: Insufficient documentation

## 2018-08-10 DIAGNOSIS — Z823 Family history of stroke: Secondary | ICD-10-CM | POA: Insufficient documentation

## 2018-08-10 DIAGNOSIS — Z87891 Personal history of nicotine dependence: Secondary | ICD-10-CM | POA: Insufficient documentation

## 2018-08-10 DIAGNOSIS — I4892 Unspecified atrial flutter: Secondary | ICD-10-CM | POA: Insufficient documentation

## 2018-08-10 DIAGNOSIS — Q231 Congenital insufficiency of aortic valve: Secondary | ICD-10-CM | POA: Diagnosis not present

## 2018-08-10 DIAGNOSIS — Z79899 Other long term (current) drug therapy: Secondary | ICD-10-CM | POA: Diagnosis not present

## 2018-08-10 DIAGNOSIS — I480 Paroxysmal atrial fibrillation: Secondary | ICD-10-CM | POA: Diagnosis not present

## 2018-08-10 DIAGNOSIS — I472 Ventricular tachycardia: Secondary | ICD-10-CM | POA: Diagnosis not present

## 2018-08-10 DIAGNOSIS — Z825 Family history of asthma and other chronic lower respiratory diseases: Secondary | ICD-10-CM | POA: Insufficient documentation

## 2018-08-10 DIAGNOSIS — I719 Aortic aneurysm of unspecified site, without rupture: Secondary | ICD-10-CM | POA: Insufficient documentation

## 2018-08-10 DIAGNOSIS — Z7901 Long term (current) use of anticoagulants: Secondary | ICD-10-CM | POA: Diagnosis not present

## 2018-08-10 DIAGNOSIS — I4891 Unspecified atrial fibrillation: Secondary | ICD-10-CM | POA: Diagnosis present

## 2018-08-10 LAB — CBC
HCT: 43.9 % (ref 39.0–52.0)
Hemoglobin: 14.3 g/dL (ref 13.0–17.0)
MCH: 30 pg (ref 26.0–34.0)
MCHC: 32.6 g/dL (ref 30.0–36.0)
MCV: 92 fL (ref 80.0–100.0)
Platelets: 218 10*3/uL (ref 150–400)
RBC: 4.77 MIL/uL (ref 4.22–5.81)
RDW: 14.1 % (ref 11.5–15.5)
WBC: 5.5 10*3/uL (ref 4.0–10.5)
nRBC: 0 % (ref 0.0–0.2)

## 2018-08-10 LAB — BASIC METABOLIC PANEL
Anion gap: 7 (ref 5–15)
BUN: 16 mg/dL (ref 8–23)
CO2: 26 mmol/L (ref 22–32)
Calcium: 9.3 mg/dL (ref 8.9–10.3)
Chloride: 106 mmol/L (ref 98–111)
Creatinine, Ser: 0.91 mg/dL (ref 0.61–1.24)
GFR calc Af Amer: >60 mL/min (ref >60)
GFR calc non Af Amer: >60 mL/min (ref >60)
Glucose, Bld: 104 mg/dL — ABNORMAL HIGH (ref 70–99)
Potassium: 5.4 mmol/L — ABNORMAL HIGH (ref 3.5–5.1)
Sodium: 139 mmol/L (ref 135–145)

## 2018-08-10 NOTE — Progress Notes (Signed)
Pt in for 1 week follow up with repeat EKG.  To be reviewed by Roderic Palau, NP

## 2018-08-10 NOTE — Addendum Note (Signed)
Encounter addended by: Sherran Needs, NP on: 08/10/2018 3:48 PM  Actions taken: Clinical Note Signed

## 2018-08-10 NOTE — Progress Notes (Addendum)
Primary Care Physician: Alycia Rossetti, MD Referring Physician: Dr. [name]  Cardiologist: Dr. [name] is a 73 y.o. male with a h/o atrial arrhythmia's and NSVT, AI/AS, bicuspid aortic valve and aortic aneurysm at 4.8 cm. He is being followed by Dr. [name]. When Dr. [name]  saw the pt in the past, he felt the arrhythmia's were better controlled with alcohol and caffeine avoidance. He also was noted to have sinus brady and more so nocturnal bradycardia, limiting AAD options. He had a CHA2DS2VASc score of 2, anticoagulation was recommended but pt was not ready to start.  In the Afib clinic, still reports that he notices very little irregular heart beat, sometimes very early in the am when he first wakes up.  Discussed anticoagulation guidelines again and he is now ready to start.  F/u in afib clinic, 02/27/17. He reports that he had hematuria x 2 days right after staring anticoagulation. He went to PCP, urine tests and PSA were negative, The bleeding spontaneously cleared and has not returned. He has had this intermittently over the years. He reports no episodes of afib.  Follow up in the AF clinic 08/03/18. Patient reports that he has done well for several months with minimal, if any, symptoms of afib until this morning. He woke with symptoms of heart racing and weakness and his BP monitor showed a heart rate of 140. He took his PRN BB which slowed his HR <100 bpm. He also had some low BP readings after taking the BB. He remains in rate controlled afib. There were no specific triggers that the patient could identify. He does admit to snoring and witnessed apneic episodes.   F/u in afib clinic, 7/27, he remains in  Afib, rate controlled. Cardioversion discussed and he wants to go ahead with the plan.   Today, he denies symptoms of chest pain, shortness of breath, orthopnea, PND, lower extremity edema, dizziness, presyncope, syncope, or neurologic sequela. The patient is tolerating  medications without difficulties and is otherwise without complaint today.   Past Medical History:  Diagnosis Date  . Aortic aneurysm ([place])   . Aortic insufficiency due to bicuspid aortic valve   . Aortic stenosis due to bicuspid aortic valve   . Atrial flutter ([place])   . Bicuspid aortic valve   . GERD (gastroesophageal reflux disease)    past hx- stopped alcohol and no more issues   . NSVT (nonsustained ventricular tachycardia) ([place])   . Paroxysmal atrial fibrillation ([facility])   . PVC's (premature ventricular contractions)   . RBBB    Past Surgical History:  Procedure Laterality Date  . COLONOSCOPY     12 yrs ago [facility]- normal per pt   . CYST EXCISION     left knee   . CYST EXCISION     tongue    Current Outpatient Medications  Medication Sig Dispense Refill  . apixaban (ELIQUIS) 5 MG TABS tablet Take 1 tablet (5 mg total) by mouth 2 (two) times daily. 60 tablet 6  . Cholecalciferol (HM VITAMIN D3) 4000 units CAPS Take 4,000 Units by mouth daily.    . Cyanocobalamin (VITAMIN B-12 CR) 1500 MCG TBCR Take 1 tablet by mouth daily.    Marland Kitchen GLUCOSAMINE HCL PO Take 2,000 mg by mouth daily.    Marland Kitchen lisinopril (PRINIVIL,ZESTRIL) 5 MG tablet Take 1 tablet (5 mg total) by mouth daily. 90 tablet 3  . metoprolol tartrate (LOPRESSOR) 25 MG tablet Take 0.5 tablets (12.5 mg total) by mouth  every 6 (six) hours as needed (palpitations). 30 tablet 1  . Omega-3 Fatty Acids (FISH OIL) 1000 MG CAPS Take by mouth.     No current facility-administered medications for this encounter.     No Known Allergies  Social History   Socioeconomic History  . Marital status: Married    Spouse name: Not on file  . Number of children: Not on file  . Years of education: Not on file  . Highest education level: Not on file  Occupational History  . 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 Financial resource strain: Not on file  . Food insecurity    Worry: Not on file     Inability: Not on file  . Transportation needs    Medical: Not on file    Non-medical: Not on file  Tobacco Use  . Smoking status: Former Smoker    Years: 18.00    Types: Pipe  . Smokeless tobacco: Never Used  . Tobacco comment: smoked a pipe  Substance and Sexual Activity  . Alcohol use: Not Currently  . Drug use: Never  . Sexual activity: Yes  Lifestyle  . Physical activity    Days per week: Not on file    Minutes per session: Not on file  . Stress: Not on file  Relationships  . Social Herbalist on phone: Not on file    Gets together: Not on file    Attends religious service: Not on file    Active member of club or organization: Not on file    Attends meetings of clubs or organizations: Not on file    Relationship status: Not on file  . Intimate partner violence    Fear of current or ex partner: Not on file    Emotionally abused: Not on file    Physically abused: Not on file    Forced sexual activity: Not on file  Other Topics Concern  . Not on file  Social History Narrative   Lives in [place]   Retired English as a second language teacher    Family History  Problem Relation Age of Onset  . Heart disease Mother   . Hypertension Father   . Stroke Father   . COPD Sister   . Colon polyps Sister   . Hypertension Brother   . COPD Brother   . Colon cancer Neg Hx   . Esophageal cancer Neg Hx   . Rectal cancer Neg Hx   . Stomach cancer Neg Hx     ROS- All systems are reviewed and negative except as per the HPI above  Physical Exam: Vitals:   08/10/18 1450  BP: 130/70  Pulse: 75   Wt Readings from Last 3 Encounters:  08/03/18 84.8 kg  07/29/18 83.9 kg  04/17/18 90.3 kg    Labs: Lab Results  Component Value Date   NA 139 07/20/2018   K 4.4 07/20/2018   CL 104 07/20/2018   CO2 24 07/20/2018   GLUCOSE 88 07/20/2018   BUN 12 07/20/2018   CREATININE 0.74 (L) 07/20/2018   CALCIUM 9.1 07/20/2018   No results found for: INR Lab Results  Component Value Date    CHOL 160 09/03/2017   HDL 44 09/03/2017   LDLCALC 103 (H) 09/03/2017   TRIG 44 09/03/2017    GEN- The patient is well appearing elderly male, alert and oriented x 3 today.   HEENT-head normocephalic, atraumatic, sclera clear, conjunctiva pink, hearing  intact, trachea midline. Lungs- Clear to ausculation bilaterally, normal work of breathing Heart- irregular rate and rhythm, no murmurs, rubs or gallops  GI- soft, NT, ND, + BS Extremities- no clubbing, cyanosis, or edema MS- no significant deformity or atrophy Skin- no rash or lesion Psych- euthymic mood, full affect Neuro- strength and sensation are intact   EKG- afib at 75 bpm, qrs int 98 ms, qtc, 415 ms   Echo 09/09/17 - Left ventricle: The cavity size was normal. Wall thickness was   normal. Systolic function was normal. The estimated ejection   fraction was in the range of 55% to 60%. Wall motion was normal;   there were no regional wall motion abnormalities. Left   ventricular diastolic function parameters were normal. - Aortic valve: Bicuspid; mildly thickened, mildly calcified   leaflets. There was mild stenosis. There was mild to moderate   regurgitation directed eccentrically in the LVOT and towards the   mitral anterior leaflet. - Aortic root: The aortic root was moderately dilated. - Mitral valve: There was mild systolic anterior motion of the   chordal structures, but there is no outflow tract obstruction.. - Right ventricle: The cavity size was mildly dilated.  Nuclear stress test 12/02/17  Nuclear stress EF: 53%. The left ventricular ejection fraction is mildly decreased (45-54%).  There was no ST segment deviation noted during stress.  Defect 1: There is a fixed medium defect of moderate severity present in the basal inferoseptal, mid inferoseptal, apical septal and apex location. This appears to be most consistent with artifact we cannot rule out a previous subendocardial infarction.  This is a low risk  study.   Assessment and Plan: 1. Paroxysmal  afib/atrial flutter   Previously well controlled until recently with symptomatic episode. Currently rate controlled. Sinus bradycardia  limits treatment options. General education about afib discussed and questions answered. We discussed treatment options including DCCV and Tikosyn.  He would like to proceed with cardioversion No missed doses of anticoagulation x at least 3 weeks Risks vrs benefit explained re procedure  Continue decreased dose of PRN metoprolol to 12.5 mg given previous  hypotension. If he does have aortic valve surgery, MAZE and LAA amputation could be considered at the discretion of the CT surgery team.  This patients CHA2DS2-VASc Score and unadjusted Ischemic Stroke Rate (% per year) is equal to 2.2 % stroke rate/year from a score of 2  Above score calculated as 1 point each if present [CHF, HTN, DM, Vascular=MI/PAD/Aortic Plaque, Age if 65-74, or Male] Above score calculated as 2 points each if present [Age > 75, or Stroke/TIA/TE]  2. Snoring/witnessed apneic episodes Patient having symptoms of snoring and witnessed apnea per wife. We discussed the importance of treating OSA in order to maintain SR long term. Will arrange for sleep study.  3. NSVT Seen by Dr [name], 12/15/17, asymptomatic, normal EF No workup planned  4. Bicuspid Aorta with AI/AS, aortic aneurysm Followed by Dr. [name]. Repeat echo scheduled for 10/2018.   F/u in the AF clinic in 1 week after cardioversion.   [facility] [address] [place], [place] [address] [phone]

## 2018-08-10 NOTE — Patient Instructions (Signed)
Cardioversion scheduled for Tuesday, August 4th  - Arrive at the [facility] and go to admitting at 1:30pm  -Do not eat or drink anything after midnight the night prior to your procedure.  - Take all your morning medication with a sip of water prior to arrival.  - You will not be able to drive home after your procedure.

## 2018-08-14 ENCOUNTER — Other Ambulatory Visit (HOSPITAL_COMMUNITY)
Admission: RE | Admit: 2018-08-14 | Discharge: 2018-08-14 | Disposition: A | Discharge status: Home / Self Care | Payer: PPO | Source: Ambulatory Visit | Attending: Cardiology | Admitting: Cardiology

## 2018-08-14 DIAGNOSIS — Z20828 Contact with and (suspected) exposure to other viral communicable diseases: Secondary | ICD-10-CM | POA: Diagnosis not present

## 2018-08-14 DIAGNOSIS — Z01812 Encounter for preprocedural laboratory examination: Secondary | ICD-10-CM | POA: Diagnosis not present

## 2018-08-14 LAB — SARS CORONAVIRUS 2 (TAT 6-24 HRS): SARS Coronavirus 2: NEGATIVE

## 2018-08-18 ENCOUNTER — Encounter (HOSPITAL_COMMUNITY)
Admission: RE | Disposition: A | Discharge status: Home / Self Care | Payer: Self-pay | Source: Home / Self Care | Attending: Cardiology

## 2018-08-18 ENCOUNTER — Ambulatory Visit (HOSPITAL_COMMUNITY)
Admission: RE | Admit: 2018-08-18 | Discharge: 2018-08-18 | Disposition: A | Discharge status: Home / Self Care | Payer: PPO | Attending: Cardiology | Admitting: Cardiology

## 2018-08-18 DIAGNOSIS — Z538 Procedure and treatment not carried out for other reasons: Secondary | ICD-10-CM | POA: Diagnosis not present

## 2018-08-18 DIAGNOSIS — I4891 Unspecified atrial fibrillation: Secondary | ICD-10-CM | POA: Diagnosis not present

## 2018-08-18 SURGERY — CANCELLED PROCEDURE

## 2018-08-18 NOTE — Progress Notes (Signed)
Pt presents today for cardioversion secondary to afib. Pt noted to be in NSR during admission. Dr Stanford Breed at bedside to speak with pt, EKG obtained.

## 2018-08-18 NOTE — H&P (Signed)
Pt presented for DCCV; noted to be in sinus; procedure canceled; continue present meds and FU as scheduled. Kirk Ruths

## 2018-08-24 NOTE — Telephone Encounter (Signed)
Chest ct 07/27/2018

## 2018-08-26 ENCOUNTER — Encounter (HOSPITAL_COMMUNITY): Payer: Self-pay | Admitting: Physician Assistant

## 2018-08-26 ENCOUNTER — Other Ambulatory Visit: Payer: Self-pay

## 2018-08-26 ENCOUNTER — Ambulatory Visit (HOSPITAL_COMMUNITY)
Admission: RE | Admit: 2018-08-26 | Discharge: 2018-08-26 | Disposition: A | Discharge status: Home / Self Care | Payer: PPO | Source: Ambulatory Visit | Attending: Physician Assistant | Admitting: Physician Assistant

## 2018-08-26 VITALS — BP 144/64 | HR 59 | Ht 73.0 in | Wt 190.8 lb

## 2018-08-26 DIAGNOSIS — I480 Paroxysmal atrial fibrillation: Secondary | ICD-10-CM

## 2018-08-26 DIAGNOSIS — Z823 Family history of stroke: Secondary | ICD-10-CM | POA: Diagnosis not present

## 2018-08-26 DIAGNOSIS — R0683 Snoring: Secondary | ICD-10-CM | POA: Insufficient documentation

## 2018-08-26 DIAGNOSIS — Z8249 Family history of ischemic heart disease and other diseases of the circulatory system: Secondary | ICD-10-CM | POA: Diagnosis not present

## 2018-08-26 DIAGNOSIS — I4892 Unspecified atrial flutter: Secondary | ICD-10-CM | POA: Insufficient documentation

## 2018-08-26 DIAGNOSIS — Z825 Family history of asthma and other chronic lower respiratory diseases: Secondary | ICD-10-CM | POA: Diagnosis not present

## 2018-08-26 DIAGNOSIS — I472 Ventricular tachycardia: Secondary | ICD-10-CM | POA: Diagnosis not present

## 2018-08-26 DIAGNOSIS — Z79899 Other long term (current) drug therapy: Secondary | ICD-10-CM | POA: Diagnosis not present

## 2018-08-26 DIAGNOSIS — I719 Aortic aneurysm of unspecified site, without rupture: Secondary | ICD-10-CM | POA: Diagnosis not present

## 2018-08-26 DIAGNOSIS — Z87891 Personal history of nicotine dependence: Secondary | ICD-10-CM | POA: Diagnosis not present

## 2018-08-26 DIAGNOSIS — Q231 Congenital insufficiency of aortic valve: Secondary | ICD-10-CM | POA: Insufficient documentation

## 2018-08-26 NOTE — Progress Notes (Signed)
Primary Care Physician: Alycia Rossetti, MD Referring Physician: Dr. [name]  Cardiologist: Dr. [name] is a 73 y.o. male with a h/o atrial arrhythmia's and NSVT, AI/AS, bicuspid aortic valve and aortic aneurysm at 4.8 cm. He is being followed by Dr. [name]. When Dr. [name]  saw the pt in the past, he felt the arrhythmia's were better controlled with alcohol and caffeine avoidance. He also was noted to have sinus brady and more so nocturnal bradycardia, limiting AAD options. He had a CHA2DS2VASc score of 2, anticoagulation was recommended but pt was not ready to start.  In the Afib clinic, still reports that he notices very little irregular heart beat, sometimes very early in the am when he first wakes up.  Discussed anticoagulation guidelines again and he is now ready to start.  Follow up in the AF clinic 08/03/18. Patient reports that he has done well for several months with minimal, if any, symptoms of afib until this morning. He woke with symptoms of heart racing and weakness and his BP monitor showed a heart rate of 140. He took his PRN BB which slowed his HR <100 bpm. He also had some low BP readings after taking the BB. He remains in rate controlled afib. There were no specific triggers that the patient could identify. He does admit to snoring and witnessed apneic episodes.   Patient presented for DCCV 08/18/18 and was in [place]. He reports that he feels well with no heart racing or palpitations. He is thinking about purchasing a Kardia device to track his afib in the future.   Today, he denies symptoms of palpitations, chest pain, shortness of breath, orthopnea, PND, lower extremity edema, dizziness, presyncope, syncope, or neurologic sequela. The patient is tolerating medications without difficulties and is otherwise without complaint today.   Past Medical History:  Diagnosis Date  . Aortic aneurysm ([place])   . Aortic insufficiency due to bicuspid aortic valve   . Aortic  stenosis due to bicuspid aortic valve   . Atrial flutter ([place])   . Bicuspid aortic valve   . GERD (gastroesophageal reflux disease)    past hx- stopped alcohol and no more issues   . NSVT (nonsustained ventricular tachycardia) ([place])   . Paroxysmal atrial fibrillation ([facility])   . PVC's (premature ventricular contractions)   . RBBB    Past Surgical History:  Procedure Laterality Date  . COLONOSCOPY     12 yrs ago [facility]- normal per pt   . CYST EXCISION     left knee   . CYST EXCISION     tongue    Current Outpatient Medications  Medication Sig Dispense Refill  . apixaban (ELIQUIS) 5 MG TABS tablet Take 1 tablet (5 mg total) by mouth 2 (two) times daily. 60 tablet 6  . Cholecalciferol (HM VITAMIN D3) 4000 units CAPS Take 4,000 Units by mouth daily.    . Cyanocobalamin (VITAMIN B-12 CR) 1500 MCG TBCR Take 1 tablet by mouth daily.    Marland Kitchen GLUCOSAMINE HCL PO Take 2,000 mg by mouth daily.    Marland Kitchen lisinopril (ZESTRIL) 5 MG tablet Take 5 mg by mouth daily.    . Omega-3 Fatty Acids (FISH OIL) 1000 MG CAPS Take 1,000 mg by mouth daily.     . metoprolol tartrate (LOPRESSOR) 25 MG tablet Take 0.5 tablets (12.5 mg total) by mouth every 6 (six) hours as needed (palpitations). (Patient not taking: Reported on 08/26/2018) 30 tablet 1   No current facility-administered medications  for this encounter.     No Known Allergies  Social History   Socioeconomic History  . Marital status: Married    Spouse name: Not on file  . Number of children: Not on file  . Years of education: Not on file  . Highest education level: Not on file  Occupational History  . 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 Financial resource strain: Not on file  . Food insecurity    Worry: Not on file    Inability: Not on file  . Transportation needs    Medical: Not on file    Non-medical: Not on file  Tobacco Use  . Smoking status: Former Smoker    Years: 18.00    Types: Pipe   . Smokeless tobacco: Never Used  . Tobacco comment: smoked a pipe  Substance and Sexual Activity  . Alcohol use: Not Currently  . Drug use: Never  . Sexual activity: Yes  Lifestyle  . Physical activity    Days per week: Not on file    Minutes per session: Not on file  . Stress: Not on file  Relationships  . Social Herbalist on phone: Not on file    Gets together: Not on file    Attends religious service: Not on file    Active member of club or organization: Not on file    Attends meetings of clubs or organizations: Not on file    Relationship status: Not on file  . Intimate partner violence    Fear of current or ex partner: Not on file    Emotionally abused: Not on file    Physically abused: Not on file    Forced sexual activity: Not on file  Other Topics Concern  . Not on file  Social History Narrative   Lives in [place]   Retired English as a second language teacher    Family History  Problem Relation Age of Onset  . Heart disease Mother   . Hypertension Father   . Stroke Father   . COPD Sister   . Colon polyps Sister   . Hypertension Brother   . COPD Brother   . Colon cancer Neg Hx   . Esophageal cancer Neg Hx   . Rectal cancer Neg Hx   . Stomach cancer Neg Hx     ROS- All systems are reviewed and negative except as per the HPI above  Physical Exam: Vitals:   08/26/18 1045  BP: (!) 144/64  Pulse: (!) 59  Weight: 86.5 kg  Height: 6\' 1"  (1.854 m)   Wt Readings from Last 3 Encounters:  08/26/18 86.5 kg  08/03/18 84.8 kg  07/29/18 83.9 kg    Labs: Lab Results  Component Value Date   NA 139 08/10/2018   K 5.4 (H) 08/10/2018   CL 106 08/10/2018   CO2 26 08/10/2018   GLUCOSE 104 (H) 08/10/2018   BUN 16 08/10/2018   CREATININE 0.91 08/10/2018   CALCIUM 9.3 08/10/2018   No results found for: INR Lab Results  Component Value Date   CHOL 160 09/03/2017   HDL 44 09/03/2017   LDLCALC 103 (H) 09/03/2017   TRIG 44 09/03/2017    GEN- The patient is well  appearing elderly male, alert and oriented x 3 today.   HEENT-head normocephalic, atraumatic, sclera clear, conjunctiva pink, hearing intact, trachea midline. Lungs- Clear to ausculation bilaterally, normal work of breathing Heart- Regular rate and rhythm, no  rubs or gallops. 3/6 systolic murmur  GI- soft, NT, ND, + BS Extremities- no clubbing, cyanosis, or edema MS- no significant deformity or atrophy Skin- no rash or lesion Psych- euthymic mood, full affect Neuro- strength and sensation are intact    EKG- SR HR 59, LAD, incRBBB, PR 190, QRS 102, QTc 405   Echo 09/09/17 - Left ventricle: The cavity size was normal. Wall thickness was   normal. Systolic function was normal. The estimated ejection   fraction was in the range of 55% to 60%. Wall motion was normal;   there were no regional wall motion abnormalities. Left   ventricular diastolic function parameters were normal. - Aortic valve: Bicuspid; mildly thickened, mildly calcified   leaflets. There was mild stenosis. There was mild to moderate   regurgitation directed eccentrically in the LVOT and towards the   mitral anterior leaflet. - Aortic root: The aortic root was moderately dilated. - Mitral valve: There was mild systolic anterior motion of the   chordal structures, but there is no outflow tract obstruction.. - Right ventricle: The cavity size was mildly dilated.  Nuclear stress test 12/02/17  Nuclear stress EF: 53%. The left ventricular ejection fraction is mildly decreased (45-54%).  There was no ST segment deviation noted during stress.  Defect 1: There is a fixed medium defect of moderate severity present in the basal inferoseptal, mid inferoseptal, apical septal and apex location. This appears to be most consistent with artifact we cannot rule out a previous subendocardial infarction.  This is a low risk study.   Assessment and Plan: 1. Paroxysmal  afib/atrial flutter   Patient presented for DCCV in [place].  Procedure was cancelled.  Continue PRN metoprolol 12.5 mg  Continue Eliquis 5 mg BID  If he does have aortic valve surgery, MAZE and LAA amputation could be considered at the discretion of the CT surgery team. We discussed wearable device technology such as Kardia for routine monitoring.  This patients CHA2DS2-VASc Score and unadjusted Ischemic Stroke Rate (% per year) is equal to 2.2 % stroke rate/year from a score of 2  Above score calculated as 1 point each if present [CHF, HTN, DM, Vascular=MI/PAD/Aortic Plaque, Age if 65-74, or Male] Above score calculated as 2 points each if present [Age > 75, or Stroke/TIA/TE]  2. Snoring/witnessed apneic episodes Patient having symptoms of snoring and witnessed apnea per wife. We discussed the importance of treating OSA in order to maintain SR long term. Sleep study pending.  3. NSVT Seen by Dr [name], 12/15/17, asymptomatic, normal EF No workup planned  4. Bicuspid Aorta with AI/AS, aortic aneurysm Followed by Dr. [name]. Repeat echo scheduled for 10/2018.   F/u with Dr Gwenlyn Found per recall. AF clinic in 6 months.   [facility] [address] [place], [place] [address] [phone]

## 2018-08-27 ENCOUNTER — Telehealth: Payer: Self-pay | Admitting: *Deleted

## 2018-08-27 NOTE — Telephone Encounter (Signed)
Faxed PA request for Sleep study to [facility].

## 2018-09-10 ENCOUNTER — Encounter (HOSPITAL_BASED_OUTPATIENT_CLINIC_OR_DEPARTMENT_OTHER): Payer: Self-pay

## 2018-09-10 ENCOUNTER — Telehealth: Payer: Self-pay | Admitting: *Deleted

## 2018-09-10 NOTE — Telephone Encounter (Signed)
Left sleep study appointment information with contact information on cell VM.

## 2018-09-14 ENCOUNTER — Other Ambulatory Visit (HOSPITAL_COMMUNITY): Payer: Self-pay | Admitting: Nurse Practitioner

## 2018-09-15 ENCOUNTER — Ambulatory Visit (INDEPENDENT_AMBULATORY_CARE_PROVIDER_SITE_OTHER): Payer: PPO

## 2018-09-15 ENCOUNTER — Other Ambulatory Visit: Payer: Self-pay

## 2018-09-15 ENCOUNTER — Encounter (HOSPITAL_BASED_OUTPATIENT_CLINIC_OR_DEPARTMENT_OTHER): Payer: PPO | Admitting: Cardiovascular Disease

## 2018-09-15 DIAGNOSIS — Z23 Encounter for immunization: Secondary | ICD-10-CM | POA: Diagnosis not present

## 2018-09-15 NOTE — Progress Notes (Signed)
Patent came in to receive his annual flu shot and his pneumovax 23. Patient was given the flu vaccine in the left deltoid. He tolerated well. Pneumovax 23 was given in the right deltoid. Patient tolerated well. VIS given on both vaccines.

## 2018-09-16 ENCOUNTER — Encounter (HOSPITAL_BASED_OUTPATIENT_CLINIC_OR_DEPARTMENT_OTHER): Payer: PPO | Admitting: Cardiovascular Disease

## 2018-10-14 DIAGNOSIS — L819 Disorder of pigmentation, unspecified: Secondary | ICD-10-CM | POA: Diagnosis not present

## 2018-10-14 DIAGNOSIS — L821 Other seborrheic keratosis: Secondary | ICD-10-CM | POA: Diagnosis not present

## 2018-10-14 DIAGNOSIS — D229 Melanocytic nevi, unspecified: Secondary | ICD-10-CM | POA: Diagnosis not present

## 2018-10-14 DIAGNOSIS — L814 Other melanin hyperpigmentation: Secondary | ICD-10-CM | POA: Diagnosis not present

## 2018-10-14 DIAGNOSIS — L570 Actinic keratosis: Secondary | ICD-10-CM | POA: Diagnosis not present

## 2018-10-22 ENCOUNTER — Other Ambulatory Visit: Payer: Self-pay

## 2018-10-22 ENCOUNTER — Ambulatory Visit (HOSPITAL_COMMUNITY): Payer: PPO | Attending: Cardiology

## 2018-10-22 DIAGNOSIS — I712 Thoracic aortic aneurysm, without rupture, unspecified: Secondary | ICD-10-CM

## 2018-10-22 DIAGNOSIS — I351 Nonrheumatic aortic (valve) insufficiency: Secondary | ICD-10-CM | POA: Insufficient documentation

## 2018-11-03 ENCOUNTER — Encounter: Payer: Self-pay | Admitting: Cardiovascular Disease

## 2018-11-03 ENCOUNTER — Ambulatory Visit: Payer: PPO | Admitting: Cardiovascular Disease

## 2018-11-03 ENCOUNTER — Other Ambulatory Visit: Payer: Self-pay

## 2018-11-03 VITALS — BP 144/68 | HR 59 | Temp 96.8°F | Ht 73.0 in | Wt 186.0 lb

## 2018-11-03 DIAGNOSIS — Q231 Congenital insufficiency of aortic valve: Secondary | ICD-10-CM | POA: Diagnosis not present

## 2018-11-03 DIAGNOSIS — I712 Thoracic aortic aneurysm, without rupture, unspecified: Secondary | ICD-10-CM

## 2018-11-03 DIAGNOSIS — E785 Hyperlipidemia, unspecified: Secondary | ICD-10-CM

## 2018-11-03 DIAGNOSIS — I350 Nonrheumatic aortic (valve) stenosis: Secondary | ICD-10-CM | POA: Diagnosis not present

## 2018-11-03 DIAGNOSIS — I351 Nonrheumatic aortic (valve) insufficiency: Secondary | ICD-10-CM

## 2018-11-03 DIAGNOSIS — I480 Paroxysmal atrial fibrillation: Secondary | ICD-10-CM

## 2018-11-03 NOTE — Patient Instructions (Addendum)
Echo august 2021 folowed by rov  Medication Instructions:  Your physician recommends that you continue on your current medications as directed. Please refer to the Current Medication list given to you today.  If you need a refill on your cardiac medications before your next appointment, please call your pharmacy.   Lab work: none If you have labs (blood work) drawn today and your tests are completely normal, you will receive your results only by: Marland Kitchen MyChart Message (if you have MyChart) OR . A paper copy in the mail If you have any lab test that is abnormal or we need to change your treatment, we will call you to review the results.  Testing/Procedures: Your physician has requested that you have an echocardiogram. Echocardiography is a painless test that uses sound waves to create images of your heart. It provides your doctor with information about the size and shape of your heart and how well your heart's chambers and valves are working. This procedure takes approximately one hour. There are no restrictions for this procedure. LOCATION: [facility] at [facility]: [place], [place], [place] [address] DUE AUGUST 2021     Follow-Up: At [facility], you and your health needs are our priority.  As part of our continuing mission to provide you with exceptional heart care, we have created designated Provider Care Teams.  These Care Teams include your primary Cardiologist (physician) and Advanced Practice Providers (APPs -  Physician Assistants and Nurse Practitioners) who all work together to provide you with the care you need, when you need it. You will need a follow up appointment with Dr. Quay Burow IN AUGUST 2021 AFTER YOUR ECHOCARDIOGRAM.  Please call our office 2 months in advance to schedule this appointment.

## 2018-11-03 NOTE — Assessment & Plan Note (Signed)
History of bicuspid aortic valve with mild stenosis although his most recent echo showed a tricuspid valve.  I am unclear about the disparity

## 2018-11-03 NOTE — Progress Notes (Signed)
11/03/2018 Jerry Velazquez   January 08, 1946  [id number]  Primary Physician Alycia Rossetti, MD Primary Cardiologist: Lorretta Harp MD Jerry Velazquez, [place]  HPI:  Jerry Velazquez is a 73 y.o.  thin appearing married Caucasian male father of no children who is recently retired Nurse, mental health. He was referred by Jerry Velazquez for cardiovascular evaluation because of aortic insufficiency.I last spoke to him during a virtual telemedicine phone visit on  04/17/2018. He has no cardiac risk factors. He was seen because of palpitations with a recent Holter monitor that showed PVCs and short atrial runs. 2D echo performed because of his soft murmur revealed a bicuspid aortic valve with mild stenosis and mild to moderate aortic insufficiency with normal LV size and function. There was noted mild dilatation of the thoracic aorta. He also was experiences a quivering sensation in his chest.  He has denied chest pain or shortness of breath in the past.  I referred him to Dr. Rayann Velazquez and Dr. Cyndia Velazquez as well. Ultimately he was begun on Eliquis oral anticoagulation. A chest CTA was performed 10/31/2017 revealing a 4.9 cm ascending thoracic aortic aneurysm. This will be followed on an semiannual basis. He will ultimately need thoracic aortic repair as well as AVR. It is possible that he would also benefit from a surgical maze procedure during the operation and removal of his left atrial appendage but I will leave this to Dr. Cyndia Velazquez to decide.  Since I spoke to him 6 months in the telephone he was scheduled for outpatient cardioversion by Dr. Stanford Velazquez on 08/18/2018 but was in sinus rhythm at the time.  He has curtailed his caffeine and alcohol intake.  His episodes of PAF form dramatically improved.  His most recent 2D echo performed 10/22/2018 revealed normal LV systolic function, mild left ear, mild to moderate AI with a aortic root measuring 50 mm.  He is fairly active and exercises  regularly without limitation.   Current Meds  Medication Sig  . Cholecalciferol (HM VITAMIN D3) 4000 units CAPS Take 4,000 Units by mouth daily.  . Cyanocobalamin (VITAMIN B-12 CR) 1500 MCG TBCR Take 1 tablet by mouth daily.  Marland Kitchen ELIQUIS 5 MG TABS tablet TAKE 1 TABLET BY MOUTH TWICE A DAY  . GLUCOSAMINE HCL PO Take 2,000 mg by mouth daily.  Marland Kitchen lisinopril (ZESTRIL) 5 MG tablet Take 5 mg by mouth daily.  . metoprolol tartrate (LOPRESSOR) 25 MG tablet Take 0.5 tablets (12.5 mg total) by mouth every 6 (six) hours as needed (palpitations).  . Omega-3 Fatty Acids (FISH OIL) 1000 MG CAPS Take 1,000 mg by mouth daily.      No Known Allergies  Social History   Socioeconomic History  . Marital status: Married    Spouse name: Not on file  . Number of children: Not on file  . Years of education: Not on file  . Highest education level: Not on file  Occupational History  . 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 Financial resource strain: Not on file  . Food insecurity    Worry: Not on file    Inability: Not on file  . Transportation needs    Medical: Not on file    Non-medical: Not on file  Tobacco Use  . Smoking status: Former Smoker    Years: 18.00    Types: Pipe  . Smokeless tobacco: Never Used  . Tobacco comment: smoked a  pipe  Substance and Sexual Activity  . Alcohol use: Not Currently  . Drug use: Never  . Sexual activity: Yes  Lifestyle  . Physical activity    Days per week: Not on file    Minutes per session: Not on file  . Stress: Not on file  Relationships  . Social Herbalist on phone: Not on file    Gets together: Not on file    Attends religious service: Not on file    Active member of club or organization: Not on file    Attends meetings of clubs or organizations: Not on file    Relationship status: Not on file  . Intimate partner violence    Fear of current or ex partner: Not on file    Emotionally  abused: Not on file    Physically abused: Not on file    Forced sexual activity: Not on file  Other Topics Concern  . Not on file  Social History Narrative   Lives in [place]   Retired English as a second language teacher     Review of Systems: General: negative for chills, fever, night sweats or weight changes.  Cardiovascular: negative for chest pain, dyspnea on exertion, edema, orthopnea, palpitations, paroxysmal nocturnal dyspnea or shortness of breath Dermatological: negative for rash Respiratory: negative for cough or wheezing Urologic: negative for hematuria Abdominal: negative for nausea, vomiting, diarrhea, bright red blood per rectum, melena, or hematemesis Neurologic: negative for visual changes, syncope, or dizziness All other systems reviewed and are otherwise negative except as noted above.    Blood pressure (!) 144/68, pulse (!) 59, temperature (!) 96.8 F (36 C), height 6\' 1"  (1.854 m), weight 186 lb (84.4 kg).  General appearance: alert and no distress Neck: no adenopathy, no carotid bruit, no JVD, supple, symmetrical, trachea midline and thyroid not enlarged, symmetric, no tenderness/mass/nodules Lungs: clear to auscultation bilaterally Heart: 2/6 stock ejection murmur at the base consistent with aortic stenosis with mild AI as well Extremities: extremities normal, atraumatic, no cyanosis or edema Pulses: 2+ and symmetric Skin: Skin color, texture, turgor normal. No rashes or lesions Neurologic: Alert and oriented X 3, normal strength and tone. Normal symmetric reflexes. Normal coordination and gait  EKG not performed today  ASSESSMENT AND PLAN:   Dyslipidemia History of hyperlipidemia not on statin therapy with lipid profile performed 09/03/2017 revealing total cholesterol of 160, LDL 103 and HDL 44.  Aortic insufficiency History of mild to moderate aortic insufficiency which we have been following by 2D echo most recently 10/22/2018.  We will repeat this in 1 year  Thoracic aortic  aneurysm [facility]) History of thoracic aortic aneurysm measuring 48 to 50 mm by either CTA or 2D echo followed by Dr. Arvid Right .  Bicuspid aortic valve History of bicuspid aortic valve with mild stenosis although his most recent echo showed a tricuspid valve.  I am unclear about the disparity  Paroxysmal atrial fibrillation ([facility]) History of PAF maintaining sinus rhythm on Eliquis oral anticoagulation.  The frequency and severity of his A. fib episodes have dramatically improved with the cessation of alcohol and caffeine.  He wishes not to pursue outpatient sleep study.  He did present for cardioversion back in August (08/18/2018 but was in sinus rhythm at the time was discharged home.      Lorretta Harp MD FACP,FACC,FAHA, [facility] 11/03/2018 10:48 AM

## 2018-11-03 NOTE — Assessment & Plan Note (Signed)
History of hyperlipidemia not on statin therapy with lipid profile performed 09/03/2017 revealing total cholesterol of 160, LDL 103 and HDL 44.

## 2018-11-03 NOTE — Assessment & Plan Note (Signed)
History of PAF maintaining sinus rhythm on Eliquis oral anticoagulation.  The frequency and severity of his A. fib episodes have dramatically improved with the cessation of alcohol and caffeine.  He wishes not to pursue outpatient sleep study.  He did present for cardioversion back in August (08/18/2018 but was in sinus rhythm at the time was discharged home.

## 2018-11-03 NOTE — Addendum Note (Signed)
Addended by: Annita Brod on: 11/03/2018 10:56 AM   Modules accepted: Orders

## 2018-11-03 NOTE — Assessment & Plan Note (Addendum)
History of thoracic aortic aneurysm measuring 48 to 50 mm by either CTA or 2D echo followed by Dr. Arvid Right .

## 2018-11-03 NOTE — Assessment & Plan Note (Signed)
History of mild to moderate aortic insufficiency which we have been following by 2D echo most recently 10/22/2018.  We will repeat this in 1 year

## 2018-11-16 ENCOUNTER — Encounter (HOSPITAL_BASED_OUTPATIENT_CLINIC_OR_DEPARTMENT_OTHER): Payer: PPO | Admitting: Cardiovascular Disease

## 2018-12-30 ENCOUNTER — Other Ambulatory Visit: Payer: Self-pay | Admitting: *Deleted

## 2018-12-30 DIAGNOSIS — I712 Thoracic aortic aneurysm, without rupture, unspecified: Secondary | ICD-10-CM

## 2019-01-01 ENCOUNTER — Encounter: Payer: Self-pay | Admitting: Surgery

## 2019-01-28 ENCOUNTER — Other Ambulatory Visit: Payer: Self-pay | Admitting: *Deleted

## 2019-01-28 DIAGNOSIS — I712 Thoracic aortic aneurysm, without rupture, unspecified: Secondary | ICD-10-CM

## 2019-01-28 NOTE — Progress Notes (Unsigned)
ct 

## 2019-02-01 ENCOUNTER — Encounter: Payer: Self-pay | Admitting: Surgery

## 2019-02-02 DIAGNOSIS — I712 Thoracic aortic aneurysm, without rupture: Secondary | ICD-10-CM | POA: Diagnosis not present

## 2019-02-02 LAB — CREATININE, SERUM: Creat: 0.84 mg/dL (ref 0.70–1.18)

## 2019-02-02 LAB — BUN: BUN: 15 mg/dL (ref 7–25)

## 2019-02-03 ENCOUNTER — Encounter: Payer: Self-pay | Admitting: Surgery

## 2019-02-03 ENCOUNTER — Other Ambulatory Visit: Payer: Self-pay

## 2019-02-03 ENCOUNTER — Ambulatory Visit
Admission: RE | Admit: 2019-02-03 | Discharge: 2019-02-03 | Disposition: A | Discharge status: Home / Self Care | Payer: PPO | Source: Ambulatory Visit | Attending: Surgery | Admitting: Surgery

## 2019-02-03 ENCOUNTER — Ambulatory Visit: Payer: PPO | Admitting: Surgery

## 2019-02-03 VITALS — BP 154/79 | HR 59 | Temp 97.7°F | Resp 16 | Ht 72.0 in | Wt 182.0 lb

## 2019-02-03 DIAGNOSIS — I712 Thoracic aortic aneurysm, without rupture, unspecified: Secondary | ICD-10-CM

## 2019-02-03 MED ORDER — IOPAMIDOL (ISOVUE-370) INJECTION 76%
75.0000 mL | Freq: Once | INTRAVENOUS | Status: AC | PRN
  Administered 2019-02-03: 75 mL via INTRAVENOUS

## 2019-02-03 NOTE — Progress Notes (Signed)
HPI:  This 74 year old gentleman has a bicuspid aortic valve as well as a 4.9 cm aortic root and 4.8 cm fusiform ascending aortic aneurysm which has been stable on CT scan.  He had a follow-up echocardiogram on 10/22/2018 by Dr. Gwenlyn Found which showed no change in mild aortic stenosis with a mean gradient of 19 mmHg and mild to moderate aortic insufficiency.  Left ventricular systolic function was normal and left ventricular internal dimensions were unchanged.  The aortic root was measured at 50 mm and the ascending aorta at 4.6 cm.  He continues to feel well without any chest or back pain.  He has had no shortness of breath or fatigue.  He denies any dizziness or syncope.  He said no peripheral edema.  He stays active walking and lifting light weights.  Current Outpatient Medications  Medication Sig Dispense Refill  . Cholecalciferol (HM VITAMIN D3) 4000 units CAPS Take 4,000 Units by mouth daily.    . Cyanocobalamin (VITAMIN B-12 CR) 1500 MCG TBCR Take 1 tablet by mouth daily.    Marland Kitchen ELIQUIS 5 MG TABS tablet TAKE 1 TABLET BY MOUTH TWICE A DAY 60 tablet 6  . GLUCOSAMINE HCL PO Take 2,000 mg by mouth daily.    Marland Kitchen lisinopril (ZESTRIL) 5 MG tablet Take 5 mg by mouth daily.    . metoprolol tartrate (LOPRESSOR) 25 MG tablet Take 0.5 tablets (12.5 mg total) by mouth every 6 (six) hours as needed (palpitations). 30 tablet 1  . Omega-3 Fatty Acids (FISH OIL) 1000 MG CAPS Take 1,000 mg by mouth daily.      No current facility-administered medications for this visit.     Physical Exam: BP (!) 154/79 (BP Location: Right Arm, Patient Position: Sitting, Cuff Size: Normal)   Pulse (!) 59   Temp 97.7 F (36.5 C)   Resp 16   Ht 6' (1.829 m)   Wt 182 lb (82.6 kg)   SpO2 98% Comment: RA  BMI 24.68 kg/m  He looks well. Cardiac exam shows a regular rate and rhythm with a 2/6 systolic murmur along the right sternal border.  There is no diastolic murmur. Lungs are clear. There is no peripheral  edema  Diagnostic Tests:  Study Result  Result status: Final result    ECHOCARDIOGRAM REPORT       Patient Name:   Jerry Velazquez Date of Exam: 10/22/2018 Medical Rec #:  [id number]     Height:       73.0 in Accession #:    [id number]    Weight:       190.8 lb Date of Birth:  30-Apr-1945     BSA:          2.11 m Patient Age:    20 years      BP:           144/64 mmHg Patient Gender: M             HR:           58 bpm. Exam Location:  [facility]  Procedure: 2D Echo, Cardiac Doppler and Color Doppler  Indications:    I35.9 Aortic Valve Disorder   History:        Patient has prior history of Echocardiogram examinations, most                 recent 09/09/2017. PVC's. RBBB. Aortic Aneurysm.   Sonographer:    Cresenciano Lick RDCS Referring Phys: [place]  IMPRESSIONS    1. Left ventricular ejection fraction, by visual estimation, is 60 to 65%. The left ventricle has normal function. Normal left ventricular size. There is mildly increased left ventricular hypertrophy. Normal wall motion. There was chordal SAM but no LV  outflow tract gradient noted.  2. Left ventricular diastolic Doppler parameters are consistent with impaired relaxation pattern of LV diastolic filling.  3. There is severe dilatation of the aortic root measuring 50 mm. Ascending aorta 46 mm.  4. The aortic valve is tricuspid Aortic valve regurgitation is mild to moderate by color flow Doppler. Mild aortic valve stenosis. Mean gradient 19 mmHg with AVA 2.1 cm^2.  5. Global right ventricle has normal systolic function.The right ventricular size is normal. No increase in right ventricular wall thickness.  6. The mitral valve is normal in structure. Trace mitral valve regurgitation. No evidence of mitral stenosis. Chordal but not valvular systolic anterior motion noted.  7. The tricuspid valve is normal in structure. Tricuspid valve regurgitation is trivial.  8. Left atrial size was normal.  9.  Right atrial size was normal. 10. The inferior vena cava is normal in size with greater than 50% respiratory variability, suggesting right atrial pressure of 3 mmHg. 11. The tricuspid regurgitant velocity is 2.32 m/s, and with an assumed right atrial pressure of 3 mmHg, the estimated right ventricular systolic pressure is normal at 24.5 mmHg.  FINDINGS  Left Ventricle: Left ventricular ejection fraction, by visual estimation, is 60 to 65%. The left ventricle has normal function. No evidence of left ventricular regional wall motion abnormalities. There is mildly increased left ventricular hypertrophy.  Normal left ventricular size. Spectral Doppler shows Left ventricular diastolic Doppler parameters are consistent with impaired relaxation pattern of LV diastolic filling.  Right Ventricle: The right ventricular size is normal. No increase in right ventricular wall thickness. Global RV systolic function is has normal systolic function. The tricuspid regurgitant velocity is 2.32 m/s, and with an assumed right atrial pressure  of 3 mmHg, the estimated right ventricular systolic pressure is normal at 24.5 mmHg.  Left Atrium: Left atrial size was normal in size.  Right Atrium: Right atrial size was normal in size  Pericardium: There is no evidence of pericardial effusion.  Mitral Valve: The mitral valve is normal in structure. No evidence of mitral valve stenosis by observation. Trace mitral valve regurgitation.  Tricuspid Valve: The tricuspid valve is normal in structure. Tricuspid valve regurgitation is trivial by color flow Doppler.  Aortic Valve: The aortic valve is tricuspid. Aortic valve regurgitation is mild to moderate by color flow Doppler. Mild aortic stenosis is present. Aortic valve mean gradient measures 19.0 mmHg. Aortic valve peak gradient measures 20.5 mmHg.  Pulmonic Valve: The pulmonic valve was normal in structure. Pulmonic valve regurgitation is trivial by color flow  Doppler.  Aorta: Aortic dilatation noted. There is severe dilatation of the aortic root measuring 50 mm.  Venous: The inferior vena cava is normal in size with greater than 50% respiratory variability, suggesting right atrial pressure of 3 mmHg.  IAS/Shunts: No atrial level shunt detected by color flow Doppler.     LEFT VENTRICLE PLAX 2D LVIDd:         4.90 cm Diastology LVIDs:         3.20 cm LV e' lateral:   8.38 cm/s LV PW:         1.00 cm LV E/e' lateral: 5.5 LV IVS:        1.00 cm LV e' medial:  6.53 cm/s LV SV:         72 ml   LV E/e' medial:  7.1 LV SV Index:   33.93    RIGHT VENTRICLE RV Basal diam:  4.00 cm RV S prime:     10.90 cm/s TAPSE (M-mode): 1.9 cm  LEFT ATRIUM             Index       RIGHT ATRIUM           Index LA diam:        4.50 cm 2.13 cm/m  RA Area:     16.00 cm LA Vol (A2C):   54.9 ml 26.03 ml/m RA Volume:   47.20 ml  22.38 ml/m LA Vol (A4C):   36.5 ml 17.31 ml/m LA Biplane Vol: 45.3 ml 21.48 ml/m  AORTIC VALVE AV Vmax:           226.20 cm/s AV Vmean:          170.600 cm/s AV VTI:            0.561 m AV Peak Grad:      20.5 mmHg AV Mean Grad:      19.0 mmHg LVOT Vmax:         89.55 cm/s LVOT Vmean:        65.050 cm/s LVOT VTI:          0.242 m LVOT/AV VTI ratio: 0.43   AORTA Ao Root diam: 4.95 cm Ao Asc diam:  4.60 cm  MITRAL VALVE                        TRICUSPID VALVE MV Area (PHT): 2.54 cm             TR Peak grad:   21.5 mmHg MV PHT:        86.71 msec           TR Vmax:        232.00 cm/s MV Decel Time: 299 msec MV E velocity: 46.30 cm/s 103 cm/s  SHUNTS MV A velocity: 60.70 cm/s 70.3 cm/s Systemic VTI: 0.24 m MV E/A ratio:  0.76       1.5    Loralie Champagne MD Electronically signed by Loralie Champagne MD Signature Date/Time: 10/22/2018/3:49:11 PM      CLINICAL DATA:  Thoracic aortic aneurysm.  EXAM: CT ANGIOGRAPHY CHEST WITH CONTRAST  TECHNIQUE: Multidetector CT imaging of the chest was performed using  the standard protocol during bolus administration of intravenous contrast. Multiplanar CT image reconstructions and MIPs were obtained to evaluate the vascular anatomy.  CONTRAST:  20mL ISOVUE-370 IOPAMIDOL (ISOVUE-370) INJECTION 76%  COMPARISON:  July 27, 2018.  FINDINGS: Cardiovascular: There is no evidence of thoracic aortic dissection. Grossly stable 4.8 cm ascending thoracic aortic aneurysm is noted. Transverse aortic arch measures 3.0 cm. Proximal descending thoracic aorta measures 3.1 cm. Normal cardiac size. No pericardial effusion.  Mediastinum/Nodes: No enlarged mediastinal, hilar, or axillary lymph nodes. Thyroid gland, trachea, and esophagus demonstrate no significant findings.  Lungs/Pleura: Lungs are clear. No pleural effusion or pneumothorax.  Upper Abdomen: No acute abnormality.  Musculoskeletal: No chest wall abnormality. No acute or significant osseous findings.  Review of the MIP images confirms the above findings.  IMPRESSION: Grossly stable 4.8 cm ascending thoracic aortic aneurysm. Recommend semi-annual imaging followup by CTA or MRA and referral to cardiothoracic surgery if not already obtained. This recommendation follows 2010 ACCF/AHA/AATS/ACR/ASA/SCA/SCAI/SIR/STS/SVM Guidelines for the Diagnosis and Management  of Patients With Thoracic Aortic Disease. Circulation. 2010; 121ML:4928372. Aortic aneurysm NOS (ICD10-I71.9)   Electronically Signed   By: Marijo Conception M.D.   On: 02/03/2019 15:14  Impression:  This 74 year old gentleman has a stable aortic root and ascending aortic aneurysm with a maximum diameter of 4.9 cm at the sinus level and 4.8 cm in the mid ascending aorta.  This is unchanged from his prior studies dating back to 10/31/2017.  He has a probable bicuspid aortic valve that is heavily calcified but only has mild aortic stenosis and mild to moderate aortic insufficiency.  He is asymptomatic with normal left ventricular  function and internal dimensions.  I do not think there is any indication for surgical treatment at this time but he should have close follow-up with a repeat CTA of the chest in 6 months and yearly echocardiogram.  I reviewed the CT images with the patient and his wife and answered their questions.  I stressed the importance of continued good blood pressure control and preventing further enlargement and acute aortic dissection.  I reviewed the signs and symptoms of acute aortic dissection with them.  I explained to them that at the current size his operative risk is higher than the risk of aortic dissection.  Plan:  I will see him back in 6 months with a CTA of the chest.  He will continue to follow-up with Dr. Quay Burow for his cardiology care and repeat echocardiogram.  I spent 20 minutes performing this established patient evaluation and > 50% of this time was spent face to face counseling and coordinating the care of this patient's bicuspid aortic valve with aortic stenosis/insufficiency and aortic aneurysm.    Gaye Pollack, MD [facility] [phone]

## 2019-02-23 ENCOUNTER — Other Ambulatory Visit: Payer: Self-pay | Admitting: Cardiovascular Disease

## 2019-03-05 ENCOUNTER — Other Ambulatory Visit: Payer: Self-pay

## 2019-03-05 MED ORDER — LISINOPRIL 5 MG PO TABS: 5.0000 mg | ORAL_TABLET | Freq: Every day | ORAL | 2 refills | Status: DC

## 2019-04-13 DIAGNOSIS — L821 Other seborrheic keratosis: Secondary | ICD-10-CM | POA: Diagnosis not present

## 2019-04-13 DIAGNOSIS — L578 Other skin changes due to chronic exposure to nonionizing radiation: Secondary | ICD-10-CM | POA: Diagnosis not present

## 2019-04-13 DIAGNOSIS — L570 Actinic keratosis: Secondary | ICD-10-CM | POA: Diagnosis not present

## 2019-04-13 DIAGNOSIS — L814 Other melanin hyperpigmentation: Secondary | ICD-10-CM | POA: Diagnosis not present

## 2019-04-13 DIAGNOSIS — L820 Inflamed seborrheic keratosis: Secondary | ICD-10-CM | POA: Diagnosis not present

## 2019-04-13 DIAGNOSIS — R208 Other disturbances of skin sensation: Secondary | ICD-10-CM | POA: Diagnosis not present

## 2019-04-13 DIAGNOSIS — D225 Melanocytic nevi of trunk: Secondary | ICD-10-CM | POA: Diagnosis not present

## 2019-04-13 DIAGNOSIS — D1801 Hemangioma of skin and subcutaneous tissue: Secondary | ICD-10-CM | POA: Diagnosis not present

## 2019-04-21 ENCOUNTER — Other Ambulatory Visit (HOSPITAL_COMMUNITY): Payer: Self-pay | Admitting: Nurse Practitioner

## 2019-05-18 ENCOUNTER — Other Ambulatory Visit (HOSPITAL_COMMUNITY): Payer: Self-pay | Admitting: Nurse Practitioner

## 2019-05-27 DIAGNOSIS — L578 Other skin changes due to chronic exposure to nonionizing radiation: Secondary | ICD-10-CM | POA: Diagnosis not present

## 2019-05-27 DIAGNOSIS — L820 Inflamed seborrheic keratosis: Secondary | ICD-10-CM | POA: Diagnosis not present

## 2019-07-01 ENCOUNTER — Other Ambulatory Visit: Payer: Self-pay | Admitting: Surgery

## 2019-07-01 DIAGNOSIS — I712 Thoracic aortic aneurysm, without rupture, unspecified: Secondary | ICD-10-CM

## 2019-08-11 ENCOUNTER — Other Ambulatory Visit: Payer: Self-pay

## 2019-08-11 ENCOUNTER — Ambulatory Visit: Payer: PPO | Admitting: Surgery

## 2019-08-11 ENCOUNTER — Ambulatory Visit
Admission: RE | Admit: 2019-08-11 | Discharge: 2019-08-11 | Disposition: A | Discharge status: Home / Self Care | Payer: PPO | Source: Ambulatory Visit | Attending: Surgery | Admitting: Surgery

## 2019-08-11 ENCOUNTER — Encounter: Payer: Self-pay | Admitting: Surgery

## 2019-08-11 VITALS — BP 150/82 | HR 54 | Temp 97.3°F | Resp 16 | Ht 73.0 in | Wt 185.0 lb

## 2019-08-11 DIAGNOSIS — I2510 Atherosclerotic heart disease of native coronary artery without angina pectoris: Secondary | ICD-10-CM | POA: Diagnosis not present

## 2019-08-11 DIAGNOSIS — I358 Other nonrheumatic aortic valve disorders: Secondary | ICD-10-CM | POA: Diagnosis not present

## 2019-08-11 DIAGNOSIS — I712 Thoracic aortic aneurysm, without rupture, unspecified: Secondary | ICD-10-CM

## 2019-08-11 DIAGNOSIS — I700 Atherosclerosis of aorta: Secondary | ICD-10-CM | POA: Diagnosis not present

## 2019-08-11 MED ORDER — IOPAMIDOL (ISOVUE-370) INJECTION 76%
75.0000 mL | Freq: Once | INTRAVENOUS | Status: AC | PRN
  Administered 2019-08-11: 75 mL via INTRAVENOUS

## 2019-08-11 NOTE — Progress Notes (Signed)
HPI:  This 74 year old gentleman has a bicuspid aortic valve as well as a 4.9 cm aortic root and 4.8 cm fusiform ascending aortic aneurysm which has been stable on CT scan.  He had a follow-up echocardiogram on 10/22/2018 by Dr. Gwenlyn Found which showed no change in mild aortic stenosis with a mean gradient of 19 mmHg and mild to moderate aortic insufficiency.  Left ventricular systolic function was normal and left ventricular internal dimensions were unchanged.  The aortic root was measured at 50 mm and the ascending aorta at 4.6 cm.  I last saw him on 02/03/2019 at which time CTA of the chest showed a stable 4.8 cm ascending aortic aneurysm.  He has remained physically active and felt well but does report over the past few weeks that he has has noticed some decrease in his stamina which he feels may be due to the heat.  He has had no shortness of breath or chest pain.  He denies any peripheral edema.  Current Outpatient Medications  Medication Sig Dispense Refill  . Cholecalciferol (HM VITAMIN D3) 4000 units CAPS Take 4,000 Units by mouth daily.    . Cyanocobalamin (VITAMIN B-12 CR) 1500 MCG TBCR Take 1 tablet by mouth daily.    Marland Kitchen ELIQUIS 5 MG TABS tablet TAKE 1 TABLET BY MOUTH 2 TIMES DAILY. APPOINTMENT REQUIRED FOR FURTHER REFILLS [phone] 180 tablet 1  . GLUCOSAMINE HCL PO Take 2,000 mg by mouth daily.    Marland Kitchen lisinopril (ZESTRIL) 5 MG tablet Take 1 tablet (5 mg total) by mouth daily. 90 tablet 2  . Omega-3 Fatty Acids (FISH OIL) 1000 MG CAPS Take 1,000 mg by mouth daily.     . metoprolol tartrate (LOPRESSOR) 25 MG tablet Take 0.5 tablets (12.5 mg total) by mouth every 6 (six) hours as needed (palpitations). 30 tablet 1   No current facility-administered medications for this visit.     Physical Exam: BP (!) 150/82 (BP Location: Right Arm, Patient Position: Sitting, Cuff Size: Normal)   Pulse 54   Temp (!) 97.3 F (36.3 C) (Temporal)   Resp 16   Ht 6\' 1"  (1.854 m)   Wt 185 lb (83.9 kg)    SpO2 99% Comment: RA  BMI 24.41 kg/m  He looks well. Cardiac exam shows a regular rate and rhythm with a 2/6 systolic murmur along the right sternal border.  There is no diastolic murmur. Lungs are clear. There is no peripheral edema.  Diagnostic Tests:  CLINICAL DATA:  Thoracic aortic aneurysm, bicuspid aortic valve  EXAM: CT ANGIOGRAPHY CHEST WITH CONTRAST  TECHNIQUE: Multidetector CT imaging of the chest was performed using the standard protocol during bolus administration of intravenous contrast. Multiplanar CT image reconstructions and MIPs were obtained to evaluate the vascular anatomy.  CONTRAST:  38mL ISOVUE-370 IOPAMIDOL (ISOVUE-370) INJECTION 76%  COMPARISON:  02/03/2019  FINDINGS: Cardiovascular: Stable fusiform aneurysmal dilatation of the ascending thoracic aorta, maximal diameter 4.9 cm, previously 4.8 cm.  Transverse aortic arch 2.8 cm, previously 3.0 cm.  Proximal descending thoracic aorta 2.9 cm, previously 3.1 cm.  Stable atherosclerotic changes. Calcifications noted of the aortic valve. Native coronary calcifications as well. No acute vascular finding, dissection, focal wall thickening, mediastinal hemorrhage or hematoma. Patent 3 vessel arch anatomy with tortuosity noted.  Normal heart size.  No pericardial effusion.  Mediastinum/Nodes: No enlarged mediastinal, hilar, or axillary lymph nodes. Thyroid gland, trachea, and esophagus demonstrate no significant findings.  Lungs/Pleura: Stable punctate subcentimeter calcified granuloma in the right upper lobe  posteriorly, image 55 series 7. No focal pneumonia, collapse or consolidation. Negative for edema or interstitial process. Minimal dependent basilar hypoventilatory changes. No new or enlarging nodule.  Trachea central airways are patent.  No pleural abnormality, effusion, or pneumothorax.  Upper Abdomen: Stable bilateral hepatic hypodensities, suspect hepatic cysts. No acute  upper abdominal finding. Celiac, SMA and bilateral renal arteries all remain patent.  Musculoskeletal: Degenerative changes noted of the spine with associated scoliosis. No acute osseous finding or fracture.  Review of the MIP images confirms the above findings.  IMPRESSION: Stable 4.9 cm ascending thoracic aortic aneurysm.  Ascending thoracic aortic aneurysm. Recommend semi-annual imaging followup by CTA or MRA and referral to cardiothoracic surgery if not already obtained. This recommendation follows 2010 ACCF/AHA/AATS/ACR/ASA/SCA/SCAI/SIR/STS/SVM Guidelines for the Diagnosis and Management of Patients With Thoracic Aortic Disease. Circulation. 2010; 121: H476-L465. Aortic aneurysm NOS (ICD10-I71.9)  No other acute intrathoracic vascular abnormality or dissection.  No other acute intrathoracic abnormality or finding.  Aortic Atherosclerosis (ICD10-I70.0).   Electronically Signed   By: Jerilynn Mages.  Shick M.D.   On: 08/11/2019 13:31   Impression:  This 74 year old gentleman has a probable bicuspid aortic valve with a stable 4.9 cm ascending aortic aneurysm.  His aorta is just below the usual surgical threshold of 5.0 cm in patients with bicuspid aortic valve and has been stable since it was diagnosed in 2019.  He does have significant aortic valve calcification with a mean gradient of 19 mmHg by echocardiogram in 10/2018 consistent with mild to moderate aortic stenosis.  There was also mild to moderate aortic insufficiency.  There was no indication for aortic valve replacement based on his echocardiogram in 10/2018 but he has a repeat study next month.  He has remained active and asymptomatic until he recently began having some fatigue.  He feels like this is related to the heat but asked him to follow his symptoms closely.  I have recommended continued observation for now with a repeat CTA of the chest in 6 months and follow-up of his echocardiogram next month.  Plan:  He will  have a follow-up echocardiogram next month and I will see him back in 6 months with a CTA of the chest.  He will continue to watch his symptoms closely and report any worsening exertional fatigue or shortness of breath.  I spent 20 minutes performing this established patient evaluation and > 50% of this time was spent face to face counseling and coordinating the care of this patient's bicuspid aortic valve and aortic aneurysm.    Gaye Pollack, MD [facility] [phone]

## 2019-08-23 ENCOUNTER — Ambulatory Visit (HOSPITAL_COMMUNITY): Payer: PPO | Attending: Cardiology

## 2019-08-23 ENCOUNTER — Other Ambulatory Visit: Payer: Self-pay

## 2019-08-23 DIAGNOSIS — I350 Nonrheumatic aortic (valve) stenosis: Secondary | ICD-10-CM | POA: Diagnosis not present

## 2019-08-23 LAB — ECHOCARDIOGRAM COMPLETE
AR max vel: 1.2 cm2
AV Area VTI: 1.36 cm2
AV Area mean vel: 1.32 cm2
AV Mean grad: 16 mmHg
AV Peak grad: 28.7 mmHg
Ao pk vel: 2.68 m/s
Area-P 1/2: 1.9 cm2
P 1/2 time: 606 msec
S' Lateral: 3.3 cm

## 2019-08-24 ENCOUNTER — Other Ambulatory Visit: Payer: Self-pay

## 2019-08-24 DIAGNOSIS — Q231 Congenital insufficiency of aortic valve: Secondary | ICD-10-CM

## 2019-08-24 DIAGNOSIS — I351 Nonrheumatic aortic (valve) insufficiency: Secondary | ICD-10-CM

## 2019-08-24 DIAGNOSIS — I350 Nonrheumatic aortic (valve) stenosis: Secondary | ICD-10-CM

## 2019-08-24 NOTE — Progress Notes (Signed)
Echo

## 2019-08-31 ENCOUNTER — Ambulatory Visit: Payer: PPO | Admitting: Cardiovascular Disease

## 2019-08-31 ENCOUNTER — Other Ambulatory Visit: Payer: Self-pay

## 2019-08-31 ENCOUNTER — Encounter: Payer: Self-pay | Admitting: Cardiovascular Disease

## 2019-08-31 DIAGNOSIS — E785 Hyperlipidemia, unspecified: Secondary | ICD-10-CM | POA: Diagnosis not present

## 2019-08-31 DIAGNOSIS — I712 Thoracic aortic aneurysm, without rupture, unspecified: Secondary | ICD-10-CM

## 2019-08-31 DIAGNOSIS — I10 Essential (primary) hypertension: Secondary | ICD-10-CM

## 2019-08-31 DIAGNOSIS — I480 Paroxysmal atrial fibrillation: Secondary | ICD-10-CM | POA: Diagnosis not present

## 2019-08-31 DIAGNOSIS — I351 Nonrheumatic aortic (valve) insufficiency: Secondary | ICD-10-CM | POA: Diagnosis not present

## 2019-08-31 NOTE — Progress Notes (Signed)
08/31/2019 Jerry Velazquez   13-Oct-1945  [id number]  Primary Physician Alycia Rossetti, MD Primary Cardiologist: Lorretta Harp MD Lupe Carney, [place]  HPI:  Jerry Velazquez is a 74 y.o.    thin appearing married Caucasian male father of no children who is recently retired Nurse, mental health. He was referred by Gerhard Perches for cardiovascular evaluation because of aortic insufficiency.I last  saw him in the office 11/03/2018. He has no cardiac risk factors. He was seen because of palpitations with a recent Holter monitor that showed PVCs and short atrial runs. 2D echo performed because of his soft murmur revealed a bicuspid aortic valve with mild stenosis and mild to moderate aortic insufficiency with normal LV size and function. There was noted mild dilatation of the thoracic aorta. He alsowasexperiences a quivering sensation in his chest. He has deniedchest pain or shortness of breathin the past.  I referred him to Dr. [name] and Dr. [name] as well. Ultimately he was begun on Eliquis oral anticoagulation. A chest CTA was performed 10/31/2017 revealing a 4.9 cm ascending thoracic aortic aneurysm. This will be followed on an semiannual basis. He will ultimately need thoracic aortic repair as well as AVR. It is possible that he would also benefit from a surgical maze procedure during the operation and removal of his left atrial appendage but I will leave this to Dr. [name] to decide.  Since I  saw him a year ago he continues to do well.  Is completely asymptomatic.  He just does not think he has had any episodes of breakthrough A. fib but remains on Eliquis oral anticoagulation.  He has seen Dr. [name] last month for follow-up of his thoracic aortic aneurysm.  Echo performed 08/23/2019 revealed normal LV systolic function, mild MR, mild AI and left ear with a thoracic aorta measuring 49 mm, not at threshold for consideration of thoracic aortic  replacement.   Current Meds  Medication Sig  . Cholecalciferol (HM VITAMIN D3) 4000 units CAPS Take 4,000 Units by mouth daily.  . Cyanocobalamin (VITAMIN B-12 CR) 1500 MCG TBCR Take 1 tablet by mouth daily.  Marland Kitchen ELIQUIS 5 MG TABS tablet TAKE 1 TABLET BY MOUTH 2 TIMES DAILY. APPOINTMENT REQUIRED FOR FURTHER REFILLS [phone]  . GLUCOSAMINE HCL PO Take 2,000 mg by mouth daily.  Marland Kitchen lisinopril (ZESTRIL) 5 MG tablet Take 1 tablet (5 mg total) by mouth daily.  . metoprolol tartrate (LOPRESSOR) 25 MG tablet Take 0.5 tablets (12.5 mg total) by mouth every 6 (six) hours as needed (palpitations).  . Omega-3 Fatty Acids (FISH OIL) 1000 MG CAPS Take 1,000 mg by mouth daily.      No Known Allergies  Social History   Socioeconomic History  . Marital status: Married    Spouse name: Not on file  . Number of children: Not on file  . Years of education: Not on file  . Highest education level: Not on file  Occupational History  . 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 Smoking status: Former Smoker    Years: 18.00    Types: Pipe  . Smokeless tobacco: Never Used  . Tobacco comment: smoked a pipe  Vaping Use  . Vaping Use: Never used  Substance and Sexual Activity  . Alcohol use: Not Currently  . Drug use: Never  . Sexual activity: Yes  Other Topics Concern  . Not on file  Social History Narrative  Lives in [place]   Retired English as a second language teacher   Social Determinants of Health   Financial Resource Strain:   . Difficulty of Paying Living Expenses:   Food Insecurity:   . Worried About Charity fundraiser in the Last Year:   . Arboriculturist in the Last Year:   Transportation Needs:   . Film/video editor (Medical):   Marland Kitchen Lack of Transportation (Non-Medical):   Physical Activity:   . Days of Exercise per Week:   . Minutes of Exercise per Session:   Stress:   . Feeling of Stress :   Social Connections:   . Frequency of Communication with  Friends and Family:   . Frequency of Social Gatherings with Friends and Family:   . Attends Religious Services:   . Active Member of Clubs or Organizations:   . Attends Archivist Meetings:   Marland Kitchen Marital Status:   Intimate Partner Violence:   . Fear of Current or Ex-Partner:   . Emotionally Abused:   Marland Kitchen Physically Abused:   . Sexually Abused:      Review of Systems: General: negative for chills, fever, night sweats or weight changes.  Cardiovascular: negative for chest pain, dyspnea on exertion, edema, orthopnea, palpitations, paroxysmal nocturnal dyspnea or shortness of breath Dermatological: negative for rash Respiratory: negative for cough or wheezing Urologic: negative for hematuria Abdominal: negative for nausea, vomiting, diarrhea, bright red blood per rectum, melena, or hematemesis Neurologic: negative for visual changes, syncope, or dizziness All other systems reviewed and are otherwise negative except as noted above.    Blood pressure 134/80, pulse (!) 56, height 6\' 1"  (1.854 m), weight 190 lb 9.6 oz (86.5 kg), SpO2 97 %.  General appearance: alert and no distress Neck: no adenopathy, no carotid bruit, no JVD, supple, symmetrical, trachea midline and thyroid not enlarged, symmetric, no tenderness/mass/nodules Lungs: clear to auscultation bilaterally Heart: regular rate and rhythm, S1, S2 normal, no murmur, click, rub or gallop Extremities: extremities normal, atraumatic, no cyanosis or edema Pulses: 2+ and symmetric Skin: Skin color, texture, turgor normal. No rashes or lesions Neurologic: Alert and oriented X 3, normal strength and tone. Normal symmetric reflexes. Normal coordination and gait  EKG sinus bradycardia 56 with incomplete right bundle branch block.  I personally reviewed this EKG.  ASSESSMENT AND PLAN:   Dyslipidemia History of dyslipidemia not on statin therapy with lipid profile performed 09/03/2017 revealing a total cholesterol of 160, LDL 103  and HDL 44.  I am going to recheck a lipid liver profile.  Aortic insufficiency Mild aortic insufficiency on recent 2D echocardiogram performed 08/23/2019.  He also had mild MR and mild AS .  We will repeat a 2D echo in 1 year.  Thoracic aortic aneurysm (HCC) History of thoracic aortic aneurysm measuring 4.9 cm noted by recent 2D echocardiogram followed by Dr. [name].  It has not reached threshold of 50 mm yet.  Essential hypertension History of essential hypertension blood pressure measured today 134/80.  He is on lisinopril and metoprolol.  Paroxysmal atrial fibrillation (HCC) History of PAF maintaining sinus rhythm on Eliquis oral anticoagulation.      Lorretta Harp MD FACP,FACC,FAHA, [facility] 08/31/2019 9:55 AM

## 2019-08-31 NOTE — Assessment & Plan Note (Signed)
History of dyslipidemia not on statin therapy with lipid profile performed 09/03/2017 revealing a total cholesterol of 160, LDL 103 and HDL 44.  I am going to recheck a lipid liver profile.

## 2019-08-31 NOTE — Assessment & Plan Note (Signed)
History of essential hypertension blood pressure measured today 134/80.  He is on lisinopril and metoprolol.

## 2019-08-31 NOTE — Assessment & Plan Note (Signed)
History of thoracic aortic aneurysm measuring 4.9 cm noted by recent 2D echocardiogram followed by Dr. [name].  It has not reached threshold of 50 mm yet.

## 2019-08-31 NOTE — Assessment & Plan Note (Signed)
Mild aortic insufficiency on recent 2D echocardiogram performed 08/23/2019.  He also had mild MR and mild AS .  We will repeat a 2D echo in 1 year.

## 2019-08-31 NOTE — Patient Instructions (Signed)
Medication Instructions:  The current medical regimen is effective;  continue present plan and medications.  *If you need a refill on your cardiac medications before your next appointment, please call your pharmacy*   Lab Work: LIPID/LIVER (come back, fasting- nothing to eat or drink, no lab appointment needed)  If you have labs (blood work) drawn today and your tests are completely normal, you will receive your results only by: Marland Kitchen MyChart Message (if you have MyChart) OR . A paper copy in the mail If you have any lab test that is abnormal or we need to change your treatment, we will call you to review the results.   Testing/Procedures: Echocardiogram (12 months)- Your physician has requested that you have an echocardiogram. Echocardiography is a painless test that uses sound waves to create images of your heart. It provides your doctor with information about the size and shape of your heart and how well your heart's chambers and valves are working. This procedure takes approximately one hour. There are no restrictions for this procedure. This will be performed at our [facility] location - [address].    Follow-Up: At [facility], you and your health needs are our priority.  As part of our continuing mission to provide you with exceptional heart care, we have created designated Provider Care Teams.  These Care Teams include your primary Cardiologist (physician) and Advanced Practice Providers (APPs -  Physician Assistants and Nurse Practitioners) who all work together to provide you with the care you need, when you need it.  We recommend signing up for the patient portal called "MyChart".  Sign up information is provided on this After Visit Summary.  MyChart is used to connect with patients for Virtual Visits (Telemedicine).  Patients are able to view lab/test results, encounter notes, upcoming appointments, etc.  Non-urgent messages can be sent to your provider as well.    To learn more about what you can do with MyChart, go to [email or web address].    Your next appointment:   12 month(s) after ECHO.  The format for your next appointment:   In Person  Provider:   Quay Burow, MD

## 2019-08-31 NOTE — Assessment & Plan Note (Signed)
History of PAF maintaining sinus rhythm on Eliquis oral anticoagulation. 

## 2019-09-13 DIAGNOSIS — E785 Hyperlipidemia, unspecified: Secondary | ICD-10-CM | POA: Diagnosis not present

## 2019-09-13 LAB — LIPID PANEL
Chol/HDL Ratio: 3.2 ratio (ref 0.0–5.0)
Cholesterol, Total: 174 mg/dL (ref 100–199)
HDL: 55 mg/dL (ref >39)
LDL Chol Calc (NIH): 108 mg/dL — ABNORMAL HIGH (ref 0–99)
Triglycerides: 53 mg/dL (ref 0–149)
VLDL Cholesterol Cal: 11 mg/dL (ref 5–40)

## 2019-09-13 LAB — HEPATIC FUNCTION PANEL
ALT: 48 IU/L — ABNORMAL HIGH (ref 0–44)
AST: 39 IU/L (ref 0–40)
Albumin: 4 g/dL (ref 3.7–4.7)
Alkaline Phosphatase: 75 IU/L (ref 48–121)
Bilirubin Total: 0.5 mg/dL (ref 0.0–1.2)
Bilirubin, Direct: 0.13 mg/dL (ref 0.00–0.40)
Total Protein: 6.8 g/dL (ref 6.0–8.5)

## 2019-09-28 ENCOUNTER — Other Ambulatory Visit: Payer: Self-pay

## 2019-09-28 DIAGNOSIS — E785 Hyperlipidemia, unspecified: Secondary | ICD-10-CM

## 2019-09-28 MED ORDER — ATORVASTATIN CALCIUM 20 MG PO TABS
20.0000 mg | ORAL_TABLET | Freq: Every day | ORAL | 3 refills | Status: DC
Start: 2019-09-28 — End: 2020-10-09

## 2019-09-28 NOTE — Progress Notes (Signed)
lipitor

## 2019-09-29 ENCOUNTER — Telehealth: Payer: Self-pay | Admitting: Cardiovascular Disease

## 2019-09-29 NOTE — Telephone Encounter (Signed)
Returned call to patient, advised unsure who called or what this was in regards to.   Made patient aware of response on Mychart, patient aware and verbalized understanding.

## 2019-09-29 NOTE — Telephone Encounter (Signed)
Follow Up:     Pt is returning a call, he did not know who called.

## 2019-10-22 ENCOUNTER — Telehealth: Payer: Self-pay

## 2019-10-22 DIAGNOSIS — Z1211 Encounter for screening for malignant neoplasm of colon: Secondary | ICD-10-CM

## 2019-10-22 NOTE — Telephone Encounter (Signed)
Ok to place referral.

## 2019-10-22 NOTE — Telephone Encounter (Signed)
Pt was last seen by Kristeen Miss in Jan 2020 He needs to come establish care with someone Okay to send referral for screening colonoscopy

## 2019-10-22 NOTE — Telephone Encounter (Signed)
Referral orders placed.   Letter sent.

## 2019-10-22 NOTE — Telephone Encounter (Signed)
Pt would like to schedule a colonoscopy   Pt call back [phone]

## 2019-10-27 ENCOUNTER — Encounter (INDEPENDENT_AMBULATORY_CARE_PROVIDER_SITE_OTHER): Payer: Self-pay | Admitting: *Deleted

## 2019-11-13 DIAGNOSIS — Z20822 Contact with and (suspected) exposure to covid-19: Secondary | ICD-10-CM | POA: Diagnosis not present

## 2019-11-19 MED ORDER — APIXABAN 5 MG PO TABS: ORAL_TABLET | ORAL | 2 refills | Status: DC

## 2019-11-24 ENCOUNTER — Other Ambulatory Visit: Payer: Self-pay | Admitting: Cardiovascular Disease

## 2019-11-25 ENCOUNTER — Telehealth: Payer: Self-pay | Admitting: *Deleted

## 2019-11-25 ENCOUNTER — Other Ambulatory Visit: Payer: Self-pay

## 2019-11-25 ENCOUNTER — Ambulatory Visit (INDEPENDENT_AMBULATORY_CARE_PROVIDER_SITE_OTHER): Payer: PPO | Admitting: Family Medicine

## 2019-11-25 VITALS — BP 150/80 | HR 78 | Temp 97.9°F | Ht 73.0 in | Wt 182.0 lb

## 2019-11-25 DIAGNOSIS — I712 Thoracic aortic aneurysm, without rupture, unspecified: Secondary | ICD-10-CM

## 2019-11-25 DIAGNOSIS — I480 Paroxysmal atrial fibrillation: Secondary | ICD-10-CM

## 2019-11-25 DIAGNOSIS — Z0000 Encounter for general adult medical examination without abnormal findings: Secondary | ICD-10-CM

## 2019-11-25 DIAGNOSIS — Q231 Congenital insufficiency of aortic valve: Secondary | ICD-10-CM

## 2019-11-25 DIAGNOSIS — I10 Essential (primary) hypertension: Secondary | ICD-10-CM | POA: Diagnosis not present

## 2019-11-25 DIAGNOSIS — Z1211 Encounter for screening for malignant neoplasm of colon: Secondary | ICD-10-CM

## 2019-11-25 DIAGNOSIS — Z0001 Encounter for general adult medical examination with abnormal findings: Secondary | ICD-10-CM | POA: Diagnosis not present

## 2019-11-25 DIAGNOSIS — Z125 Encounter for screening for malignant neoplasm of prostate: Secondary | ICD-10-CM

## 2019-11-25 MED ORDER — AMOXICILLIN 875 MG PO TABS: 875.0000 mg | ORAL_TABLET | Freq: Two times a day (BID) | ORAL | 0 refills | Status: DC

## 2019-11-25 NOTE — Telephone Encounter (Signed)
Received verbal orders for Cologuard.   Order placed via [facility].   Cologuard (Order [id number])

## 2019-11-25 NOTE — Telephone Encounter (Signed)
-----   Message from Susy Frizzle, MD sent at 11/25/2019  9:58 AM EST ----- Schedule for cologuard

## 2019-11-25 NOTE — Progress Notes (Signed)
Subjective:    Patient ID: Jerry Velazquez, male    DOB: [birth year], 74 y.o.   MRN: [id number]  HPI Patient is a very pleasant 74 year old Caucasian male here today for complete physical exam.  Past medical history significant for a bicuspid aortic valve, and a sending aortic aneurysm that is 4.9 cm followed by his cardiologist Dr. Gwenlyn Found, paroxysmal atrial fibrillation.  He no longer smokes.  He no longer drinks.  He is avoiding caffeine.  He recently had RSV however symptoms have lasted 3 weeks and he now has pain and pressure in his frontal and maxillary sinuses with postnasal drip and coughing suggesting a secondary sinus infection.  Wife was diagnosed with RSV.  He is overdue for prostate cancer screening.  He is overdue for colonoscopy. Immunization History  Administered Date(s) Administered  . Fluad Quad(high Dose 65+) 09/15/2018  . Pneumococcal Polysaccharide-23 09/15/2018   He is due for a flu shot.  He is due for the shingles vaccine.  He is due for a booster on his Covid shot.  He is due for Prevnar 13. He denies any falls, depression, or memory loss. Past Medical History:  Diagnosis Date  . Aortic aneurysm ([place])   . Aortic insufficiency due to bicuspid aortic valve   . Aortic stenosis due to bicuspid aortic valve   . Atrial flutter ([place])   . Bicuspid aortic valve   . GERD (gastroesophageal reflux disease)    past hx- stopped alcohol and no more issues   . NSVT (nonsustained ventricular tachycardia) ([place])   . Paroxysmal atrial fibrillation ([facility])   . PVC's (premature ventricular contractions)   . RBBB    Past Surgical History:  Procedure Laterality Date  . COLONOSCOPY     12 yrs ago [facility]- normal per pt   . CYST EXCISION     left knee   . CYST EXCISION     tongue   Current Outpatient Medications on File Prior to Visit  Medication Sig Dispense Refill  . apixaban (ELIQUIS) 5 MG TABS tablet TAKE 1 TABLET BY MOUTH 2 TIMES DAILY. 180 tablet 2  . atorvastatin (LIPITOR) 20 MG  tablet Take 1 tablet (20 mg total) by mouth daily. 90 tablet 3  . Cholecalciferol (HM VITAMIN D3) 4000 units CAPS Take 4,000 Units by mouth daily.    . Cyanocobalamin (VITAMIN B-12 CR) 1500 MCG TBCR Take 1 tablet by mouth daily.    Marland Kitchen GLUCOSAMINE HCL PO Take 2,000 mg by mouth daily.    Marland Kitchen lisinopril (ZESTRIL) 5 MG tablet TAKE 1 TABLET BY MOUTH EVERY DAY 90 tablet 3  . Omega-3 Fatty Acids (FISH OIL) 1000 MG CAPS Take 1,000 mg by mouth daily.     . metoprolol tartrate (LOPRESSOR) 25 MG tablet Take 0.5 tablets (12.5 mg total) by mouth every 6 (six) hours as needed (palpitations). 30 tablet 1   No current facility-administered medications on file prior to visit.   No Known Allergies Social History   Socioeconomic History  . Marital status: Married    Spouse name: Not on file  . Number of children: Not on file  . Years of education: Not on file  . Highest education level: Not on file  Occupational History  . 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 Smoking status: Former Smoker    Years: 18.00    Types: Pipe  . Smokeless tobacco: Never Used  . Tobacco comment: smoked a pipe  Vaping Use  . Vaping Use: Never used  Substance and Sexual Activity  . Alcohol use: Not Currently  . Drug use: Never  . Sexual activity: Yes  Other Topics Concern  . Not on file  Social History Narrative   Lives in [place]   Retired English as a second language teacher   Social Determinants of Health   Financial Resource Strain:   . Difficulty of Paying Living Expenses: Not on file  Food Insecurity:   . Worried About Charity fundraiser in the Last Year: Not on file  . Ran Out of Food in the Last Year: Not on file  Transportation Needs:   . Lack of Transportation (Medical): Not on file  . Lack of Transportation (Non-Medical): Not on file  Physical Activity:   . Days of Exercise per Week: Not on file  . Minutes of Exercise per Session: Not on file  Stress:   . Feeling of Stress : Not  on file  Social Connections:   . Frequency of Communication with Friends and Family: Not on file  . Frequency of Social Gatherings with Friends and Family: Not on file  . Attends Religious Services: Not on file  . Active Member of Clubs or Organizations: Not on file  . Attends Archivist Meetings: Not on file  . Marital Status: Not on file  Intimate Partner Violence:   . Fear of Current or Ex-Partner: Not on file  . Emotionally Abused: Not on file  . Physically Abused: Not on file  . Sexually Abused: Not on file   Family History  Problem Relation Age of Onset  . Heart disease Mother   . Hypertension Father   . Stroke Father   . COPD Sister   . Colon polyps Sister   . Hypertension Brother   . COPD Brother   . Colon cancer Neg Hx   . Esophageal cancer Neg Hx   . Rectal cancer Neg Hx   . Stomach cancer Neg Hx       Review of Systems  All other systems reviewed and are negative.      Objective:   Physical Exam Vitals reviewed.  Constitutional:      General: He is not in acute distress.    Appearance: Normal appearance. He is normal weight. He is not ill-appearing, toxic-appearing or diaphoretic.  HENT:     Head: Normocephalic and atraumatic.     Right Ear: Tympanic membrane and ear canal normal. There is no impacted cerumen.     Left Ear: Tympanic membrane and ear canal normal. There is no impacted cerumen.     Nose: Congestion and rhinorrhea present.     Mouth/Throat:     Pharynx: No oropharyngeal exudate or posterior oropharyngeal erythema.  Eyes:     General: No scleral icterus.       Right eye: No discharge.        Left eye: No discharge.     Extraocular Movements: Extraocular movements intact.     Conjunctiva/sclera: Conjunctivae normal.     Pupils: Pupils are equal, round, and reactive to light.  Neck:     Vascular: No carotid bruit.  Cardiovascular:     Rate and Rhythm: Normal rate and regular rhythm.     Pulses: Normal pulses.     Heart  sounds: Murmur heard.  No friction rub. No gallop.   Pulmonary:     Effort: Pulmonary effort is normal. No respiratory distress.     Breath sounds: Normal breath sounds. No  stridor. No wheezing, rhonchi or rales.  Chest:     Chest wall: No tenderness.  Abdominal:     General: Abdomen is flat. Bowel sounds are normal. There is no distension.     Palpations: Abdomen is soft. There is no mass.     Tenderness: There is no abdominal tenderness. There is no guarding or rebound.     Hernia: No hernia is present.  Musculoskeletal:     Cervical back: Normal range of motion and neck supple.     Right lower leg: No edema.  Lymphadenopathy:     Cervical: No cervical adenopathy.  Skin:    General: Skin is warm.     Coloration: Skin is not jaundiced or pale.     Findings: No bruising, erythema, lesion or rash.  Neurological:     General: No focal deficit present.     Mental Status: He is alert and oriented to person, place, and time. Mental status is at baseline.     Cranial Nerves: No cranial nerve deficit.     Motor: No weakness.     Coordination: Coordination normal.     Gait: Gait normal.     Deep Tendon Reflexes: Reflexes normal.  Psychiatric:        Mood and Affect: Mood normal.        Behavior: Behavior normal.        Thought Content: Thought content normal.        Judgment: Judgment normal.           Assessment & Plan:  Colon cancer screening - Plan: Ambulatory referral to Gastroenterology  Prostate cancer screening - Plan: PSA  Benign essential HTN - Plan: CBC with Differential/Platelet, COMPLETE METABOLIC PANEL WITH GFR, Lipid panel  Paroxysmal atrial fibrillation ([facility])  Thoracic aortic aneurysm without rupture ([facility])  Bicuspid aortic valve  General medical exam  Patient denies any falls, depression, or memory loss.  I believe he most likely has developed a secondary sinus infection after an initial viral upper respiratory infection so I will treat him with  amoxicillin 875 mg twice daily for 10 days.  He is overdue for colonoscopy.  Therefore will order a colonoscopy for the patient however after discussing this he is uncertain if he wants to stop his anticoagulant to do a colonoscopy.  Therefore we also discussed Cologuard.  Afterwards he asked me to schedule him for the Cologuard.  I will check for prostate cancer with a PSA.  I will check a CMP, CBC, and fasting lipid panel.  His aneurysm is monitored by his cardiologist.  He does have a substantial murmur over the aortic valve but this is followed by his cardiologist.  He is due for Prevnar 13, booster on his Covid shot, shingles vaccine, and a flu shot but he defers all of these at the present time until he is feeling better.

## 2019-11-26 LAB — COMPLETE METABOLIC PANEL WITH GFR
AG Ratio: 1.2 (calc) (ref 1.0–2.5)
ALT: 23 U/L (ref 9–46)
AST: 20 U/L (ref 10–35)
Albumin: 3.8 g/dL (ref 3.6–5.1)
Alkaline phosphatase (APISO): 79 U/L (ref 35–144)
BUN: 12 mg/dL (ref 7–25)
CO2: 28 mmol/L (ref 20–32)
Calcium: 9.4 mg/dL (ref 8.6–10.3)
Chloride: 104 mmol/L (ref 98–110)
Creat: 0.73 mg/dL (ref 0.70–1.18)
GFR, Est African American: 105 mL/min/{1.73_m2} (ref >=60)
GFR, Est Non African American: 91 mL/min/{1.73_m2} (ref >=60)
Globulin: 3.3 g/dL (calc) (ref 1.9–3.7)
Glucose, Bld: 82 mg/dL (ref 65–99)
Potassium: 5.2 mmol/L (ref 3.5–5.3)
Sodium: 139 mmol/L (ref 135–146)
Total Bilirubin: 0.7 mg/dL (ref 0.2–1.2)
Total Protein: 7.1 g/dL (ref 6.1–8.1)

## 2019-11-26 LAB — CBC WITH DIFFERENTIAL/PLATELET
Absolute Monocytes: 728 cells/uL (ref 200–950)
Basophils Absolute: 39 cells/uL (ref 0–200)
Basophils Relative: 0.6 %
Eosinophils Absolute: 202 cells/uL (ref 15–500)
Eosinophils Relative: 3.1 %
HCT: 40 % (ref 38.5–50.0)
Hemoglobin: 13.2 g/dL (ref 13.2–17.1)
Lymphs Abs: 1853 cells/uL (ref 850–3900)
MCH: 30.3 pg (ref 27.0–33.0)
MCHC: 33 g/dL (ref 32.0–36.0)
MCV: 91.7 fL (ref 80.0–100.0)
MPV: 10.3 fL (ref 7.5–12.5)
Monocytes Relative: 11.2 %
Neutro Abs: 3679 cells/uL (ref 1500–7800)
Neutrophils Relative %: 56.6 %
Platelets: 291 10*3/uL (ref 140–400)
RBC: 4.36 10*6/uL (ref 4.20–5.80)
RDW: 12.5 % (ref 11.0–15.0)
Total Lymphocyte: 28.5 %
WBC: 6.5 10*3/uL (ref 3.8–10.8)

## 2019-11-26 LAB — LIPID PANEL
Cholesterol: 129 mg/dL (ref <200)
HDL: 44 mg/dL (ref >=40)
LDL Cholesterol (Calc): 71 mg/dL (calc)
Non-HDL Cholesterol (Calc): 85 mg/dL (calc) (ref <130)
Total CHOL/HDL Ratio: 2.9 (calc) (ref <5.0)
Triglycerides: 48 mg/dL (ref <150)

## 2019-11-26 LAB — PSA: PSA: 2.66 ng/mL (ref <=4.0)

## 2019-11-29 ENCOUNTER — Encounter: Payer: Self-pay | Admitting: Internal Medicine

## 2019-11-29 DIAGNOSIS — E785 Hyperlipidemia, unspecified: Secondary | ICD-10-CM | POA: Diagnosis not present

## 2019-11-30 LAB — LIPID PANEL
Chol/HDL Ratio: 3.3 ratio (ref 0.0–5.0)
Cholesterol, Total: 139 mg/dL (ref 100–199)
HDL: 42 mg/dL (ref >39)
LDL Chol Calc (NIH): 87 mg/dL (ref 0–99)
Triglycerides: 45 mg/dL (ref 0–149)
VLDL Cholesterol Cal: 10 mg/dL (ref 5–40)

## 2019-12-15 ENCOUNTER — Telehealth: Payer: Self-pay

## 2019-12-15 NOTE — Telephone Encounter (Signed)
Spoke with pt regarding mychart communication about lipid management. Pt states that he has been trying to improve his LDL with diet.  With the recent results of 87 (11/29/19) down from 108 (09/13/19), Dr. Gwenlyn Found would like for pt to start taking atorvastatin (lipitor) 20mg  once daily and we will re-check lipid/liver in 2 months.  Pt verbalizes understanding. Will put orders in for lipid/liver profile and mail to pt.

## 2019-12-20 DIAGNOSIS — Z1211 Encounter for screening for malignant neoplasm of colon: Secondary | ICD-10-CM | POA: Diagnosis not present

## 2019-12-20 DIAGNOSIS — Z1212 Encounter for screening for malignant neoplasm of rectum: Secondary | ICD-10-CM | POA: Diagnosis not present

## 2019-12-20 LAB — COLOGUARD: Cologuard: NEGATIVE

## 2019-12-29 NOTE — Telephone Encounter (Signed)
Received the results of Cologuard screening.   Screening noted negative.   A negative result indicates a low likelihood of colorectal cancer is present. Following a negative Cologuard result, the American Cancer Society recommends a Cologuard re-screening interval of 3 years.   Letter sent.   

## 2019-12-30 ENCOUNTER — Other Ambulatory Visit: Payer: Self-pay | Admitting: *Deleted

## 2019-12-30 DIAGNOSIS — I712 Thoracic aortic aneurysm, without rupture, unspecified: Secondary | ICD-10-CM

## 2020-01-17 ENCOUNTER — Ambulatory Visit: Payer: PPO | Admitting: Gastroenterology

## 2020-02-09 ENCOUNTER — Encounter: Payer: Self-pay | Admitting: Surgery

## 2020-02-09 ENCOUNTER — Other Ambulatory Visit: Payer: Self-pay

## 2020-02-09 ENCOUNTER — Ambulatory Visit
Admission: RE | Admit: 2020-02-09 | Discharge: 2020-02-09 | Disposition: A | Discharge status: Home / Self Care | Payer: PPO | Source: Ambulatory Visit | Attending: Surgery | Admitting: Surgery

## 2020-02-09 ENCOUNTER — Ambulatory Visit: Payer: PPO | Admitting: Surgery

## 2020-02-09 VITALS — BP 151/66 | HR 70 | Resp 20 | Ht 73.0 in | Wt 180.0 lb

## 2020-02-09 DIAGNOSIS — J8410 Pulmonary fibrosis, unspecified: Secondary | ICD-10-CM | POA: Diagnosis not present

## 2020-02-09 DIAGNOSIS — I712 Thoracic aortic aneurysm, without rupture, unspecified: Secondary | ICD-10-CM

## 2020-02-09 DIAGNOSIS — J9811 Atelectasis: Secondary | ICD-10-CM | POA: Diagnosis not present

## 2020-02-09 DIAGNOSIS — I2510 Atherosclerotic heart disease of native coronary artery without angina pectoris: Secondary | ICD-10-CM | POA: Diagnosis not present

## 2020-02-09 MED ORDER — IOPAMIDOL (ISOVUE-370) INJECTION 76%
75.0000 mL | Freq: Once | INTRAVENOUS | Status: AC | PRN
  Administered 2020-02-09: 75 mL via INTRAVENOUS

## 2020-02-09 NOTE — Progress Notes (Signed)
HPI:  The patient is a 75 year old gentleman with bicuspid aortic valve and a history of a stable 4.9 cm ascending aortic aneurysm that has been stable since it was diagnosed in 2019.  He has significant aortic valve calcification with a mean gradient of 19 mmHg by echo in October 2020 consistent with mild to moderate aortic stenosis.  There was also mild to moderate aortic insufficiency.  He had a follow-up echocardiogram on 08/23/2019 which showed a mean gradient across the aortic valve of 16 mmHg with mild aortic insufficiency.  The ascending aorta was measured at 49 mm.  Left ventricular ejection fraction was normal at 60 to 65%.  Since I saw him in July he has been feeling well.  He denies chest pain or shortness of breath.  Current Outpatient Medications  Medication Sig Dispense Refill  . amoxicillin (AMOXIL) 875 MG tablet Take 1 tablet (875 mg total) by mouth 2 (two) times daily. 20 tablet 0  . apixaban (ELIQUIS) 5 MG TABS tablet TAKE 1 TABLET BY MOUTH 2 TIMES DAILY. 180 tablet 2  . Cholecalciferol 100 MCG (4000 UT) CAPS Take 4,000 Units by mouth daily.    . Cyanocobalamin (VITAMIN B-12 CR) 1500 MCG TBCR Take 1 tablet by mouth daily.    Marland Kitchen GLUCOSAMINE HCL PO Take 2,000 mg by mouth daily.    Marland Kitchen lisinopril (ZESTRIL) 5 MG tablet TAKE 1 TABLET BY MOUTH EVERY DAY 90 tablet 3  . Omega-3 Fatty Acids (FISH OIL) 1000 MG CAPS Take 1,000 mg by mouth daily.     Marland Kitchen atorvastatin (LIPITOR) 20 MG tablet Take 1 tablet (20 mg total) by mouth daily. 90 tablet 3  . metoprolol tartrate (LOPRESSOR) 25 MG tablet Take 0.5 tablets (12.5 mg total) by mouth every 6 (six) hours as needed (palpitations). 30 tablet 1   No current facility-administered medications for this visit.     Physical Exam: BP (!) 151/66 (BP Location: Left Arm, Patient Position: Sitting)   Pulse 70   Resp 20   Ht 6\' 1"  (1.854 m)   Wt 180 lb (81.6 kg)   SpO2 96% Comment: RA with mask on  BMI 23.75 kg/m  He looks well. Cardiac exam  shows a regular rate and rhythm with a 2/6 systolic murmur along the right sternal border.  There is no diastolic murmur. Lungs are clear. There is no peripheral edema.  Diagnostic Tests:  ECHOCARDIOGRAM REPORT       Patient Name:  Jerry Velazquez Date of Exam: 08/23/2019  Medical Rec #: [id number]   Height:    73.0 in  Accession #:  [id number]  Weight:    185.0 lb  Date of Birth: Dec 20, 1945   BSA:     2.082 m  Patient Age:  78 years   BP:      139/78 mmHg  Patient Gender: M       HR:      56 bpm.  Exam Location: [facility]   Procedure: 2D Echo, Cardiac Doppler, Color Doppler and Strain Analysis   Indications:  I35.0 Aortic Stenosis    History:    Patient has prior history of Echocardiogram examinations,  most         recent 10/23/2018. Thoracic aortic aneurysm withot rupture,         Arrythmias:Atrial Fibrillation; Risk Factors:Dyslipidemia.    Sonographer:  Marygrace Drought RCS  Referring Phys: [place]    1. Left ventricular ejection fraction, by estimation,  is 60 to 65%. The  left ventricle has normal function. The left ventricle has no regional  wall motion abnormalities. There is mild left ventricular hypertrophy.  Left ventricular diastolic parameters  are consistent with Grade I diastolic dysfunction (impaired relaxation).  2. Right ventricular systolic function is normal. The right ventricular  size is normal. There is normal pulmonary artery systolic pressure.  3. Left atrial size was mildly dilated.  4. SAM noted but no outflow tract obstruction. The mitral valve is  myxomatous. No evidence of mitral valve regurgitation. No evidence of  mitral stenosis.  5. The aortic valve has an indeterminant number of cusps. Aortic valve  regurgitation is mild. Mild aortic valve stenosis. Aortic valve mean  gradient measures 16.0 mmHg. Aortic valve Vmax measures 2.68 m/s.   6. Aortic dilatation noted. There is moderate dilatation of the ascending  aorta measuring 49 mm.  7. The inferior vena cava is normal in size with greater than 50%  respiratory variability, suggesting right atrial pressure of 3 mmHg.   Comparison(s): Prior aorta measurment 50 mm. Unchanged.   FINDINGS  Left Ventricle: Left ventricular ejection fraction, by estimation, is 60  to 65%. The left ventricle has normal function. The left ventricle has no  regional wall motion abnormalities. The left ventricular internal cavity  size was normal in size. There is  mild left ventricular hypertrophy. Left ventricular diastolic parameters  are consistent with Grade I diastolic dysfunction (impaired relaxation).   Right Ventricle: The right ventricular size is normal. No increase in  right ventricular wall thickness. Right ventricular systolic function is  normal. There is normal pulmonary artery systolic pressure. The tricuspid  regurgitant velocity is 2.17 m/s, and  with an assumed right atrial pressure of 3 mmHg, the estimated right  ventricular systolic pressure is 31.5 mmHg.   Left Atrium: Left atrial size was mildly dilated.   Right Atrium: Right atrial size was normal in size.   Pericardium: There is no evidence of pericardial effusion.   Mitral Valve: SAM noted but no outflow tract obstruction. The mitral valve  is myxomatous. There is moderate thickening of the mitral valve  leaflet(s). Normal mobility of the mitral valve leaflets. No evidence of  mitral valve regurgitation. No evidence of  mitral valve stenosis.   Tricuspid Valve: The tricuspid valve is normal in structure. Tricuspid  valve regurgitation is trivial. No evidence of tricuspid stenosis.   Aortic Valve: The aortic valve has an indeterminant number of cusps. .  There is severe thickening and severe calcifcation of the aortic valve.  Aortic valve regurgitation is mild. Aortic regurgitation PHT measures 606   msec. Mild aortic stenosis is  present. There is severe thickening of the aortic valve. There is severe  calcifcation of the aortic valve. Aortic valve mean gradient measures 16.0  mmHg. Aortic valve peak gradient measures 28.7 mmHg. Aortic valve area, by  VTI measures 1.36 cm.   Pulmonic Valve: The pulmonic valve was normal in structure. Pulmonic valve  regurgitation is trivial. No evidence of pulmonic stenosis.   Aorta: Aortic dilatation noted. There is moderate dilatation of the  ascending aorta measuring 49 mm.   Venous: The inferior vena cava is normal in size with greater than 50%  respiratory variability, suggesting right atrial pressure of 3 mmHg.   IAS/Shunts: No atrial level shunt detected by color flow Doppler.     LEFT VENTRICLE  PLAX 2D  LVIDd:     4.50 cm Diastology  LVIDs:  3.30 cm LV e' lateral:  7.94 cm/s  LV PW:     1.20 cm LV E/e' lateral: 6.6  LV IVS:    1.20 cm LV e' medial:  6.09 cm/s  LVOT diam:   2.20 cm LV E/e' medial: 8.6  LV SV:     83  LV SV Index:  40    2D Longitudinal Strain  LVOT Area:   3.80 cm 2D Strain GLS Avg:   -21.8 %     RIGHT VENTRICLE  RV Basal diam: 3.50 cm  RV S prime:   9.68 cm/s  TAPSE (M-mode): 1.8 cm  RVSP:      21.8 mmHg   LEFT ATRIUM       Index    RIGHT ATRIUM      Index  LA diam:    4.40 cm 2.11 cm/m RA Pressure: 3.00 mmHg  LA Vol (A2C):  41.4 ml 19.91 ml/m RA Area:   14.40 cm  LA Vol (A4C):  44.0 ml 21.14 ml/m RA Volume:  37.70 ml 18.11 ml/m  LA Biplane Vol: 34.8 ml 16.72 ml/m  AORTIC VALVE  AV Area (Vmax):  1.20 cm  AV Area (Vmean):  1.32 cm  AV Area (VTI):   1.36 cm  AV Vmax:      268.00 cm/s  AV Vmean:     191.000 cm/s  AV VTI:      0.611 m  AV Peak Grad:   28.7 mmHg  AV Mean Grad:   16.0 mmHg  LVOT Vmax:     84.90 cm/s  LVOT Vmean:    66.300 cm/s  LVOT VTI:     0.219 m  LVOT/AV  VTI ratio: 0.36  AI PHT:      606 msec    AORTA  Ao Root diam: 4.10 cm  Ao Asc diam: 4.90 cm   MITRAL VALVE        TRICUSPID VALVE  MV Area (PHT):       TR Peak grad:  18.8 mmHg  MV Decel Time:       TR Vmax:    217.00 cm/s  MV E velocity: 52.30 cm/s Estimated RAP: 3.00 mmHg  MV A velocity: 49.30 cm/s RVSP:      21.8 mmHg  MV E/A ratio: 1.06               SHUNTS               Systemic VTI: 0.22 m               Systemic Diam: 2.20 cm   Candee Furbish MD  Electronically signed by Candee Furbish MD  Signature Date/Time: 08/23/2019/12:02:14 PM    Narrative & Impression  CLINICAL DATA:  Thoracic aortic prominence  EXAM: CT ANGIOGRAPHY CHEST WITH CONTRAST  TECHNIQUE: Multidetector CT imaging of the chest was performed using the standard protocol during bolus administration of intravenous contrast. Multiplanar CT image reconstructions and MIPs were obtained to evaluate the vascular anatomy.  CONTRAST:  38mL ISOVUE-370 IOPAMIDOL (ISOVUE-370) INJECTION 76%  COMPARISON:  CT angiogram chest August 11, 2019  FINDINGS: Cardiovascular: Ascending thoracic aortic diameter measures 4.8 x 4.8 cm, stable. Measured diameter at the sinuses of Valsalva measures 4.3 cm. Measured diameter at the aortic arch measures 2.9 cm. Descending thoracic aortic measurement at the main pulmonary outflow tract level measures 3.0 x 3.0 cm. No evident thoracic dissection. There are scattered foci of calcification in the visualized great vessels. There are scattered foci of  aortic atherosclerosis. There are occasional foci of coronary artery calcification. No pericardial effusion or pericardial thickening. No major vessel pulmonary embolus. Note that contrast bolus was not timed to optimize pulmonary arterial vascular visualization.  Mediastinum/Nodes: Thyroid appears normal. No adenopathy evident. No esophageal lesions  appreciable.  Lungs/Pleura: No edema or airspace opacity. Slight left base atelectasis posteriorly noted. No evident pleural effusion. Small calcified granuloma in the posterior segment right upper lobe is stable. No pneumothorax. Trachea and major bronchial structures appear patent.  Upper Abdomen: There are scattered cysts in the liver, stable. Visualized upper abdominal structures otherwise unremarkable in appearance.  Musculoskeletal: There is thoracic dextroscoliosis. There are foci of degenerative change in the thoracic spine. There are no blastic or lytic bone lesions. No evident chest wall lesions.  Review of the MIP images confirms the above findings.  IMPRESSION: 1. Essentially stable ascending thoracic aortic prominence measuring 4.8 x 4.8 cm. Other thoracic aortic measurements as noted. No dissection. There are foci of aortic atherosclerosis as well as foci of great vessel and coronary artery calcification.  2. Small granuloma right upper lobe. No edema or airspace opacity. Slightly posterior left base atelectasis. No pleural effusions.  3.  No evident adenopathy.  Aortic aneurysm NOS (ICD10-I71.9). Aortic Atherosclerosis (ICD10-I70.0).   Electronically Signed   By: Lowella Grip III M.D.   On: 02/09/2020 11:04    Impression:  This 75 year old gentleman has a bicuspid aortic valve with mild aortic stenosis and a stable 4.8 cm fusiform ascending aortic aneurysm.  His aneurysm is below the usual surgical threshold of 5 cm in patients with bicuspid aortic valve and I would tend to be conservative in a 75 year old patient.  I reviewed the CTA images and the echocardiogram images with the patient and his wife answered their questions.  I stressed the importance of continued good blood pressure control and preventing further enlargement and acute aortic dissection.  I recommended a follow-up CTA of the chest in 6 months and if his aneurysm is stable then we  may stretch that out to 1 year after that.  He should have a follow-up echocardiogram yearly unless he develops symptoms suggesting worsening aortic stenosis.  Plan:  I will plan to see him back in 6 months with a CTA of the chest.  He will continue to follow-up with his PCP and Dr. Gwenlyn Found for his cardiology care.  I spent 20 minutes performing this established patient evaluation and > 50% of this time was spent face to face counseling and coordinating the care of this patient's aortic aneurysm.    Gaye Pollack, MD [facility] [phone]

## 2020-02-10 ENCOUNTER — Ambulatory Visit: Payer: PPO | Admitting: Cardiothoracic Surgery

## 2020-04-21 ENCOUNTER — Ambulatory Visit (INDEPENDENT_AMBULATORY_CARE_PROVIDER_SITE_OTHER): Payer: PPO | Admitting: Otolaryngology

## 2020-04-24 ENCOUNTER — Telehealth: Payer: Self-pay | Admitting: Family Medicine

## 2020-04-24 NOTE — Progress Notes (Signed)
  Chronic Care Management   Outreach Note  04/24/2020 Name: Jerry Velazquez MRN: [id number] DOB: 11/13/1945  Referred by: Susy Frizzle, MD Reason for referral : No chief complaint on file.   An unsuccessful telephone outreach was attempted today. The patient was referred to the pharmacist for assistance with care management and care coordination.   Follow Up Plan:   Carley Perdue [employer] Scheduler

## 2020-05-01 DIAGNOSIS — Z20822 Contact with and (suspected) exposure to covid-19: Secondary | ICD-10-CM | POA: Diagnosis not present

## 2020-05-05 ENCOUNTER — Ambulatory Visit (INDEPENDENT_AMBULATORY_CARE_PROVIDER_SITE_OTHER): Payer: PPO | Admitting: Otolaryngology

## 2020-05-08 ENCOUNTER — Telehealth: Payer: Self-pay | Admitting: Family Medicine

## 2020-05-08 NOTE — Progress Notes (Signed)
  Chronic Care Management   Note  05/08/2020 Name: Jerry Velazquez MRN: [id number] DOB: 07/01/1945  Jerry Velazquez is a 75 y.o. year old male who is a primary care patient of Pickard, Cammie Mcgee, MD. I reached out to [employer] by phone today in response to a referral sent by [name]'s PCP, Dennard Schaumann Cammie Mcgee, MD.   [name] was given information about Chronic Care Management services today including:  1. CCM service includes personalized support from designated clinical staff supervised by his physician, including individualized plan of care and coordination with other care providers 2. 24/7 contact phone numbers for assistance for urgent and routine care needs. 3. Service will only be billed when office clinical staff spend 20 minutes or more in a month to coordinate care. 4. Only one practitioner may furnish and bill the service in a calendar month. 5. The patient may stop CCM services at any time (effective at the end of the month) by phone call to the office staff.   Patient agreed to services and verbal consent obtained.   Follow up plan:   Carley Perdue [employer] Scheduler

## 2020-05-19 ENCOUNTER — Other Ambulatory Visit: Payer: Self-pay

## 2020-05-19 ENCOUNTER — Ambulatory Visit (INDEPENDENT_AMBULATORY_CARE_PROVIDER_SITE_OTHER): Payer: PPO | Admitting: Otolaryngology

## 2020-05-19 ENCOUNTER — Encounter (INDEPENDENT_AMBULATORY_CARE_PROVIDER_SITE_OTHER): Payer: Self-pay | Admitting: Otolaryngology

## 2020-05-19 VITALS — Temp 96.3°F

## 2020-05-19 DIAGNOSIS — H6123 Impacted cerumen, bilateral: Secondary | ICD-10-CM

## 2020-05-19 NOTE — Progress Notes (Signed)
HPI: Jerry Velazquez is a 75 y.o. male who presents for evaluation of blockage of his right ear.  He is known that he has had some hearing loss in the left ear for a number of years but has never had a hearing test performed.  More recently his right ear has been more blocked.  His left ear improves sometimes when he pulls on the ear..  Past Medical History:  Diagnosis Date  . Aortic aneurysm ([place])   . Aortic insufficiency due to bicuspid aortic valve   . Aortic stenosis due to bicuspid aortic valve   . Atrial flutter ([place])   . Bicuspid aortic valve   . GERD (gastroesophageal reflux disease)    past hx- stopped alcohol and no more issues   . NSVT (nonsustained ventricular tachycardia) ([place])   . Paroxysmal atrial fibrillation ([facility])   . PVC's (premature ventricular contractions)   . RBBB    Past Surgical History:  Procedure Laterality Date  . COLONOSCOPY     12 yrs ago [facility]- normal per pt   . CYST EXCISION     left knee   . CYST EXCISION     tongue   Social History   Socioeconomic History  . Marital status: Married    Spouse name: Not on file  . Number of children: Not on file  . Years of education: Not on file  . Highest education level: Not on file  Occupational History  . 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 Smoking status: Former Smoker    Years: 18.00    Types: Pipe    Quit date: 1982    Years since quitting: 40.3  . Smokeless tobacco: Never Used  . Tobacco comment: smoked a pipe at age 55 to 67  Vaping Use  . Vaping Use: Never used  Substance and Sexual Activity  . Alcohol use: Not Currently  . Drug use: Never  . Sexual activity: Yes  Other Topics Concern  . Not on file  Social History Narrative   Lives in [place]   Retired English as a second language teacher   Social Determinants of Health   Financial Resource Strain: Not on file  Food Insecurity: Not on file  Transportation Needs: Not on file  Physical Activity: Not on file   Stress: Not on file  Social Connections: Not on file   Family History  Problem Relation Age of Onset  . Heart disease Mother   . Hypertension Father   . Stroke Father   . COPD Sister   . Colon polyps Sister   . Hypertension Brother   . COPD Brother   . Colon cancer Neg Hx   . Esophageal cancer Neg Hx   . Rectal cancer Neg Hx   . Stomach cancer Neg Hx    No Known Allergies Prior to Admission medications   Medication Sig Start Date End Date Taking? Authorizing Provider  amoxicillin (AMOXIL) 875 MG tablet Take 1 tablet (875 mg total) by mouth 2 (two) times daily. 11/25/19   Susy Frizzle, MD  apixaban (ELIQUIS) 5 MG TABS tablet TAKE 1 TABLET BY MOUTH 2 TIMES DAILY. 11/19/19   Lorretta Harp, MD  atorvastatin (LIPITOR) 20 MG tablet Take 1 tablet (20 mg total) by mouth daily. 09/28/19 12/27/19  Lorretta Harp, MD  Cholecalciferol 100 MCG (4000 UT) CAPS Take 4,000 Units by mouth daily.    [provider]  Cyanocobalamin (VITAMIN B-12 CR) 1500 MCG  TBCR Take 1 tablet by mouth daily.    [provider]  GLUCOSAMINE HCL PO Take 2,000 mg by mouth daily.    [provider]  lisinopril (ZESTRIL) 5 MG tablet TAKE 1 TABLET BY MOUTH EVERY DAY 11/24/19   Lorretta Harp, MD  metoprolol tartrate (LOPRESSOR) 25 MG tablet Take 0.5 tablets (12.5 mg total) by mouth every 6 (six) hours as needed (palpitations). 08/03/18 08/31/19  Sherran Needs, NP  Omega-3 Fatty Acids (FISH OIL) 1000 MG CAPS Take 1,000 mg by mouth daily.     [provider]     Positive ROS: Otherwise negative  All other systems have been reviewed and were otherwise negative with the exception of those mentioned in the HPI and as above.  Physical Exam: Constitutional: Alert, well-appearing, no acute distress Ears: External ears without lesions or tenderness. Ear canals right ear canal is completely occluded with a large piece of hard firm wax that was removed with curette and forceps.   The left ear canal had a small amount of wax that was removed with a curette.  TMs were clear bilaterally.  Remaining ear canals clear.. Nasal: External nose without lesions. Clear nasal passages Oral: Oropharynx clear. Neck: No palpable adenopathy or masses Respiratory: Breathing comfortably  Skin: No facial/neck lesions or rash noted.  Cerumen impaction removal  Date/Time: 05/19/2020 1:05 PM Performed by: Rozetta Nunnery, MD Authorized by: Rozetta Nunnery, MD   Consent:    Consent obtained:  Verbal   Consent given by:  Patient   Risks discussed:  Pain and bleeding Procedure details:    Location:  L ear and R ear   Procedure type: curette and forceps   Post-procedure details:    Inspection:  TM intact and canal normal   Hearing quality:  Improved   Patient tolerance of procedure:  Tolerated well, no immediate complications Comments:     Patient with a large amount of wax and buildup both ear canals right side worse than left.  This was cleaned with curette and forceps.  TMs were clear bilaterally.    Assessment: Cerumen impactions right side worse than left  Plan: Ear canals were cleaned in the office with improved hearing. He will follow-up as needed.  Radene Journey, MD

## 2020-05-25 ENCOUNTER — Encounter: Payer: Self-pay | Admitting: Family Medicine

## 2020-05-25 ENCOUNTER — Other Ambulatory Visit: Payer: Self-pay

## 2020-05-25 ENCOUNTER — Ambulatory Visit (INDEPENDENT_AMBULATORY_CARE_PROVIDER_SITE_OTHER): Payer: PPO | Admitting: Family Medicine

## 2020-05-25 VITALS — BP 136/72 | HR 60 | Temp 98.7°F | Resp 14 | Ht 73.0 in | Wt 182.0 lb

## 2020-05-25 DIAGNOSIS — E7800 Pure hypercholesterolemia, unspecified: Secondary | ICD-10-CM

## 2020-05-25 DIAGNOSIS — R972 Elevated prostate specific antigen [PSA]: Secondary | ICD-10-CM

## 2020-05-25 DIAGNOSIS — I480 Paroxysmal atrial fibrillation: Secondary | ICD-10-CM

## 2020-05-25 DIAGNOSIS — Q231 Congenital insufficiency of aortic valve: Secondary | ICD-10-CM

## 2020-05-25 DIAGNOSIS — I10 Essential (primary) hypertension: Secondary | ICD-10-CM | POA: Diagnosis not present

## 2020-05-25 NOTE — Progress Notes (Signed)
Subjective:    Patient ID: Jerry Velazquez, male    DOB: Jan 07, 1946, 75 y.o.   MRN: [id number]  HPI  11/21 Patient is a very pleasant 75 year old Caucasian male here today for complete physical exam.  Past medical history significant for a bicuspid aortic valve, and a sending aortic aneurysm that is 4.9 cm followed by his cardiologist Dr. Gwenlyn Found, paroxysmal atrial fibrillation.  He no longer smokes.  He no longer drinks.  He is avoiding caffeine.  He recently had RSV however symptoms have lasted 3 weeks and he now has pain and pressure in his frontal and maxillary sinuses with postnasal drip and coughing suggesting a secondary sinus infection.  Wife was diagnosed with RSV.  He is overdue for prostate cancer screening.  He is overdue for colonoscopy.  At that time, my plan was: Patient denies any falls, depression, or memory loss.  I believe he most likely has developed a secondary sinus infection after an initial viral upper respiratory infection so I will treat him with amoxicillin 875 mg twice daily for 10 days.  He is overdue for colonoscopy.  Therefore will order a colonoscopy for the patient however after discussing this he is uncertain if he wants to stop his anticoagulant to do a colonoscopy.  Therefore we also discussed Cologuard.  Afterwards he asked me to schedule him for the Cologuard.  I will check for prostate cancer with a PSA.  I will check a CMP, CBC, and fasting lipid panel.  His aneurysm is monitored by his cardiologist.  He does have a substantial murmur over the aortic valve but this is followed by his cardiologist.  He is due for Prevnar 13, booster on his Covid shot, shingles vaccine, and a flu shot but he defers all of these at the present time until he is feeling better.  05/25/20 At the patient's last visit, his PSA was found to be 2.6.  It was 1.1 the year before.  Therefore, there was a significant increase.  Today he denies any lower urinary tract symptoms.  He denies any dysuria,  urgency, frequency, hesitancy, or hematuria.  Patient still has a 4/6 systolic ejection murmur heard over the aortic valve consistent with his congenital bicuspid aortic valve.  This is being monitored in August by his cardiologist.  Last ultrasound showed a 49 mm thoracic aortic aneurysm.  He denies any chest pain or syncope or lightheadedness or palpitations or tachyarrhythmias.  He is due for Prevnar 20 but he politely declines this today.  He is also due for the fourth COVID shot and the shingles vaccine and I recommended that he get these at his local pharmacy.  Otherwise he is been doing well and he has no concerns Past Medical History:  Diagnosis Date  . Aortic aneurysm ([place])   . Aortic insufficiency due to bicuspid aortic valve   . Aortic stenosis due to bicuspid aortic valve   . Atrial flutter ([place])   . Bicuspid aortic valve   . GERD (gastroesophageal reflux disease)    past hx- stopped alcohol and no more issues   . NSVT (nonsustained ventricular tachycardia) ([place])   . Paroxysmal atrial fibrillation ([facility])   . PVC's (premature ventricular contractions)   . RBBB    Past Surgical History:  Procedure Laterality Date  . COLONOSCOPY     12 yrs ago [facility]- normal per pt   . CYST EXCISION     left knee   . CYST EXCISION     tongue  Current Outpatient Medications on File Prior to Visit  Medication Sig Dispense Refill  . amoxicillin (AMOXIL) 875 MG tablet Take 1 tablet (875 mg total) by mouth 2 (two) times daily. 20 tablet 0  . apixaban (ELIQUIS) 5 MG TABS tablet TAKE 1 TABLET BY MOUTH 2 TIMES DAILY. 180 tablet 2  . atorvastatin (LIPITOR) 20 MG tablet Take 1 tablet (20 mg total) by mouth daily. 90 tablet 3  . Cholecalciferol 100 MCG (4000 UT) CAPS Take 4,000 Units by mouth daily.    . Cyanocobalamin (VITAMIN B-12 CR) 1500 MCG TBCR Take 1 tablet by mouth daily.    Marland Kitchen GLUCOSAMINE HCL PO Take 2,000 mg by mouth daily.    Marland Kitchen lisinopril (ZESTRIL) 5 MG tablet TAKE 1 TABLET BY MOUTH EVERY DAY  90 tablet 3  . metoprolol tartrate (LOPRESSOR) 25 MG tablet Take 0.5 tablets (12.5 mg total) by mouth every 6 (six) hours as needed (palpitations). 30 tablet 1  . Omega-3 Fatty Acids (FISH OIL) 1000 MG CAPS Take 1,000 mg by mouth daily.      No current facility-administered medications on file prior to visit.   No Known Allergies Social History   Socioeconomic History  . Marital status: Married    Spouse name: Not on file  . Number of children: Not on file  . Years of education: Not on file  . Highest education level: Not on file  Occupational History  . 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 Smoking status: Former Smoker    Years: 18.00    Types: Pipe    Quit date: 1982    Years since quitting: 40.3  . Smokeless tobacco: Never Used  . Tobacco comment: smoked a pipe at age 48 to 56  Vaping Use  . Vaping Use: Never used  Substance and Sexual Activity  . Alcohol use: Not Currently  . Drug use: Never  . Sexual activity: Yes  Other Topics Concern  . Not on file  Social History Narrative   Lives in [place]   Retired English as a second language teacher   Social Determinants of Health   Financial Resource Strain: Not on file  Food Insecurity: Not on file  Transportation Needs: Not on file  Physical Activity: Not on file  Stress: Not on file  Social Connections: Not on file  Intimate Partner Violence: Not on file   Family History  Problem Relation Age of Onset  . Heart disease Mother   . Hypertension Father   . Stroke Father   . COPD Sister   . Colon polyps Sister   . Hypertension Brother   . COPD Brother   . Colon cancer Neg Hx   . Esophageal cancer Neg Hx   . Rectal cancer Neg Hx   . Stomach cancer Neg Hx       Review of Systems  All other systems reviewed and are negative.      Objective:   Physical Exam Vitals reviewed.  Constitutional:      General: He is not in acute distress.    Appearance: Normal appearance. He is normal  weight. He is not ill-appearing, toxic-appearing or diaphoretic.  HENT:     Head: Normocephalic and atraumatic.     Right Ear: Tympanic membrane and ear canal normal. There is no impacted cerumen.     Left Ear: Tympanic membrane and ear canal normal. There is no impacted cerumen.     Nose: Congestion and rhinorrhea present.  Mouth/Throat:     Pharynx: No oropharyngeal exudate or posterior oropharyngeal erythema.  Eyes:     General: No scleral icterus.       Right eye: No discharge.        Left eye: No discharge.     Extraocular Movements: Extraocular movements intact.     Conjunctiva/sclera: Conjunctivae normal.     Pupils: Pupils are equal, round, and reactive to light.  Neck:     Vascular: No carotid bruit.  Cardiovascular:     Rate and Rhythm: Normal rate and regular rhythm.     Pulses: Normal pulses.     Heart sounds: Murmur heard.  No friction rub. No gallop.   Pulmonary:     Effort: Pulmonary effort is normal. No respiratory distress.     Breath sounds: Normal breath sounds. No stridor. No wheezing, rhonchi or rales.  Chest:     Chest wall: No tenderness.  Abdominal:     General: Abdomen is flat. Bowel sounds are normal. There is no distension.     Palpations: Abdomen is soft. There is no mass.     Tenderness: There is no abdominal tenderness. There is no guarding or rebound.     Hernia: No hernia is present.  Musculoskeletal:     Cervical back: Normal range of motion and neck supple.     Right lower leg: No edema.  Lymphadenopathy:     Cervical: No cervical adenopathy.  Skin:    General: Skin is warm.     Coloration: Skin is not jaundiced or pale.     Findings: No bruising, erythema, lesion or rash.  Neurological:     General: No focal deficit present.     Mental Status: He is alert and oriented to person, place, and time. Mental status is at baseline.     Cranial Nerves: No cranial nerve deficit.     Motor: No weakness.     Coordination: Coordination normal.      Gait: Gait normal.     Deep Tendon Reflexes: Reflexes normal.  Psychiatric:        Mood and Affect: Mood normal.        Behavior: Behavior normal.        Thought Content: Thought content normal.        Judgment: Judgment normal.           Assessment & Plan:  Paroxysmal atrial fibrillation ([place]) - Plan: CBC with Differential/Platelet, COMPLETE METABOLIC PANEL WITH GFR, Lipid panel  Benign essential HTN - Plan: CBC with Differential/Platelet, COMPLETE METABOLIC PANEL WITH GFR, Lipid panel  Bicuspid aortic valve - Plan: CBC with Differential/Platelet, COMPLETE METABOLIC PANEL WITH GFR, Lipid panel  Pure hypercholesterolemia  Blood pressure today is excellent at 136/72.  I will check a CBC, CMP, lipid panel while the patient is here.  Goal LDL cholesterol is less than 100.  Monitor renal function as well as hemoglobin due to his Eliquis use.  Heart rate is well controlled and in normal sinus rhythm today.  However I will also check a PSA and if there is substantial increase in his PSA we may need to consult urology.

## 2020-05-26 LAB — COMPLETE METABOLIC PANEL WITH GFR
AG Ratio: 1.5 (calc) (ref 1.0–2.5)
ALT: 34 U/L (ref 9–46)
AST: 26 U/L (ref 10–35)
Albumin: 4.1 g/dL (ref 3.6–5.1)
Alkaline phosphatase (APISO): 58 U/L (ref 35–144)
BUN/Creatinine Ratio: 16 (calc) (ref 6–22)
BUN: 11 mg/dL (ref 7–25)
CO2: 27 mmol/L (ref 20–32)
Calcium: 9.6 mg/dL (ref 8.6–10.3)
Chloride: 104 mmol/L (ref 98–110)
Creat: 0.69 mg/dL — ABNORMAL LOW (ref 0.70–1.18)
GFR, Est African American: 108 mL/min/{1.73_m2} (ref >=60)
GFR, Est Non African American: 93 mL/min/{1.73_m2} (ref >=60)
Globulin: 2.7 g/dL (calc) (ref 1.9–3.7)
Glucose, Bld: 81 mg/dL (ref 65–99)
Potassium: 5 mmol/L (ref 3.5–5.3)
Sodium: 138 mmol/L (ref 135–146)
Total Bilirubin: 0.7 mg/dL (ref 0.2–1.2)
Total Protein: 6.8 g/dL (ref 6.1–8.1)

## 2020-05-26 LAB — LIPID PANEL
Cholesterol: 131 mg/dL (ref <200)
HDL: 58 mg/dL (ref >=40)
LDL Cholesterol (Calc): 62 mg/dL (calc)
Non-HDL Cholesterol (Calc): 73 mg/dL (calc) (ref <130)
Total CHOL/HDL Ratio: 2.3 (calc) (ref <5.0)
Triglycerides: 37 mg/dL (ref <150)

## 2020-05-26 LAB — CBC WITH DIFFERENTIAL/PLATELET
Absolute Monocytes: 584 cells/uL (ref 200–950)
Basophils Absolute: 41 cells/uL (ref 0–200)
Basophils Relative: 0.9 %
Eosinophils Absolute: 212 cells/uL (ref 15–500)
Eosinophils Relative: 4.6 %
HCT: 39.5 % (ref 38.5–50.0)
Hemoglobin: 13.1 g/dL — ABNORMAL LOW (ref 13.2–17.1)
Lymphs Abs: 1840 cells/uL (ref 850–3900)
MCH: 31.5 pg (ref 27.0–33.0)
MCHC: 33.2 g/dL (ref 32.0–36.0)
MCV: 95 fL (ref 80.0–100.0)
MPV: 10.8 fL (ref 7.5–12.5)
Monocytes Relative: 12.7 %
Neutro Abs: 1923 cells/uL (ref 1500–7800)
Neutrophils Relative %: 41.8 %
Platelets: 189 10*3/uL (ref 140–400)
RBC: 4.16 10*6/uL — ABNORMAL LOW (ref 4.20–5.80)
RDW: 12.7 % (ref 11.0–15.0)
Total Lymphocyte: 40 %
WBC: 4.6 10*3/uL (ref 3.8–10.8)

## 2020-05-26 LAB — PSA: PSA: 0.99 ng/mL (ref <=4.00)

## 2020-06-13 ENCOUNTER — Encounter: Payer: Self-pay | Admitting: Family Medicine

## 2020-06-13 ENCOUNTER — Ambulatory Visit
Admission: RE | Admit: 2020-06-13 | Discharge: 2020-06-13 | Disposition: A | Discharge status: Home / Self Care | Payer: PPO | Source: Ambulatory Visit | Attending: Family Medicine | Admitting: Family Medicine

## 2020-06-13 ENCOUNTER — Other Ambulatory Visit: Payer: Self-pay

## 2020-06-13 ENCOUNTER — Ambulatory Visit (INDEPENDENT_AMBULATORY_CARE_PROVIDER_SITE_OTHER): Payer: PPO | Admitting: Family Medicine

## 2020-06-13 VITALS — BP 128/66 | HR 68 | Temp 98.2°F | Resp 14 | Ht 73.0 in | Wt 185.0 lb

## 2020-06-13 DIAGNOSIS — M5442 Lumbago with sciatica, left side: Secondary | ICD-10-CM | POA: Diagnosis not present

## 2020-06-13 DIAGNOSIS — M5441 Lumbago with sciatica, right side: Secondary | ICD-10-CM

## 2020-06-13 DIAGNOSIS — M545 Low back pain, unspecified: Secondary | ICD-10-CM | POA: Diagnosis not present

## 2020-06-13 NOTE — Progress Notes (Signed)
Subjective:    Patient ID: Jerry Velazquez, male    DOB: January 29, 1945, 75 y.o.   MRN: [id number]  HPI  11/21 Patient is a very pleasant 75 year old Caucasian male here today for complete physical exam.  Past medical history significant for a bicuspid aortic valve, and a sending aortic aneurysm that is 4.9 cm followed by his cardiologist Dr. Gwenlyn Found, paroxysmal atrial fibrillation.  He no longer smokes.  He no longer drinks.  He is avoiding caffeine.  He recently had RSV however symptoms have lasted 3 weeks and he now has pain and pressure in his frontal and maxillary sinuses with postnasal drip and coughing suggesting a secondary sinus infection.  Wife was diagnosed with RSV.  He is overdue for prostate cancer screening.  He is overdue for colonoscopy.  At that time, my plan was: Patient denies any falls, depression, or memory loss.  I believe he most likely has developed a secondary sinus infection after an initial viral upper respiratory infection so I will treat him with amoxicillin 875 mg twice daily for 10 days.  He is overdue for colonoscopy.  Therefore will order a colonoscopy for the patient however after discussing this he is uncertain if he wants to stop his anticoagulant to do a colonoscopy.  Therefore we also discussed Cologuard.  Afterwards he asked me to schedule him for the Cologuard.  I will check for prostate cancer with a PSA.  I will check a CMP, CBC, and fasting lipid panel.  His aneurysm is monitored by his cardiologist.  He does have a substantial murmur over the aortic valve but this is followed by his cardiologist.  He is due for Prevnar 13, booster on his Covid shot, shingles vaccine, and a flu shot but he defers all of these at the present time until he is feeling better.  06/13/20 Patient is a very pleasant 75 year old Caucasian male who presents with 5 years of back pain.  He states that the pain has been tolerable up until the last 6 weeks.  Is been steadily getting worse.  He  states today on a daily basis he has pain to the right paraspinal muscles.  He also occasionally has right-sided sciatica that radiates down his posterior right leg into his right knee.  He also occasionally has left-sided sciatica that will also radiate from his left gluteus down to his left leg.  He denies any saddle anesthesia or bowel or bladder incontinence.  He denies any recent falls or injuries.  The back pain is just become progressively worse.  He denies any neurogenic claudication. Past Medical History:  Diagnosis Date  . Aortic aneurysm ([place])   . Aortic insufficiency due to bicuspid aortic valve   . Aortic stenosis due to bicuspid aortic valve   . Atrial flutter ([place])   . Bicuspid aortic valve   . GERD (gastroesophageal reflux disease)    past hx- stopped alcohol and no more issues   . NSVT (nonsustained ventricular tachycardia) ([place])   . Paroxysmal atrial fibrillation ([facility])   . PVC's (premature ventricular contractions)   . RBBB    Past Surgical History:  Procedure Laterality Date  . COLONOSCOPY     12 yrs ago [facility]- normal per pt   . CYST EXCISION     left knee   . CYST EXCISION     tongue   Current Outpatient Medications on File Prior to Visit  Medication Sig Dispense Refill  . apixaban (ELIQUIS) 5 MG TABS tablet TAKE 1  TABLET BY MOUTH 2 TIMES DAILY. 180 tablet 2  . atorvastatin (LIPITOR) 20 MG tablet Take 1 tablet (20 mg total) by mouth daily. 90 tablet 3  . Cholecalciferol 100 MCG (4000 UT) CAPS Take 4,000 Units by mouth daily.    . Cyanocobalamin (VITAMIN B-12 CR) 1500 MCG TBCR Take 1 tablet by mouth daily.    Marland Kitchen GLUCOSAMINE HCL PO Take 2,000 mg by mouth daily.    Marland Kitchen lisinopril (ZESTRIL) 5 MG tablet TAKE 1 TABLET BY MOUTH EVERY DAY 90 tablet 3  . metoprolol tartrate (LOPRESSOR) 25 MG tablet Take 0.5 tablets (12.5 mg total) by mouth every 6 (six) hours as needed (palpitations). 30 tablet 1  . Omega-3 Fatty Acids (FISH OIL) 1000 MG CAPS Take 1,000 mg by mouth daily.       No current facility-administered medications on file prior to visit.   No Known Allergies Social History   Socioeconomic History  . Marital status: Married    Spouse name: Not on file  . Number of children: Not on file  . Years of education: Not on file  . Highest education level: Not on file  Occupational History  . 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 Smoking status: Former Smoker    Years: 18.00    Types: Pipe    Quit date: 1982    Years since quitting: 40.4  . Smokeless tobacco: Never Used  . Tobacco comment: smoked a pipe at age 46 to 66  Vaping Use  . Vaping Use: Never used  Substance and Sexual Activity  . Alcohol use: Not Currently  . Drug use: Never  . Sexual activity: Yes  Other Topics Concern  . Not on file  Social History Narrative   Lives in [place]   Retired English as a second language teacher   Social Determinants of Health   Financial Resource Strain: Not on file  Food Insecurity: Not on file  Transportation Needs: Not on file  Physical Activity: Not on file  Stress: Not on file  Social Connections: Not on file  Intimate Partner Violence: Not on file   Family History  Problem Relation Age of Onset  . Heart disease Mother   . Hypertension Father   . Stroke Father   . COPD Sister   . Colon polyps Sister   . Hypertension Brother   . COPD Brother   . Colon cancer Neg Hx   . Esophageal cancer Neg Hx   . Rectal cancer Neg Hx   . Stomach cancer Neg Hx       Review of Systems  All other systems reviewed and are negative.      Objective:   Physical Exam Vitals reviewed.  Constitutional:      General: He is not in acute distress.    Appearance: Normal appearance. He is normal weight. He is not ill-appearing, toxic-appearing or diaphoretic.  HENT:     Head: Normocephalic and atraumatic.     Nose: Rhinorrhea present.     Mouth/Throat:     Pharynx: No oropharyngeal exudate or posterior oropharyngeal erythema.   Eyes:     General: No scleral icterus.       Right eye: No discharge.        Left eye: No discharge.     Extraocular Movements: Extraocular movements intact.     Conjunctiva/sclera: Conjunctivae normal.     Pupils: Pupils are equal, round, and reactive to light.  Cardiovascular:  Rate and Rhythm: Normal rate and regular rhythm.     Pulses: Normal pulses.     Heart sounds: Murmur heard.  No friction rub. No gallop.   Pulmonary:     Effort: Pulmonary effort is normal. No respiratory distress.     Breath sounds: Normal breath sounds. No stridor. No wheezing, rhonchi or rales.  Chest:     Chest wall: No tenderness.  Abdominal:     General: Abdomen is flat. Bowel sounds are normal. There is no distension.     Palpations: Abdomen is soft. There is no mass.     Tenderness: There is no abdominal tenderness. There is no guarding or rebound.     Hernia: No hernia is present.  Musculoskeletal:     Lumbar back: Spasms and tenderness present. No bony tenderness. Decreased range of motion.       Back:     Right lower leg: No edema.  Skin:    General: Skin is warm.     Coloration: Skin is not jaundiced or pale.     Findings: No bruising, erythema, lesion or rash.  Neurological:     General: No focal deficit present.     Mental Status: He is alert and oriented to person, place, and time. Mental status is at baseline.     Cranial Nerves: No cranial nerve deficit.     Motor: No weakness.     Coordination: Coordination normal.     Gait: Gait normal.     Deep Tendon Reflexes: Reflexes normal.  Psychiatric:        Mood and Affect: Mood normal.        Behavior: Behavior normal.        Thought Content: Thought content normal.        Judgment: Judgment normal.           Assessment & Plan:  Midline low back pain with bilateral sciatica, unspecified chronicity - Plan: DG Lumbar Spine Complete  Based on his symptoms, I am concerned about central spinal canal stenosis versus multiple  herniated disc in the lumbar spine.  Begin by obtaining an x-ray of the lumbar spine.  Based upon the x-ray results, we will either try combination of muscle relaxers and gabapentin versus proceeding with an MRI to evaluate further.  Patient denies any symptoms of cauda equina syndrome.

## 2020-06-14 ENCOUNTER — Telehealth: Payer: Self-pay | Admitting: Pharmacist

## 2020-06-14 NOTE — Progress Notes (Addendum)
    Chronic Care Management Pharmacy Assistant   Name: SEKAI GITLIN  MRN: [id number] DOB: [birth year]  Jerry Velazquez is an 75 y.o. year old male who presents for his initial CCM visit with the clinical pharmacist.  Reason for Encounter: Chart Prep   Conditions to be addressed/monitored: HTN, Afib.  Primary concerns for visit include: HTN  Recent office visits:  06/13/20 Dr. Dennard Schaumann For low back pain. X-rays preformed. No medication changes.  05/25/20 Dr. Dennard Schaumann For Afib. Labs drawn. No medication changes.  Recent consult visits:  05/19/20 Otolaryngology Rozetta Nunnery, MD. For bilateral impacted cerumen. No medication changes. 05/01/20 [facility] For exposure to COVID-19.  02/09/20 Cardiothoracic Surgery Vartle, Fernande Boyden, MD. For thoracic aortic aneurysm. No medication changes.   Hospital visits:  None in previous 6 months   Medication History: Atorvastatin 20 mg 90 DS 04/07/20 Lisinopril 5 mg 90 DS 05/21/20  Medications: Outpatient Encounter Medications as of 06/14/2020  Medication Sig   apixaban (ELIQUIS) 5 MG TABS tablet TAKE 1 TABLET BY MOUTH 2 TIMES DAILY.   atorvastatin (LIPITOR) 20 MG tablet Take 1 tablet (20 mg total) by mouth daily.   Cholecalciferol 100 MCG (4000 UT) CAPS Take 4,000 Units by mouth daily.   Cyanocobalamin (VITAMIN B-12 CR) 1500 MCG TBCR Take 1 tablet by mouth daily.   GLUCOSAMINE HCL PO Take 2,000 mg by mouth daily.   lisinopril (ZESTRIL) 5 MG tablet TAKE 1 TABLET BY MOUTH EVERY DAY   metoprolol tartrate (LOPRESSOR) 25 MG tablet Take 0.5 tablets (12.5 mg total) by mouth every 6 (six) hours as needed (palpitations).   Omega-3 Fatty Acids (FISH OIL) 1000 MG CAPS Take 1,000 mg by mouth daily.    No facility-administered encounter medications on file as of 06/14/2020.   Have you seen any other providers since your last visit? Patient stated no.  Any changes in your medications or health? Patient stated no.   Any side effects from any  medications? Patient stated no.  Do you have an symptoms or problems not managed by your medications? Patient stated sciatica/back pain.  Any concerns about your health right now? Patient stated he is concerned about his Afib.   Has your provider asked that you check blood pressure, blood sugar, or follow special diet at home?  Patient stated no.  Do you get any type of exercise on a regular basis? Patient stated he walks for about 30 min daily.   Can you think of a goal you would like to reach for your health? Patient stated he would like to have less pain.   Do you have any problems getting your medications? Patient stated no.   Is there anything that you would like to discuss during the appointment? Patient stated no.  Please bring medications and supplements to appointment, patient reminded of his face to face appointment on 06/15/20 at 330 pm.  Follow-Up:Pham[facility] Review   Charlann Lange, [place] Pharmacist Assistant [phone]

## 2020-06-15 ENCOUNTER — Ambulatory Visit (INDEPENDENT_AMBULATORY_CARE_PROVIDER_SITE_OTHER): Payer: PPO | Admitting: Pharmacist

## 2020-06-15 ENCOUNTER — Other Ambulatory Visit: Payer: Self-pay

## 2020-06-15 DIAGNOSIS — E785 Hyperlipidemia, unspecified: Secondary | ICD-10-CM

## 2020-06-15 DIAGNOSIS — I480 Paroxysmal atrial fibrillation: Secondary | ICD-10-CM | POA: Diagnosis not present

## 2020-06-15 DIAGNOSIS — I10 Essential (primary) hypertension: Secondary | ICD-10-CM

## 2020-06-15 NOTE — Progress Notes (Signed)
Chronic Care Management Pharmacy Note  06/15/2020 Name:  Jerry Velazquez MRN:  [id number] DOB:  Apr 23, 1945  Summary: General review of patient's medication.  Patient does mention he wishes to proceed with MRI as per Dr. Samella Parr recommendations.  Recommendations/Changes made from today's visit: No changes made at today's visit  Plan: MRI of lumbar spine - then determine best course of action for back pain  Subjective: Jerry Velazquez is an 75 y.o. year old male who is a primary patient of Pickard, Cammie Mcgee, MD.  The CCM team was consulted for assistance with disease management and care coordination needs.    Engaged with patient face to face for initial visit in response to provider referral for pharmacy case management and/or care coordination services.   Consent to Services:  The patient was given the following information about Chronic Care Management services today, agreed to services, and gave verbal consent: 1. CCM service includes personalized support from designated clinical staff supervised by the primary care provider, including individualized plan of care and coordination with other care providers 2. 24/7 contact phone numbers for assistance for urgent and routine care needs. 3. Service will only be billed when office clinical staff spend 20 minutes or more in a month to coordinate care. 4. Only one practitioner may furnish and bill the service in a calendar month. 5.The patient may stop CCM services at any time (effective at the end of the month) by phone call to the office staff. 6. The patient will be responsible for cost sharing (co-pay) of up to 20% of the service fee (after annual deductible is met). Patient agreed to services and consent obtained.  Patient Care Team: Susy Frizzle, MD as PCP - General (Family Medicine) Lorretta Harp, MD as PCP - Cardiology (Cardiology) [name], PA-C as Physician Assistant (Family Medicine) Gala Romney Cristopher Estimable, MD as Consulting  Physician (Gastroenterology) [name], [facility] as Pharmacist (Pharmacist)  Recent office visits:  06/13/20 Dr. Dennard Schaumann For low back pain. X-rays preformed. No medication changes.  05/25/20 Dr. Dennard Schaumann For Afib. Labs drawn. No medication changes.  Recent consult visits:  05/19/20 Otolaryngology Rozetta Nunnery, MD. For bilateral impacted cerumen. No medication changes. 05/01/20 [facility] For exposure to COVID-19.  02/09/20 Cardiothoracic Surgery Vartle, Fernande Boyden, MD. For thoracic aortic aneurysm. No medication changes.   Hospital visits:  None in previous 6 months   Medication History: Atorvastatin 20 mg 90 DS 04/07/20 Lisinopril 5 mg 90 DS 05/21/20  Objective:  Lab Results  Component Value Date   CREATININE 0.69 (L) 05/25/2020   BUN 11 05/25/2020   GFRNONAA 93 05/25/2020   GFRAA 108 05/25/2020   NA 138 05/25/2020   K 5.0 05/25/2020   CALCIUM 9.6 05/25/2020   CO2 27 05/25/2020   GLUCOSE 81 05/25/2020    No results found for: HGBA1C, FRUCTOSAMINE, GFR, MICROALBUR  Last diabetic Eye exam: No results found for: HMDIABEYEEXA  Last diabetic Foot exam: No results found for: HMDIABFOOTEX   Lab Results  Component Value Date   CHOL 131 05/25/2020   HDL 58 05/25/2020   LDLCALC 62 05/25/2020   TRIG 37 05/25/2020   CHOLHDL 2.3 05/25/2020    Hepatic Function Latest Ref Rng & Units 05/25/2020 11/25/2019 09/13/2019  Total Protein 6.1 - 8.1 g/dL 6.8 7.1 6.8  Albumin 3.7 - 4.7 g/dL - - 4.0  AST 10 - 35 U/L 26 20 39  ALT 9 - 46 U/L 34 23 48(H)  Alk Phosphatase 48 -  121 IU/L - - 75  Total Bilirubin 0.2 - 1.2 mg/dL 0.7 0.7 0.5  Bilirubin, Direct 0.00 - 0.40 mg/dL - - 0.13    Lab Results  Component Value Date/Time   TSH 1.47 09/03/2017 09:34 AM    CBC Latest Ref Rng & Units 05/25/2020 11/25/2019 08/10/2018  WBC 3.8 - 10.8 Thousand/uL 4.6 6.5 5.5  Hemoglobin 13.2 - 17.1 g/dL 13.1(L) 13.2 14.3  Hematocrit 38.5 - 50.0 % 39.5 40.0 43.9  Platelets 140 - 400 Thousand/uL  189 291 218    No results found for: VD25OH  Clinical ASCVD: Yes  The 10-year ASCVD risk score Mikey Bussing DC Jr., et al., 2013) is: 23.9%   Values used to calculate the score:     Age: 79 years     Sex: Male     Is Non-Hispanic African American: No     Diabetic: No     Tobacco smoker: No     Systolic Blood Pressure: 950 mmHg     Is BP treated: Yes     HDL Cholesterol: 58 mg/dL     Total Cholesterol: 131 mg/dL    Depression screen [facility] 2/9 05/25/2020 11/25/2019 09/17/2017  Decreased Interest 0 0 0  Down, Depressed, Hopeless 0 0 0  PHQ - 2 Score 0 0 0     Social History   Tobacco Use  Smoking Status Former Smoker  . Years: 18.00  . Types: Pipe  . Quit date: 58  . Years since quitting: 40.4  Smokeless Tobacco Never Used  Tobacco Comment   smoked a pipe at age 62 to 19   BP Readings from Last 3 Encounters:  06/13/20 128/66  05/25/20 136/72  02/09/20 (!) 151/66   Pulse Readings from Last 3 Encounters:  06/13/20 68  05/25/20 60  02/09/20 70   Wt Readings from Last 3 Encounters:  06/13/20 185 lb (83.9 kg)  05/25/20 182 lb (82.6 kg)  02/09/20 180 lb (81.6 kg)   BMI Readings from Last 3 Encounters:  06/13/20 24.41 kg/m  05/25/20 24.01 kg/m  02/09/20 23.75 kg/m    Assessment/Interventions: Review of patient past medical history, allergies, medications, health status, including review of consultants reports, laboratory and other test data, was performed as part of comprehensive evaluation and provision of chronic care management services.   SDOH:  (Social Determinants of Health) assessments and interventions performed: Yes   Financial Resource Strain: Low Risk   . Difficulty of Paying Living Expenses: Not very hard    SDOH Screenings   Alcohol Screen: Low Risk   . Last Alcohol Screening Score (AUDIT): 0  Depression (PHQ2-9): Low Risk   . PHQ-2 Score: 0  Financial Resource Strain: Low Risk   . Difficulty of Paying Living Expenses: Not very hard  Food  Insecurity: Not on file  Housing: Not on file  Physical Activity: Not on file  Social Connections: Not on file  Stress: Not on file  Tobacco Use: Medium Risk  . Smoking Tobacco Use: Former Smoker  . Smokeless Tobacco Use: Never Used  Transportation Needs: Not on file    [facility]  No Known Allergies  Medications Reviewed Today    Reviewed by [name], [facility] (Pharmacist) on 06/15/20 at 57  Med List Status: <None>  Medication Order Taking? Sig Documenting Provider Last Dose Status Informant  apixaban (ELIQUIS) 5 MG TABS tablet [id number] Yes TAKE 1 TABLET BY MOUTH 2 TIMES DAILY. Lorretta Harp, MD Taking Active   atorvastatin (LIPITOR) 20 MG  tablet [id number]  Take 1 tablet (20 mg total) by mouth daily. Lorretta Harp, MD  Expired 12/27/19 2359   Cholecalciferol 100 MCG (4000 UT) CAPS [id number] Yes Take 4,000 Units by mouth daily. [provider] Taking Active Self  Cyanocobalamin (VITAMIN B-12 CR) 1500 MCG TBCR [id number] Yes Take 1 tablet by mouth daily. [provider] Taking Active Self  GLUCOSAMINE HCL PO [id number] Yes Take 2,000 mg by mouth daily. [provider] Taking Active Self  lisinopril (ZESTRIL) 5 MG tablet [id number] Yes TAKE 1 TABLET BY MOUTH EVERY DAY Lorretta Harp, MD Taking Active   metoprolol tartrate (LOPRESSOR) 25 MG tablet [id number]  Take 0.5 tablets (12.5 mg total) by mouth every 6 (six) hours as needed (palpitations). Sherran Needs, NP  Expired 08/31/19 2359 Self  Omega-3 Fatty Acids (FISH OIL) 1000 MG CAPS [id number] Yes Take 1,000 mg by mouth daily.  [provider] Taking Active Self          Patient Active Problem List   Diagnosis Date Noted  . Paroxysmal atrial fibrillation ([place]) 11/03/2018  . Essential hypertension 03/13/2018  . Thoracic aortic aneurysm ([place]) 12/10/2017  . Bicuspid aortic valve 12/10/2017  . Dyslipidemia 09/18/2017  . Paroxysmal supraventricular tachycardia ([place]) 09/18/2017   . Former pipe smoker 09/18/2017  . Aortic insufficiency 09/09/2017  . Palpitations 09/04/2014    Immunization History  Administered Date(s) Administered  . Fluad Quad(high Dose 65+) 09/15/2018  . Moderna Sars-Covid-2 Vaccination 03/27/2019, 11/08/2019, 06/05/2020  . Pneumococcal Polysaccharide-23 09/15/2018    Conditions to be addressed/monitored:  Tachycardia, HTN, Afib,  Dyslipidemia  Care Plan : General Pharmacy (Adult)  Updates made by [name], RPH since 06/15/2020 12:00 AM    Problem: Tachycardia, HTN, Afib,  Dyslipidemia   Priority: High  Onset Date: 06/15/2020    Long-Range Goal: Patient-Specific Goal   Start Date: 06/15/2020  Expected End Date: 12/15/2020  This Visit's Progress: On track  Priority: High  Note:   Current Barriers:  . Unable to achieve control of back pain   Pharmacist Clinical Goal(s):  Marland Kitchen Patient will achieve control of pain as evidenced by pain level . adhere to prescribed medication regimen as evidenced by fill dates . contact provider office for questions/concerns as evidenced notation of same in electronic health record through collaboration with PharmD and provider.   Interventions: . 1:1 collaboration with Susy Frizzle, MD regarding development and update of comprehensive plan of care as evidenced by provider attestation and co-signature . Inter-disciplinary care team collaboration (see longitudinal plan of care) . Comprehensive medication review performed; medication list updated in electronic medical record  Hypertension (BP goal <140/90) -Uncontrolled -Current treatment: . Lisinopril 33m daily -Medications previously tried: none noted  -Current home readings: not checking -Current dietary habits: well rounded diet -Current exercise habits: walks for about 30 minutes daily on a regular basis -Denies hypotensive/hypertensive symptoms -Educated on BP goals and benefits of medications for prevention of heart attack, stroke and  kidney damage; Exercise goal of 150 minutes per week; Importance of home blood pressure monitoring; Symptoms of hypotension and importance of maintaining adequate hydration; -Counseled to monitor BP at home if symptomatic, document, and provide log at future appointments -Recommended to continue current medication  Hyperlipidemia: (LDL goal < 70) -Controlled -Current treatment: . Atorvastatin 263mdaily -Medications previously tried: none noted  -Educated on Cholesterol goals;  Benefits of statin for ASCVD risk reduction; Importance of limiting foods high in cholesterol; Exercise goal of 150 minutes per week;  -  Denies any adverse effects, adherent to medication, LDL is controlled -Recommended to continue current medication  Atrial Fibrillation/Tachycardia (Goal: prevent stroke and major bleeding) -Controlled  -Current treatment: . Rate control: Metoprolol tartrate 64m one-half tablet prn palpitations . Anticoagulation: Eliquis 585mBID -Medications previously tried: none noted -Home BP and HR readings: does not check, controlled in office  -Counseled on increased risk of stroke due to Afib and benefits of anticoagulation for stroke prevention; importance of adherence to anticoagulant exactly as prescribed; bleeding risk associated with Eliquis and importance of self-monitoring for signs/symptoms of bleeding; avoidance of NSAIDs due to increased bleeding risk with anticoagulants;  -He denies any abnormal bleeding or bruising -Takes metoprolol on prn basis, has experienced bradycardia in the past -Recommended to continue current medication Assessed patient finances. Currently Eliquis is affordable   Patient Goals/Self-Care Activities . Patient will:  - take medications as prescribed check blood pressure periodically, document, and provide at future appointments target a minimum of 150 minutes of moderate intensity exercise weekly  Follow Up Plan: The care management team will  reach out to the patient again over the next 180 days.        Medication Assistance: None required.  Patient affirms current coverage meets needs.  Compliance/Adherence/Medication fill history:  Star-Rating Drugs: Atorvastatin 20 mg 90 DS 04/07/20 Lisinopril 5 mg 90 DS 05/21/20  [facility]'s [facility]  [id number] [address] [id number]: [phone]: [phone] pill box? Yes Pt endorses 100% compliance  We discussed: Benefits of medication synchronization, packaging and delivery as well as enhanced pharmacist oversight with [employer]. Patient decided to: Continue current medication management strategy  Care Plan and Follow Up Patient Decision:  Patient agrees to Care Plan and Follow-up.  Plan: The care management team will reach out to the patient again over the next 180 days.  ChrBeverly MilchharmD Clinical Pharmacist [phone]

## 2020-06-15 NOTE — Patient Instructions (Addendum)
Visit Information Thank you for meeting with me today!  I look forward to working with you to help you meet all of your healthcare goals and answer any questions you may have.  Feel free to contact me anytime!  Goals Addressed            This Visit's Progress   . Track and Manage Heart Rate and Rhythm-Atrial Fibrillation       Timeframe:  Long-Range Goal Priority:  High Start Date:   06/15/20                          Expected End Date:  12/15/20                     Follow Up Date 10/13/20   - make a plan to exercise regularly - make a plan to eat healthy - take medicine as prescribed    Why is this important?    Atrial fibrillation may have no symptoms. Sometimes the symptoms get worse or happen more often.   It is important to keep track of what your symptoms are and when they happen.   A change in symptoms is important to discuss with your doctor or nurse.   Being active and healthy eating will also help you manage your heart condition.     Notes:       Patient Care Plan: General Pharmacy (Adult)    Problem Identified: Tachycardia, HTN, Afib,  Dyslipidemia   Priority: High  Onset Date: 06/15/2020    Long-Range Goal: Patient-Specific Goal   Start Date: 06/15/2020  Expected End Date: 12/15/2020  This Visit's Progress: On track  Priority: High  Note:   Current Barriers:  . Unable to achieve control of back pain   Pharmacist Clinical Goal(s):  Marland Kitchen Patient will achieve control of pain as evidenced by pain level . adhere to prescribed medication regimen as evidenced by fill dates . contact provider office for questions/concerns as evidenced notation of same in electronic health record through collaboration with PharmD and provider.   Interventions: . 1:1 collaboration with Susy Frizzle, MD regarding development and update of comprehensive plan of care as evidenced by provider attestation and co-signature . Inter-disciplinary care team collaboration (see longitudinal  plan of care) . Comprehensive medication review performed; medication list updated in electronic medical record  Hypertension (BP goal <140/90) -Uncontrolled -Current treatment: . Lisinopril 5mg  daily -Medications previously tried: none noted  -Current home readings: not checking -Current dietary habits: well rounded diet -Current exercise habits: walks for about 30 minutes daily on a regular basis -Denies hypotensive/hypertensive symptoms -Educated on BP goals and benefits of medications for prevention of heart attack, stroke and kidney damage; Exercise goal of 150 minutes per week; Importance of home blood pressure monitoring; Symptoms of hypotension and importance of maintaining adequate hydration; -Counseled to monitor BP at home if symptomatic, document, and provide log at future appointments -Recommended to continue current medication  Hyperlipidemia: (LDL goal < 70) -Controlled -Current treatment: . Atorvastatin 20mg  daily -Medications previously tried: none noted  -Educated on Cholesterol goals;  Benefits of statin for ASCVD risk reduction; Importance of limiting foods high in cholesterol; Exercise goal of 150 minutes per week;  -Denies any adverse effects, adherent to medication, LDL is controlled -Recommended to continue current medication  Atrial Fibrillation/Tachycardia (Goal: prevent stroke and major bleeding) -Controlled  -Current treatment: . Rate control: Metoprolol tartrate 25mg  one-half tablet prn palpitations . Anticoagulation: Eliquis  5mg  BID -Medications previously tried: none noted -Home BP and HR readings: does not check, controlled in office  -Counseled on increased risk of stroke due to Afib and benefits of anticoagulation for stroke prevention; importance of adherence to anticoagulant exactly as prescribed; bleeding risk associated with Eliquis and importance of self-monitoring for signs/symptoms of bleeding; avoidance of NSAIDs due to increased  bleeding risk with anticoagulants;  -He denies any abnormal bleeding or bruising -Takes metoprolol on prn basis, has experienced bradycardia in the past -Recommended to continue current medication Assessed patient finances. Currently Eliquis is affordable   Patient Goals/Self-Care Activities . Patient will:  - take medications as prescribed check blood pressure periodically, document, and provide at future appointments target a minimum of 150 minutes of moderate intensity exercise weekly  Follow Up Plan: The care management team will reach out to the patient again over the next 180 days.       Jerry Velazquez was given information about Chronic Care Management services today including:  1. CCM service includes personalized support from designated clinical staff supervised by his physician, including individualized plan of care and coordination with other care providers 2. 24/7 contact phone numbers for assistance for urgent and routine care needs. 3. Standard insurance, coinsurance, copays and deductibles apply for chronic care management only during months in which we provide at least 20 minutes of these services. Most insurances cover these services at 100%, however patients may be responsible for any copay, coinsurance and/or deductible if applicable. This service may help you avoid the need for more expensive face-to-face services. 4. Only one practitioner may furnish and bill the service in a calendar month. 5. The patient may stop CCM services at any time (effective at the end of the month) by phone call to the office staff.  Patient agreed to services and verbal consent obtained.   The patient verbalized understanding of instructions, educational materials, and care plan provided today and agreed to receive a mailed copy of patient instructions, educational materials, and care plan.  Telephone follow up appointment with pharmacy team member scheduled for: 6 months  [name],  [name] Atrial Fibrillation  Atrial fibrillation is a type of irregular or rapid heartbeat (arrhythmia). In atrial fibrillation, the top part of the heart (atria) beats in an irregular pattern. This makes the heart unable to pump blood normally and effectively. The goal of treatment is to prevent blood clots from forming, control your heart rate, or restore your heartbeat to a normal rhythm. If this condition is not treated, it can cause serious problems, such as a weakened heart muscle (cardiomyopathy) or a stroke. What are the causes? This condition is often caused by medical conditions that damage the heart's electrical system. These include:  High blood pressure (hypertension). This is the most common cause.  Certain heart problems or conditions, such as heart failure, coronary artery disease, heart valve problems, or heart surgery.  Diabetes.  Overactive thyroid (hyperthyroidism).  Obesity.  Chronic kidney disease. In some cases, the cause of this condition is not known. What increases the risk? This condition is more likely to develop in:  Older people.  People who smoke.  Athletes who do endurance exercise.  People who have a family history of atrial fibrillation.  Men.  People who use drugs.  People who drink a lot of alcohol.  People who have lung conditions, such as emphysema, pneumonia, or COPD.  People who have obstructive sleep apnea. What are the signs or symptoms? Symptoms of  this condition include:  A feeling that your heart is racing or beating irregularly.  Discomfort or pain in your chest.  Shortness of breath.  Sudden light-headedness or weakness.  Tiring easily during exercise or activity.  Fatigue.  Syncope (fainting).  Sweating. In some cases, there are no symptoms. How is this diagnosed? Your health care provider may detect atrial fibrillation when taking your pulse. If detected, this condition may be diagnosed with:  An  electrocardiogram (ECG) to check electrical signals of the heart.  An ambulatory cardiac monitor to record your heart's activity for a few days.  A transthoracic echocardiogram (TTE) to create pictures of your heart.  A transesophageal echocardiogram (TEE) to create even closer pictures of your heart.  A stress test to check your blood supply while you exercise.  Imaging tests, such as a CT scan or chest X-ray.  Blood tests. How is this treated? Treatment depends on underlying conditions and how you feel when you experience atrial fibrillation. This condition may be treated with:  Medicines to prevent blood clots or to treat heart rate or heart rhythm problems.  Electrical cardioversion to reset the heart's rhythm.  A pacemaker to correct abnormal heart rhythm.  Ablation to remove the heart tissue that sends abnormal signals.  Left atrial appendage closure to seal the area where blood clots can form. In some cases, underlying conditions will be treated. Follow these instructions at home: Medicines  Take over-the counter and prescription medicines only as told by your health care provider.  Do not take any new medicines without talking to your health care provider.  If you are taking blood thinners: ? Talk with your health care provider before you take any medicines that contain aspirin or NSAIDs, such as ibuprofen. These medicines increase your risk for dangerous bleeding. ? Take your medicine exactly as told, at the same time every day. ? Avoid activities that could cause injury or bruising, and follow instructions about how to prevent falls. ? Wear a medical alert bracelet or carry a card that lists what medicines you take. Lifestyle  Do not use any products that contain nicotine or tobacco, such as cigarettes, e-cigarettes, and chewing tobacco. If you need help quitting, ask your health care provider.  Eat heart-healthy foods. Talk with a dietitian to make an eating plan  that is right for you.  Exercise regularly as told by your health care provider.  Do not drink alcohol.  Lose weight if you are overweight.  Do not use drugs, including cannabis.      General instructions  If you have obstructive sleep apnea, manage your condition as told by your health care provider.  Do not use diet pills unless your health care provider approves. Diet pills can make heart problems worse.  Keep all follow-up visits as told by your health care provider. This is important. Contact a health care provider if you:  Notice a change in the rate, rhythm, or strength of your heartbeat.  Are taking a blood thinner and you notice more bruising.  Tire more easily when you exercise or do heavy work.  Have a sudden change in weight. Get help right away if you have:  Chest pain, abdominal pain, sweating, or weakness.  Trouble breathing.  Side effects of blood thinners, such as blood in your vomit, stool, or urine, or bleeding that cannot stop.  Any symptoms of a stroke. "BE FAST" is an easy way to remember the main warning signs of a stroke: ? B -  Balance. Signs are dizziness, sudden trouble walking, or loss of balance. ? E - Eyes. Signs are trouble seeing or a sudden change in vision. ? F - Face. Signs are sudden weakness or numbness of the face, or the face or eyelid drooping on one side. ? A - Arms. Signs are weakness or numbness in an arm. This happens suddenly and usually on one side of the body. ? S - Speech. Signs are sudden trouble speaking, slurred speech, or trouble understanding what people say. ? T - Time. Time to call emergency services. Write down what time symptoms started.  Other signs of a stroke, such as: ? A sudden, severe headache with no known cause. ? Nausea or vomiting. ? Seizure. These symptoms may represent a serious problem that is an emergency. Do not wait to see if the symptoms will go away. Get medical help right away. Call your local  emergency services (911 in the [place] Do not drive yourself to the hospital.   Summary  Atrial fibrillation is a type of irregular or rapid heartbeat (arrhythmia).  Symptoms include a feeling that your heart is beating fast or irregularly.  You may be given medicines to prevent blood clots or to treat heart rate or heart rhythm problems.  Get help right away if you have signs or symptoms of a stroke.  Get help right away if you cannot catch your breath or have chest pain or pressure. This information is not intended to replace advice given to you by your health care provider. Make sure you discuss any questions you have with your health care provider. Document Revised: 06/24/2018 Document Reviewed: 06/24/2018 [employer] Patient Education  [employer].

## 2020-06-16 ENCOUNTER — Other Ambulatory Visit: Payer: Self-pay | Admitting: Family Medicine

## 2020-06-16 ENCOUNTER — Other Ambulatory Visit: Payer: Self-pay

## 2020-06-16 ENCOUNTER — Telehealth: Payer: Self-pay

## 2020-06-16 DIAGNOSIS — G8929 Other chronic pain: Secondary | ICD-10-CM

## 2020-06-16 DIAGNOSIS — M5442 Lumbago with sciatica, left side: Secondary | ICD-10-CM

## 2020-06-16 DIAGNOSIS — M5136 Other intervertebral disc degeneration, lumbar region: Secondary | ICD-10-CM

## 2020-06-16 NOTE — Telephone Encounter (Signed)
MRI ordered for pt

## 2020-06-21 ENCOUNTER — Other Ambulatory Visit: Payer: Self-pay | Admitting: Surgery

## 2020-06-21 DIAGNOSIS — I712 Thoracic aortic aneurysm, without rupture, unspecified: Secondary | ICD-10-CM

## 2020-06-29 ENCOUNTER — Other Ambulatory Visit: Payer: PPO

## 2020-07-02 ENCOUNTER — Ambulatory Visit
Admission: RE | Admit: 2020-07-02 | Discharge: 2020-07-02 | Disposition: A | Discharge status: Home / Self Care | Payer: PPO | Source: Ambulatory Visit | Attending: Family Medicine | Admitting: Family Medicine

## 2020-07-02 ENCOUNTER — Other Ambulatory Visit: Payer: Self-pay

## 2020-07-02 DIAGNOSIS — M5136 Other intervertebral disc degeneration, lumbar region: Secondary | ICD-10-CM

## 2020-07-02 DIAGNOSIS — M5127 Other intervertebral disc displacement, lumbosacral region: Secondary | ICD-10-CM | POA: Diagnosis not present

## 2020-07-02 DIAGNOSIS — M48061 Spinal stenosis, lumbar region without neurogenic claudication: Secondary | ICD-10-CM | POA: Diagnosis not present

## 2020-07-02 DIAGNOSIS — M5126 Other intervertebral disc displacement, lumbar region: Secondary | ICD-10-CM | POA: Diagnosis not present

## 2020-07-02 DIAGNOSIS — K7689 Other specified diseases of liver: Secondary | ICD-10-CM | POA: Diagnosis not present

## 2020-07-02 MED ORDER — GADOBENATE DIMEGLUMINE 529 MG/ML IV SOLN
20.0000 mL | Freq: Once | INTRAVENOUS | Status: AC | PRN
  Administered 2020-07-02: 20 mL via INTRAVENOUS

## 2020-07-12 MED ORDER — METOPROLOL TARTRATE 25 MG PO TABS: 12.5000 mg | ORAL_TABLET | Freq: Four times a day (QID) | ORAL | 0 refills | Status: AC | PRN

## 2020-08-09 ENCOUNTER — Ambulatory Visit: Payer: PPO | Admitting: Surgery

## 2020-08-16 ENCOUNTER — Other Ambulatory Visit: Payer: Self-pay | Admitting: Cardiovascular Disease

## 2020-08-16 ENCOUNTER — Other Ambulatory Visit: Payer: PPO

## 2020-08-16 ENCOUNTER — Ambulatory Visit: Payer: PPO | Admitting: Surgery

## 2020-08-16 NOTE — Telephone Encounter (Signed)
Prescription refill request for Eliquis received. Indication:afib Last office visit:berry 08/31/19 Scr:0.69 05/25/20 Age: 65mWeight:83.9kg

## 2020-08-22 ENCOUNTER — Other Ambulatory Visit: Payer: Self-pay

## 2020-08-22 ENCOUNTER — Ambulatory Visit (HOSPITAL_COMMUNITY): Payer: PPO | Attending: Internal Medicine

## 2020-08-22 DIAGNOSIS — I350 Nonrheumatic aortic (valve) stenosis: Secondary | ICD-10-CM | POA: Diagnosis not present

## 2020-08-22 DIAGNOSIS — I351 Nonrheumatic aortic (valve) insufficiency: Secondary | ICD-10-CM | POA: Diagnosis not present

## 2020-08-22 DIAGNOSIS — Q231 Congenital insufficiency of aortic valve: Secondary | ICD-10-CM | POA: Diagnosis not present

## 2020-08-22 LAB — ECHOCARDIOGRAM COMPLETE
AR max vel: 1.24 cm2
AV Area VTI: 1.35 cm2
AV Area mean vel: 1.17 cm2
AV Mean grad: 25 mmHg
AV Peak grad: 42.3 mmHg
Ao pk vel: 3.25 m/s
Area-P 1/2: 2.69 cm2
P 1/2 time: 418 msec
S' Lateral: 3.3 cm

## 2020-08-25 ENCOUNTER — Other Ambulatory Visit: Payer: Self-pay | Admitting: *Deleted

## 2020-08-25 DIAGNOSIS — I351 Nonrheumatic aortic (valve) insufficiency: Secondary | ICD-10-CM

## 2020-09-06 ENCOUNTER — Other Ambulatory Visit: Payer: Self-pay

## 2020-09-06 ENCOUNTER — Ambulatory Visit: Payer: PPO | Admitting: Surgical

## 2020-09-06 ENCOUNTER — Ambulatory Visit
Admission: RE | Admit: 2020-09-06 | Discharge: 2020-09-06 | Disposition: A | Discharge status: Home / Self Care | Payer: PPO | Source: Ambulatory Visit | Attending: Surgery | Admitting: Surgery

## 2020-09-06 VITALS — BP 150/73 | HR 62 | Resp 20 | Ht 73.0 in | Wt 189.0 lb

## 2020-09-06 DIAGNOSIS — I712 Thoracic aortic aneurysm, without rupture, unspecified: Secondary | ICD-10-CM

## 2020-09-06 DIAGNOSIS — I700 Atherosclerosis of aorta: Secondary | ICD-10-CM | POA: Diagnosis not present

## 2020-09-06 MED ORDER — IOPAMIDOL (ISOVUE-370) INJECTION 76%
75.0000 mL | Freq: Once | INTRAVENOUS | Status: AC | PRN
  Administered 2020-09-06: 75 mL via INTRAVENOUS

## 2020-09-06 NOTE — Progress Notes (Signed)
Office Visit  Subjective:    Patient ID: Jerry Velazquez, male    DOB: 1945/09/11, 75 y.o.   MRN: [id number]  Chief Complaint  Patient presents with   Thoracic Aortic Aneurysm    CTA Chest     HPI:TAA Patient is in today for patient seen on today's date and routine follow-up for his thoracic aortic aneurysm.  He underwent CTA today of the chest and the findings are very stable with maximum measurement at 4.8 cm.  He has also had a recent echocardiogram and aortic stenosis is in the moderate range.  He remains relatively asymptomatic.  He does have occasional palpitations that he relates to his history of atrial fibrillation.  He does take occasional as needed metoprolol which calm was these down.  He is on apixaban long-term.  His blood pressure per his report is under good control with systolic mostly in the [id number].  He does admit that he has not been checking as frequently lately as it has been very stable.  On today's office reading systolic is in the [id number] range.  He denies shortness of breath, chest pain, lower extremity edema or other symptoms of congestive failure.  He denies any neurological symptoms and has not had any lightheadedness or dizziness.  For full report of the CTA and echocardiogram see below.  Past Medical History:  Diagnosis Date   Aortic aneurysm ([place])    Aortic insufficiency due to bicuspid aortic valve    Aortic stenosis due to bicuspid aortic valve    Atrial flutter ([facility])    Bicuspid aortic valve    GERD (gastroesophageal reflux disease)    past hx- stopped alcohol and no more issues    NSVT (nonsustained ventricular tachycardia) ([facility])    Paroxysmal atrial fibrillation ([facility])    PVC's (premature ventricular contractions)    RBBB     Past Surgical History:  Procedure Laterality Date   COLONOSCOPY     12 yrs ago [facility]- normal per pt    CYST EXCISION     left knee    CYST EXCISION     tongue    Family History  Problem Relation Age of Onset   Heart disease  Mother    Hypertension Father    Stroke Father    COPD Sister    Colon polyps Sister    Hypertension Brother    COPD Brother    Colon cancer Neg Hx    Esophageal cancer Neg Hx    Rectal cancer Neg Hx    Stomach cancer Neg Hx     Social History   Socioeconomic History   Marital status: Married    Spouse name: Not on file   Number of children: Not on file   Years of education: Not on file   Highest education level: Not on file  Occupational History   Occupation: retired    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  Tobacco Use   Smoking status: Former    Types: Pipe    Quit date: 1982    Years since quitting: 40.6   Smokeless tobacco: Never   Tobacco comments:    smoked a pipe at age 58 to 89  Vaping Use   Vaping Use: Never used  Substance and Sexual Activity   Alcohol use: Not Currently   Drug use: Never   Sexual activity: Yes  Other Topics Concern   Not on file  Social History Narrative   Lives in [place]   Retired  English as a second language teacher   Social Determinants of Health   Financial Resource Strain: Low Risk    Difficulty of Paying Living Expenses: Not very hard  Food Insecurity: Not on file  Transportation Needs: Not on file  Physical Activity: Not on file  Stress: Not on file  Social Connections: Not on file  Intimate Partner Violence: Not on file    Outpatient Medications Prior to Visit  Medication Sig Dispense Refill   apixaban (ELIQUIS) 5 MG TABS tablet TAKE 1 TABLET BY MOUTH TWICE A DAY 180 tablet 1   atorvastatin (LIPITOR) 20 MG tablet Take 1 tablet (20 mg total) by mouth daily. 90 tablet 3   Cholecalciferol 100 MCG (4000 UT) CAPS Take 4,000 Units by mouth daily.     Cyanocobalamin (VITAMIN B-12 CR) 1500 MCG TBCR Take 1 tablet by mouth daily.     GLUCOSAMINE HCL PO Take 2,000 mg by mouth daily.     lisinopril (ZESTRIL) 5 MG tablet TAKE 1 TABLET BY MOUTH EVERY DAY 90 tablet 3   metoprolol tartrate (LOPRESSOR) 25 MG tablet Take 0.5 tablets (12.5 mg total) by  mouth every 6 (six) hours as needed (palpitations). 30 tablet 0   Omega-3 Fatty Acids (FISH OIL) 1000 MG CAPS Take 1,000 mg by mouth daily.      No facility-administered medications prior to visit.    No Known Allergies  Review of Systems  Constitutional:  Negative for activity change, appetite change and fatigue.       He does note some changes in his sleep pattern.  A full system review was not performed.    Objective:    Physical Exam Constitutional:      Appearance: Normal appearance. He is normal weight.  HENT:     Head: Normocephalic and atraumatic.  Cardiovascular:     Rate and Rhythm: Normal rate and regular rhythm.     Heart sounds: Murmur heard.     Comments: 3/6 systolic murmur Pulmonary:     Effort: Pulmonary effort is normal.     Breath sounds: Normal breath sounds. No wheezing, rhonchi or rales.  Abdominal:     General: Abdomen is flat.     Palpations: Abdomen is soft.  Neurological:     General: No focal deficit present.     Mental Status: He is alert and oriented to person, place, and time.  Psychiatric:        Mood and Affect: Mood normal.        Behavior: Behavior normal.        Thought Content: Thought content normal.        Judgment: Judgment normal.    BP (!) 150/73   Pulse 62   Resp 20   Ht '6\' 1"'$  (1.854 m)   Wt 189 lb (85.7 kg)   SpO2 97% Comment: RA  BMI 24.94 kg/m  Wt Readings from Last 3 Encounters:  09/06/20 189 lb (85.7 kg)  06/13/20 185 lb (83.9 kg)  05/25/20 182 lb (82.6 kg)    Health Maintenance Due  Topic Date Due   TETANUS/TDAP  Never done   PNA vac Low Risk Adult (2 of 2 - PCV13) 09/15/2019   COVID-19 Vaccine (4 - Booster for Moderna series) 09/05/2020   INFLUENZA VACCINE  08/14/2020    There are no preventive care reminders to display for this patient.   Lab Results  Component Value Date   TSH 1.47 09/03/2017   Lab Results  Component Value Date   WBC 4.6 05/25/2020  HGB 13.1 (L) 05/25/2020   HCT 39.5  05/25/2020   MCV 95.0 05/25/2020   PLT 189 05/25/2020   Lab Results  Component Value Date   NA 138 05/25/2020   K 5.0 05/25/2020   CO2 27 05/25/2020   GLUCOSE 81 05/25/2020   BUN 11 05/25/2020   CREATININE 0.69 (L) 05/25/2020   BILITOT 0.7 05/25/2020   ALKPHOS 75 09/13/2019   AST 26 05/25/2020   ALT 34 05/25/2020   PROT 6.8 05/25/2020   ALBUMIN 4.0 09/13/2019   CALCIUM 9.6 05/25/2020   ANIONGAP 7 08/10/2018   Lab Results  Component Value Date   CHOL 131 05/25/2020   Lab Results  Component Value Date   HDL 58 05/25/2020   Lab Results  Component Value Date   LDLCALC 62 05/25/2020   Lab Results  Component Value Date   TRIG 37 05/25/2020   Lab Results  Component Value Date   CHOLHDL 2.3 05/25/2020   No results found for: HGBA1C  CT ANGIO CHEST AORTA W/CM & OR WO/CM  Result Date: 09/06/2020 CLINICAL DATA:  Thoracic aortic aneurysm follow-up. EXAM: CT ANGIOGRAPHY CHEST WITH CONTRAST TECHNIQUE: Multidetector CT imaging of the chest was performed using the standard protocol during bolus administration of intravenous contrast. Multiplanar CT image reconstructions and MIPs were obtained to evaluate the vascular anatomy. Creatinine was obtained on site at [facility] at [address] Results: Creatinine 0.7 mg/dL. CONTRAST:  73m ISOVUE-370 IOPAMIDOL (ISOVUE-370) INJECTION 76% COMPARISON:  CTA chest dated February 09, 2020. FINDINGS: Cardiovascular: Stable ascending thoracic aortic aneurysm measuring up to 4.8 cm. No dissection. Coronary, aortic arch, and branch vessel atherosclerotic vascular disease. Prominent aortic valve calcification. Normal heart size. No pericardial effusion. Mediastinum/Nodes: No enlarged mediastinal, hilar, or axillary lymph nodes. Thyroid gland, trachea, and esophagus demonstrate no significant findings. Lungs/Pleura: Lungs are clear. No pleural effusion or pneumothorax. Upper Abdomen: No acute abnormality. Musculoskeletal: No chest wall  abnormality. No acute or significant osseous findings. Unchanged dextroscoliosis. Review of the MIP images confirms the above findings. IMPRESSION: 1. Stable 4.8 cm ascending thoracic aortic aneurysm. 2. Aortic Atherosclerosis (ICD10-I70.0). Electronically Signed   By: WTitus DubinM.D.   On: 09/06/2020 14:18   ECHOCARDIOGRAM COMPLETE  Result Date: 08/22/2020    ECHOCARDIOGRAM REPORT   Patient Name:   Jerry LOPIANODate of Exam: 08/22/2020 Medical Rec #:  [id number]    Height:       73.0 in Accession #:    [id number]   Weight:       185.0 lb Date of Birth:  7March 08, 1947    BSA:          2.082 m Patient Age:    89years      BP:           128/66 mmHg Patient Gender: M             HR:           60 bpm. [place], 3D Echo, Cardiac Doppler, Color Doppler and Strain Analysis Indications:    I35.1 Aortic insufficiency  History:        Patient has prior history of Echocardiogram examinations, most                 recent 08/23/2019. Aortic Valve Disease and Bicuspid aortic valve,                 Arrythmias:Atrial Fibrillation and PSVT. Palpitation;  Risk                 Factors:Dyslipidemia and Former Smoker. Thoracic aortic                 aneurysm.  Sonographer:    Jessee Avers, RDCS Referring Phys: [place]  1. Left ventricular ejection fraction, by estimation, is 55 to 60%. Left ventricular ejection fraction by 3D volume is 57 %. The left ventricle has normal function. The left ventricle has no regional wall motion abnormalities. Left ventricular diastolic  parameters are consistent with Grade I diastolic dysfunction (impaired relaxation). The average left ventricular global longitudinal strain is -21.4 %. The global longitudinal strain is normal.  2. Right ventricular systolic function is normal. The right ventricular size is normal. There is normal pulmonary artery systolic pressure. The estimated right ventricular systolic pressure is [id number] mmHg.  3.  The mitral valve is grossly normal. Trivial mitral valve regurgitation.  4. The aortic valve is heavily calcified with an indeterminate number of cusps. There is severe calcifcation of the aortic valve. There is moderate thickening of the aortic valve. Aortic valve regurgitation is mild. Moderate aortic valve stenosis. Aortic regurgitation PHT measures 418 msec. Aortic valve area, by VTI measures 1.35 cm. Aortic valve mean gradient measures 25.0 mmHg. Aortic valve Vmax measures 3.25 m/s. DI is 0.27.  5. Aortic dilatation noted. There is moderate dilatation of the ascending aorta, measuring 46 mm.  6. The inferior vena cava is dilated in size with >50% respiratory variability, suggesting right atrial pressure of 8 mmHg. Comparison(s): Changes from prior study are noted. 08/23/19 EF 60-65%. Mild AS 31mHg mean PG, 272mg peak PG. Mild AI. FINDINGS  Left Ventricle: Left ventricular ejection fraction, by estimation, is 55 to 60%. Left ventricular ejection fraction by 3D volume is 57 %. The left ventricle has normal function. The left ventricle has no regional wall motion abnormalities. The average left ventricular global longitudinal strain is -21.4 %. The global longitudinal strain is normal. The left ventricular internal cavity size was normal in size. There is no left ventricular hypertrophy. Left ventricular diastolic parameters are consistent  with Grade I diastolic dysfunction (impaired relaxation). Indeterminate filling pressures. Right Ventricle: The right ventricular size is normal. No increase in right ventricular wall thickness. Right ventricular systolic function is normal. There is normal pulmonary artery systolic pressure. The tricuspid regurgitant velocity is 2.41 m/s, and  with an assumed right atrial pressure of 8 mmHg, the estimated right ventricular systolic pressure is [id number]. Left Atrium: Left atrial size was normal in size. Right Atrium: Right atrial size was normal in size. Pericardium: There is  no evidence of pericardial effusion. Mitral Valve: The mitral valve is grossly normal. Trivial mitral valve regurgitation. Tricuspid Valve: The tricuspid valve is grossly normal. Tricuspid valve regurgitation is trivial. Aortic Valve: The aortic valve has an indeterminant number of cusps. There is severe calcifcation of the aortic valve. There is moderate thickening of the aortic valve. Aortic valve regurgitation is mild. Aortic regurgitation PHT measures 418 msec. Moderate aortic stenosis is present. Aortic valve mean gradient measures 25.0 mmHg. Aortic valve peak gradient measures 42.2 mmHg. Aortic valve area, by VTI measures 1.35 cm. Pulmonic Valve: The pulmonic valve was normal in structure. Pulmonic valve regurgitation is not visualized. Aorta: Aortic dilatation noted. There is moderate dilatation of the ascending aorta, measuring 46 mm. Venous: The inferior vena cava is dilated in size with greater than 50% respiratory variability, suggesting right atrial  pressure of 8 mmHg. IAS/Shunts: No atrial level shunt detected by color flow Doppler.  LEFT VENTRICLE PLAX 2D LVIDd:         5.20 cm         Diastology LVIDs:         3.30 cm         LV e' medial:    6.70 cm/s LV PW:         1.00 cm         LV E/e' medial:  8.3 LV IVS:        0.90 cm         LV e' lateral:   7.71 cm/s LVOT diam:     2.50 cm         LV E/e' lateral: 7.2 LV SV:         111 LV SV Index:   53              2D LVOT Area:     4.92 cm        Longitudinal                                Strain                                2D Strain GLS  -22.2 %                                (A2C):                                2D Strain GLS  -20.9 %                                (A3C):                                2D Strain GLS  -21.1 %                                (A4C):                                2D Strain GLS  -21.4 %                                Avg:                                 3D Volume EF                                LV 3D EF:    Left  ventricul                                             ar                                             ejection                                             fraction                                             by 3D                                             volume is                                             57 %.                                 3D Volume EF:                                3D EF:        57 %                                LV EDV:       193 ml                                LV ESV:       83 ml                                LV SV:        110 ml RIGHT VENTRICLE RV Basal diam:  4.10 cm RV S prime:     10.10 cm/s TAPSE (M-mode): 1.9 cm RVSP:           26.2 mmHg LEFT ATRIUM           Index       RIGHT ATRIUM           Index LA diam:      4.70 cm 2.26 cm/m  RA Pressure: 3.00 mmHg LA Vol (A2C): 53.2 ml 25.56 ml/m RA Area:     18.80 cm LA Vol (A4C): 32.2 ml 15.47 ml/m RA Volume:   53.40 ml  25.65 ml/m  AORTIC VALVE AV Area (Vmax):    1.24 cm AV  Area (Vmean):   1.17 cm AV Area (VTI):     1.35 cm AV Vmax:           325.00 cm/s AV Vmean:          237.000 cm/s AV VTI:            0.824 m AV Peak Grad:      42.2 mmHg AV Mean Grad:      25.0 mmHg LVOT Vmax:         81.70 cm/s LVOT Vmean:        56.300 cm/s LVOT VTI:          0.226 m LVOT/AV VTI ratio: 0.27 AI PHT:            418 msec  AORTA Ao Root diam: 5.10 cm Ao Asc diam:  4.60 cm MV E velocity: 55.80 cm/s  TRICUSPID VALVE MV A velocity: 41.00 cm/s  TR Peak grad:   23.2 mmHg MV E/A ratio:  1.36        TR Vmax:        241.00 cm/s                            Estimated RAP:  3.00 mmHg                            RVSP:           26.2 mmHg                             SHUNTS                            Systemic VTI:  0.23 m                            Systemic Diam: 2.50 cm Lyman Bishop MD Electronically signed by Lyman Bishop MD Signature Date/Time: 08/22/2020/11:53:58 AM    Final            Assessment & Plan:    Problem List Items Addressed This Visit     Thoracic aortic aneurysm [facility]) - Primary   The patient is very stable From his TAA perspective.We will repeat CTA of chest in one year.  We did discuss sleep hygiene and BP monitoring. He is aware of getting earlier f/u for changes in symptoms.   No orders of the defined types were placed in this encounter.    John Giovanni, PA-C

## 2020-09-06 NOTE — Patient Instructions (Signed)
CTA of chest in one year

## 2020-09-25 ENCOUNTER — Ambulatory Visit (INDEPENDENT_AMBULATORY_CARE_PROVIDER_SITE_OTHER): Payer: PPO | Admitting: Family Medicine

## 2020-09-25 ENCOUNTER — Encounter: Payer: Self-pay | Admitting: Family Medicine

## 2020-09-25 ENCOUNTER — Other Ambulatory Visit: Payer: Self-pay

## 2020-09-25 VITALS — BP 140/68 | HR 68 | Temp 98.8°F | Resp 14 | Ht 73.0 in | Wt 188.0 lb

## 2020-09-25 DIAGNOSIS — M5432 Sciatica, left side: Secondary | ICD-10-CM | POA: Diagnosis not present

## 2020-09-25 MED ORDER — GABAPENTIN 300 MG PO CAPS: 300.0000 mg | ORAL_CAPSULE | Freq: Every day | ORAL | 3 refills | Status: DC

## 2020-09-25 NOTE — Progress Notes (Signed)
Subjective:    Patient ID: Jerry Velazquez, male    DOB: [birth year], 75 y.o.   MRN: [id number]  HPI  11/21 Patient is a very pleasant 75 year old Caucasian male here today for complete physical exam.  Past medical history significant for a bicuspid aortic valve, and a sending aortic aneurysm that is 4.9 cm followed by his cardiologist Dr. Gwenlyn Velazquez, paroxysmal atrial fibrillation.  He no longer smokes.  He no longer drinks.  He is avoiding caffeine.  He recently had RSV however symptoms have lasted 3 weeks and he now has pain and pressure in his frontal and maxillary sinuses with postnasal drip and coughing suggesting a secondary sinus infection.  Wife was diagnosed with RSV.  He is overdue for prostate cancer screening.  He is overdue for colonoscopy.  At that time, my plan was: Patient denies any falls, depression, or memory loss.  I believe he most likely has developed a secondary sinus infection after an initial viral upper respiratory infection so I will treat him with amoxicillin 875 mg twice daily for 10 days.  He is overdue for colonoscopy.  Therefore will order a colonoscopy for the patient however after discussing this he is uncertain if he wants to stop his anticoagulant to do a colonoscopy.  Therefore we also discussed Cologuard.  Afterwards he asked me to schedule him for the Cologuard.  I will check for prostate cancer with a PSA.  I will check a CMP, CBC, and fasting lipid panel.  His aneurysm is monitored by his cardiologist.  He does have a substantial murmur over the aortic valve but this is followed by his cardiologist.  He is due for Prevnar 13, booster on his Covid shot, shingles vaccine, and a flu shot but he defers all of these at the present time until he is feeling better.  06/13/20 Patient is a very pleasant 76 year old Caucasian male who presents with 5 years of back pain.  He states that the pain has been tolerable up until the last 6 weeks.  Is been steadily getting worse.  He  states today on a daily basis he has pain to the right paraspinal muscles.  He also occasionally has right-sided sciatica that radiates down his posterior right leg into his right knee.  He also occasionally has left-sided sciatica that will also radiate from his left gluteus down to his left leg.  He denies any saddle anesthesia or bowel or bladder incontinence.  He denies any recent falls or injuries.  The back pain is just become progressively worse.  He denies any neurogenic claudication.At that time, my plan was:  Based on his symptoms, I am concerned about central spinal canal stenosis versus multiple herniated disc in the lumbar spine.  Begin by obtaining an x-ray of the lumbar spine.  Based upon the x-ray results, we will either try combination of muscle relaxers and gabapentin versus proceeding with an MRI to evaluate further.  Patient denies any symptoms of cauda equina syndrome.  09/25/20 IMPRESSION: 1. At L1-L2, degenerative disease with moderate bilateral foraminal stenosis. 2. At L4-L5, moderate left subarticular recess stenosis with disc contacting the descending left L5 nerve roots.  As shown above, MRI showed possible nerve impingement on the left L5 nerve root.  Offer the patient a referral to orthopedics for an epidural steroid injection however the patient declined that as his symptoms had improved.  Unfortunately he returns today stating that the pain in his left leg is keeping him awake at night.  Typically during the daytime, the pain will fade into the background and he is able to ignore it.  However at night usually within 10 minutes of lying down, he feels burning pain radiating from his back down his left leg all the way into his left foot.  The foot will ache and throb.  The throbbing and burning is on the dorsum and plantar aspect of the foot and radiates up the left calf. Past Medical History:  Diagnosis Date   Aortic aneurysm ([place])    Aortic insufficiency due to bicuspid  aortic valve    Aortic stenosis due to bicuspid aortic valve    Atrial flutter (HCC)    Bicuspid aortic valve    GERD (gastroesophageal reflux disease)    past hx- stopped alcohol and no more issues    NSVT (nonsustained ventricular tachycardia) (HCC)    Paroxysmal atrial fibrillation (H[facility])    PVC's (premature ventricular contractions)    RBBB    Past Surgical History:  Procedure Laterality Date   COLONOSCOPY     12 yrs ago [facility]- normal per pt    CYST EXCISION     left knee    CYST EXCISION     tongue   Current Outpatient Medications on File Prior to Visit  Medication Sig Dispense Refill   apixaban (ELIQUIS) 5 MG TABS tablet TAKE 1 TABLET BY MOUTH TWICE A DAY 180 tablet 1   atorvastatin (LIPITOR) 20 MG tablet Take 1 tablet (20 mg total) by mouth daily. 90 tablet 3   CHOLECALCIFEROL PO Take 1,000 Units by mouth daily.     Cyanocobalamin (VITAMIN B-12 CR) 1500 MCG TBCR Take 1 tablet by mouth daily.     GLUCOSAMINE HCL PO Take 2,000 mg by mouth daily.     lisinopril (ZESTRIL) 5 MG tablet TAKE 1 TABLET BY MOUTH EVERY DAY 90 tablet 3   metoprolol tartrate (LOPRESSOR) 25 MG tablet Take 0.5 tablets (12.5 mg total) by mouth every 6 (six) hours as needed (palpitations). 30 tablet 0   Omega-3 Fatty Acids (FISH OIL) 1000 MG CAPS Take 1,000 mg by mouth daily.      No current facility-administered medications on file prior to visit.   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  Tobacco Use   Smoking status: Former    Types: Pipe    Quit date: 1982    Years since quitting: 40.7   Smokeless tobacco: Never   Tobacco comments:    smoked a pipe at age 54 to 89  Vaping Use   Vaping Use: Never used  Substance and  Sexual Activity   Alcohol use: Not Currently   Drug use: Never   Sexual activity: Yes  Other Topics Concern   Not on file  Social History Narrative   Lives in [place]   Retired English as a second language teacher   Social Determinants of Health   Financial Resource Strain: Low Risk    Difficulty of Paying Living Expenses: Not very hard  Food Insecurity: Not on file  Transportation Needs: Not on file  Physical Activity: Not on file  Stress: Not on file  Social Connections: Not on file  Intimate Partner Violence: Not on  file   Family History  Problem Relation Age of Onset   Heart disease Mother    Hypertension Father    Stroke Father    COPD Sister    Colon polyps Sister    Hypertension Brother    COPD Brother    Colon cancer Neg Hx    Esophageal cancer Neg Hx    Rectal cancer Neg Hx    Stomach cancer Neg Hx       Review of Systems  All other systems reviewed and are negative.     Objective:   Physical Exam Vitals reviewed.  Constitutional:      General: He is not in acute distress.    Appearance: Normal appearance. He is normal weight. He is not ill-appearing, toxic-appearing or diaphoretic.  HENT:     Head: Normocephalic and atraumatic.     Nose: Rhinorrhea present.     Mouth/Throat:     Pharynx: No oropharyngeal exudate or posterior oropharyngeal erythema.  Eyes:     General: No scleral icterus.       Right eye: No discharge.        Left eye: No discharge.     Extraocular Movements: Extraocular movements intact.     Conjunctiva/sclera: Conjunctivae normal.     Pupils: Pupils are equal, round, and reactive to light.  Cardiovascular:     Rate and Rhythm: Normal rate and regular rhythm.     Pulses: Normal pulses.     Heart sounds: Murmur heard.    No friction rub. No gallop.  Pulmonary:     Effort: Pulmonary effort is normal. No respiratory distress.     Breath sounds: Normal breath sounds. No stridor. No wheezing, rhonchi or rales.  Chest:     Chest wall: No tenderness.   Abdominal:     General: Abdomen is flat. Bowel sounds are normal. There is no distension.     Palpations: Abdomen is soft. There is no mass.     Tenderness: There is no abdominal tenderness. There is no guarding or rebound.     Hernia: No hernia is present.  Musculoskeletal:     Lumbar back: Spasms and tenderness present. No bony tenderness. Decreased range of motion.     Right lower leg: No edema.  Skin:    General: Skin is warm.     Coloration: Skin is not jaundiced or pale.     Findings: No bruising, erythema, lesion or rash.  Neurological:     General: No focal deficit present.     Mental Status: He is alert and oriented to person, place, and time. Mental status is at baseline.     Cranial Nerves: No cranial nerve deficit.     Motor: No weakness.     Coordination: Coordination normal.     Gait: Gait normal.     Deep Tendon Reflexes: Reflexes normal.  Psychiatric:        Mood and Affect: Mood normal.        Behavior: Behavior normal.        Thought Content: Thought content normal.        Judgment: Judgment normal.          Assessment & Plan:  Chronic sciatica of left side We discussed options.  He would like to try gabapentin 300 mg p.o. nightly prior to her referral for an epidural steroid injection.  If symptoms do not improve, he will entertain the thought of having a steroid injection performed under the care of  interventional radiology.  Reassess in 2 to 3-week

## 2020-09-26 ENCOUNTER — Telehealth: Payer: Self-pay | Admitting: Pharmacist

## 2020-09-26 NOTE — Progress Notes (Addendum)
    Chronic Care Management Pharmacy Assistant   Name: Jerry Velazquez  MRN: [id number] DOB: [birth year]  Reason for Encounter: General Disease State Call   Conditions to be addressed/monitored: Tachycardia, HTN, Afib,  Dyslipidemia  Recent office visits:  09/25/20 Dr. Dennard Schaumann For side pain. STARTED Gabapentin 300 mg daily at bedtime.   Recent consult visits:  09/06/20 Cardiothoracic John Giovanni, PA-C. For thoracic aortic aneurysm No medication changes.   Hospital visits:  None since 06/15/20   Medications: Outpatient Encounter Medications as of 09/26/2020  Medication Sig   apixaban (ELIQUIS) 5 MG TABS tablet TAKE 1 TABLET BY MOUTH TWICE A DAY   atorvastatin (LIPITOR) 20 MG tablet Take 1 tablet (20 mg total) by mouth daily.   CHOLECALCIFEROL PO Take 1,000 Units by mouth daily.   Cyanocobalamin (VITAMIN B-12 CR) 1500 MCG TBCR Take 1 tablet by mouth daily.   gabapentin (NEURONTIN) 300 MG capsule Take 1 capsule (300 mg total) by mouth at bedtime.   GLUCOSAMINE HCL PO Take 2,000 mg by mouth daily.   lisinopril (ZESTRIL) 5 MG tablet TAKE 1 TABLET BY MOUTH EVERY DAY   metoprolol tartrate (LOPRESSOR) 25 MG tablet Take 0.5 tablets (12.5 mg total) by mouth every 6 (six) hours as needed (palpitations).   Omega-3 Fatty Acids (FISH OIL) 1000 MG CAPS Take 1,000 mg by mouth daily.    No facility-administered encounter medications on file as of 09/26/2020.   GEN CALL: Patient stated he recently started Gabapentin for his back pain and he stated he has not noticed any side effects.  He stated he will inform us on how the medication is working/helping his back pain. Patient stated no questions or concerns about his medications at this time. He stated he ensures that he eats a well rounded diet daily. He stated he is active daily as much as he can.  Care Gaps: Not on the list.   Star Rating Drugs: Lisinopril 5 mg 08/16/20 90 DS, Atorvastatin 20 mg 07/07/20 90 DS.   Follow-Up:Pharmacist  Review    Charlann Lange, RMA Clinical Pharmacist Assistant [phone]  10 minutes spent in review, coordination, and documentation.  Reviewed by: Beverly Milch, PharmD Clinical Pharmacist [phone]

## 2020-10-07 ENCOUNTER — Other Ambulatory Visit: Payer: Self-pay | Admitting: Cardiovascular Disease

## 2020-10-17 ENCOUNTER — Encounter: Payer: Self-pay | Admitting: Family Medicine

## 2020-10-17 MED ORDER — GABAPENTIN 300 MG PO CAPS: 300.0000 mg | ORAL_CAPSULE | Freq: Every day | ORAL | 3 refills | Status: DC

## 2020-10-20 ENCOUNTER — Other Ambulatory Visit: Payer: Self-pay | Admitting: Cardiovascular Disease

## 2020-10-25 ENCOUNTER — Other Ambulatory Visit: Payer: Self-pay

## 2020-10-25 ENCOUNTER — Ambulatory Visit: Payer: PPO | Admitting: Cardiovascular Disease

## 2020-10-25 ENCOUNTER — Encounter: Payer: Self-pay | Admitting: Cardiovascular Disease

## 2020-10-25 VITALS — BP 130/61 | HR 61 | Ht 73.0 in | Wt 194.0 lb

## 2020-10-25 DIAGNOSIS — I7121 Aneurysm of the ascending aorta, without rupture: Secondary | ICD-10-CM | POA: Diagnosis not present

## 2020-10-25 DIAGNOSIS — E785 Hyperlipidemia, unspecified: Secondary | ICD-10-CM

## 2020-10-25 DIAGNOSIS — I10 Essential (primary) hypertension: Secondary | ICD-10-CM

## 2020-10-25 DIAGNOSIS — Q231 Congenital insufficiency of aortic valve: Secondary | ICD-10-CM

## 2020-10-25 DIAGNOSIS — I480 Paroxysmal atrial fibrillation: Secondary | ICD-10-CM | POA: Diagnosis not present

## 2020-10-25 DIAGNOSIS — R0989 Other specified symptoms and signs involving the circulatory and respiratory systems: Secondary | ICD-10-CM | POA: Diagnosis not present

## 2020-10-25 NOTE — Patient Instructions (Signed)
Medication Instructions:  Your physician recommends that you continue on your current medications as directed. Please refer to the Current Medication list given to you today.  *If you need a refill on your cardiac medications before your next appointment, please call your pharmacy*   Testing/Procedures: Your physician has requested that you have a carotid duplex. This test is an ultrasound of the carotid arteries in your neck. It looks at blood flow through these arteries that supply the brain with blood. Allow one hour for this exam. There are no restrictions or special instructions. This procedure is done at [facility].   Your physician has requested that you have an echocardiogram. Echocardiography is a painless test that uses sound waves to create images of your heart. It provides your doctor with information about the size and shape of your heart and how well your heart's chambers and valves are working. This procedure takes approximately one hour. There are no restrictions for this procedure. To be done in Aug. 2023. This procedure is done at [address].    Follow-Up: At [facility], you and your health needs are our priority.  As part of our continuing mission to provide you with exceptional heart care, we have created designated Provider Care Teams.  These Care Teams include your primary Cardiologist (physician) and Advanced Practice Providers (APPs -  Physician Assistants and Nurse Practitioners) who all work together to provide you with the care you need, when you need it.  We recommend signing up for the patient portal called "MyChart".  Sign up information is provided on this After Visit Summary.  MyChart is used to connect with patients for Virtual Visits (Telemedicine).  Patients are able to view lab/test results, encounter notes, upcoming appointments, etc.  Non-urgent messages can be sent to your provider as well.   To learn more about what you can do with MyChart,  go to [email or web address].    Your next appointment:   12 month(s)  The format for your next appointment:   In Person  Provider:   Quay Burow, MD

## 2020-10-25 NOTE — Progress Notes (Signed)
10/25/2020 Jerry Velazquez   [birth year]  [id number]  Primary Physician Dennard Schaumann Cammie Mcgee, MD Primary Cardiologist: Lorretta Harp MD Lupe Carney, [place]  HPI:  Jerry Velazquez is a 75 y.o.  thin appearing married Caucasian male father of no children who is retired Nurse, mental health.  He was referred by [name], PA-C for cardiovascular evaluation because of aortic insufficiency.    I last  saw him in the office 08/31/2019.  He has no cardiac risk factors.  He was seen because of palpitations with a recent Holter monitor that showed PVCs and short atrial runs.  2D echo performed because of his soft murmur revealed a bicuspid aortic valve with mild stenosis and mild to moderate aortic insufficiency with normal LV size and function.  There was noted mild dilatation of the thoracic aorta.  He also was experiences a quivering sensation in his chest.  He has denied chest pain or shortness of breath in the past.   I referred him to Dr. [name] and Dr. [name] as well.  Ultimately he was begun on Eliquis oral anticoagulation.  A chest CTA was performed 10/31/2017 revealing a 4.9 cm ascending thoracic aortic aneurysm.  This will be followed on an semiannual basis.  He will ultimately need thoracic aortic repair as well as AVR.  It is possible that he would also benefit from a surgical maze procedure during the operation and removal of his left atrial appendage but I will leave this to Dr. [name] to decide.   Since I  saw him a year ago he continues to do well.  I he is completely asymptomatic.  He is very active.  Recent 2D echo performed 08/22/2020 revealed normal LV systolic function, grade 1 diastolic dysfunction, mild AI, moderate AAS (bicuspid aortic valve) with an ascending aorta measuring 46 mm.  Recent CTA performed 09/06/2020 revealed his thoracic aorta measured 48 mm, not at threshold for consideration for replacement.  He is followed by Dr. Vivi Martens office.   Current Meds  Medication  Sig   apixaban (ELIQUIS) 5 MG TABS tablet TAKE 1 TABLET BY MOUTH TWICE A DAY   atorvastatin (LIPITOR) 20 MG tablet TAKE 1 TABLET BY MOUTH EVERY DAY   CHOLECALCIFEROL PO Take 1,000 Units by mouth daily.   Cyanocobalamin (VITAMIN B-12 CR) 1500 MCG TBCR Take 1 tablet by mouth daily.   gabapentin (NEURONTIN) 300 MG capsule Take 1 capsule (300 mg total) by mouth at bedtime.   GLUCOSAMINE HCL PO Take 2,000 mg by mouth daily.   lisinopril (ZESTRIL) 5 MG tablet TAKE 1 TABLET BY MOUTH EVERY DAY   Omega-3 Fatty Acids (FISH OIL) 1000 MG CAPS Take 1,000 mg by mouth daily.      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  Tobacco Use   Smoking status: Former    Types: Pipe    Quit date: 1982    Years since quitting: 40.8   Smokeless tobacco: Never   Tobacco comments:    smoked a pipe at age 58 to 88  Vaping Use   Vaping Use: Never used  Substance and Sexual Activity   Alcohol use:  Not Currently   Drug use: Never   Sexual activity: Yes  Other Topics Concern   Not on file  Social History Narrative   Lives in [place]   Retired English as a second language teacher   Social Determinants of Health   Financial Resource Strain: Low Risk    Difficulty of Paying Living Expenses: Not very hard  Food Insecurity: Not on file  Transportation Needs: Not on file  Physical Activity: Not on file  Stress: Not on file  Social Connections: Not on file  Intimate Partner Violence: Not on file     Review of Systems: General: negative for chills, fever, night sweats or weight changes.  Cardiovascular: negative for chest pain, dyspnea on exertion, edema, orthopnea, palpitations, paroxysmal nocturnal dyspnea or shortness of breath Dermatological: negative for  rash Respiratory: negative for cough or wheezing Urologic: negative for hematuria Abdominal: negative for nausea, vomiting, diarrhea, bright red blood per rectum, melena, or hematemesis Neurologic: negative for visual changes, syncope, or dizziness All other systems reviewed and are otherwise negative except as noted above.    Blood pressure 130/61, pulse 61, height 6\' 1"  (1.854 m), weight 194 lb (88 kg), SpO2 97 %.  General appearance: alert and no distress Neck: no adenopathy, no JVD, supple, symmetrical, trachea midline, thyroid not enlarged, symmetric, no tenderness/mass/nodules, and bilateral carotid bruits versus transmitted murmur Lungs: clear to auscultation bilaterally Heart: 2/6 outflow tract murmur consistent with aortic stenosis Extremities: extremities normal, atraumatic, no cyanosis or edema Pulses: 2+ and symmetric Skin: Skin color, texture, turgor normal. No rashes or lesions Neurologic: Grossly normal  EKG sinus rhythm at 61 with incomplete right bundle branch block.  I personally reviewed this EKG.  ASSESSMENT AND PLAN:   Dyslipidemia History of dyslipidemia on statin therapy with lipid profile performed 05/25/2020 revealing total cholesterol of 131, LDL 62 and HDL 58.  Thoracic aortic aneurysm (HCC) History of thoracic aortic aneurysm measuring between 4.6 to 4.8 cm depending on modality of measurement (2D echo versus CT angio), followed by Dr. Vivi Martens office.  Bicuspid aortic valve History of bicuspid aortic valve with recent 2D echo performed 08/22/2020 showing mild AI, moderate AAS, normal LV systolic function with grade 1 diastolic dysfunction.  We will continue to follow this on an annual basis.  Essential hypertension History of essential hypertension blood pressure measured today at 130/61.  He is on metoprolol and lisinopril.  Paroxysmal atrial fibrillation (HCC) History of paroxysmal A. fib maintaining sinus rhythm on Eliquis oral  anticoagulation.     Lorretta Harp MD FACP,FACC,FAHA, [facility] 10/25/2020 9:58 AM

## 2020-10-25 NOTE — Assessment & Plan Note (Signed)
History of bicuspid aortic valve with recent 2D echo performed 08/22/2020 showing mild AI, moderate AAS, normal LV systolic function with grade 1 diastolic dysfunction.  We will continue to follow this on an annual basis.

## 2020-10-25 NOTE — Assessment & Plan Note (Signed)
History of essential hypertension blood pressure measured today at 130/61.  He is on metoprolol and lisinopril.

## 2020-10-25 NOTE — Assessment & Plan Note (Signed)
History of dyslipidemia on statin therapy with lipid profile performed 05/25/2020 revealing total cholesterol of 131, LDL 62 and HDL 58.

## 2020-10-25 NOTE — Assessment & Plan Note (Signed)
History of thoracic aortic aneurysm measuring between 4.6 to 4.8 cm depending on modality of measurement (2D echo versus CT angio), followed by Dr. Vivi Martens office.

## 2020-10-25 NOTE — Assessment & Plan Note (Signed)
History of paroxysmal A. fib maintaining sinus rhythm on Eliquis oral anticoagulation 

## 2020-10-26 ENCOUNTER — Ambulatory Visit (HOSPITAL_COMMUNITY)
Admission: RE | Admit: 2020-10-26 | Discharge: 2020-10-26 | Disposition: A | Discharge status: Home / Self Care | Payer: PPO | Source: Ambulatory Visit | Attending: Cardiology | Admitting: Cardiology

## 2020-10-26 DIAGNOSIS — R0989 Other specified symptoms and signs involving the circulatory and respiratory systems: Secondary | ICD-10-CM | POA: Insufficient documentation

## 2020-11-15 ENCOUNTER — Encounter: Payer: Self-pay | Admitting: Family Medicine

## 2020-11-15 MED ORDER — GABAPENTIN 300 MG PO CAPS: 300.0000 mg | ORAL_CAPSULE | Freq: Every day | ORAL | 3 refills | Status: DC

## 2020-12-14 NOTE — Progress Notes (Signed)
Chronic Care Management Pharmacy Note  12/15/2020 Name:  Jerry Velazquez MRN:  [id number] DOB:  29-Feb-1945  Recommendations/Changes made from today's visit: No changes made at today's visit  Plan: MRI of lumbar spine - then determine best course of action for back pain  Subjective: Jerry Velazquez is an 75 y.o. year old male who is a primary patient of Pickard, Cammie Mcgee, MD.  The CCM team was consulted for assistance with disease management and care coordination needs.    Engaged with patient face to face for follow up visit in response to provider referral for pharmacy case management and/or care coordination services.   Consent to Services:  The patient was given the following information about Chronic Care Management services today, agreed to services, and gave verbal consent: 1. CCM service includes personalized support from designated clinical staff supervised by the primary care provider, including individualized plan of care and coordination with other care providers 2. 24/7 contact phone numbers for assistance for urgent and routine care needs. 3. Service will only be billed when office clinical staff spend 20 minutes or more in a month to coordinate care. 4. Only one practitioner may furnish and bill the service in a calendar month. 5.The patient may stop CCM services at any time (effective at the end of the month) by phone call to the office staff. 6. The patient will be responsible for cost sharing (co-pay) of up to 20% of the service fee (after annual deductible is met). Patient agreed to services and consent obtained.  Patient Care Team: Susy Frizzle, MD as PCP - General (Family Medicine) Lorretta Harp, MD as PCP - Cardiology (Cardiology) [name], PA-C as Physician Assistant (Family Medicine) Gala Romney Cristopher Estimable, MD as Consulting Physician (Gastroenterology) [name], [facility] as Pharmacist (Pharmacist)  Recent office visits:  09/25/20 Dr. Dennard Schaumann For side pain.  STARTED Gabapentin 300 mg daily at bedtime.    Recent consult visits:  09/06/20 Cardiothoracic John Giovanni, PA-C. For thoracic aortic aneurysm No medication changes.    Hospital visits:  None since 06/15/20   Objective:  Lab Results  Component Value Date   CREATININE 0.69 (L) 05/25/2020   BUN 11 05/25/2020   GFRNONAA 93 05/25/2020   GFRAA 108 05/25/2020   NA 138 05/25/2020   K 5.0 05/25/2020   CALCIUM 9.6 05/25/2020   CO2 27 05/25/2020   GLUCOSE 81 05/25/2020    No results found for: HGBA1C, FRUCTOSAMINE, GFR, MICROALBUR  Last diabetic Eye exam: No results found for: HMDIABEYEEXA  Last diabetic Foot exam: No results found for: HMDIABFOOTEX   Lab Results  Component Value Date   CHOL 131 05/25/2020   HDL 58 05/25/2020   LDLCALC 62 05/25/2020   TRIG 37 05/25/2020   CHOLHDL 2.3 05/25/2020    Hepatic Function Latest Ref Rng & Units 05/25/2020 11/25/2019 09/13/2019  Total Protein 6.1 - 8.1 g/dL 6.8 7.1 6.8  Albumin 3.7 - 4.7 g/dL - - 4.0  AST 10 - 35 U/L 26 20 39  ALT 9 - 46 U/L 34 23 48(H)  Alk Phosphatase 48 - 121 IU/L - - 75  Total Bilirubin 0.2 - 1.2 mg/dL 0.7 0.7 0.5  Bilirubin, Direct 0.00 - 0.40 mg/dL - - 0.13    Lab Results  Component Value Date/Time   TSH 1.47 09/03/2017 09:34 AM    CBC Latest Ref Rng & Units 05/25/2020 11/25/2019 08/10/2018  WBC 3.8 - 10.8 Thousand/uL 4.6 6.5 5.5  Hemoglobin 13.2 - 17.1  g/dL 13.1(L) 13.2 14.3  Hematocrit 38.5 - 50.0 % 39.5 40.0 43.9  Platelets 140 - 400 Thousand/uL 189 291 218    No results found for: VD25OH  Clinical ASCVD: Yes  The 10-year ASCVD risk score (Arnett DK, et al., 2019) is: 26.4%   Values used to calculate the score:     Age: 79 years     Sex: Male     Is Non-Hispanic African American: No     Diabetic: No     Tobacco smoker: No     Systolic Blood Pressure: 277 mmHg     Is BP treated: Yes     HDL Cholesterol: 58 mg/dL     Total Cholesterol: 131 mg/dL    Depression screen [facility] 2/9 05/25/2020 11/25/2019  09/17/2017  Decreased Interest 0 0 0  Down, Depressed, Hopeless 0 0 0  PHQ - 2 Score 0 0 0     Social History   Tobacco Use  Smoking Status Former   Types: Pipe   Quit date: 1982   Years since quitting: 40.9  Smokeless Tobacco Never  Tobacco Comments   smoked a pipe at age 58 to 37   BP Readings from Last 3 Encounters:  10/25/20 130/61  09/25/20 140/68  09/06/20 (!) 150/73   Pulse Readings from Last 3 Encounters:  10/25/20 61  09/25/20 68  09/06/20 62   Wt Readings from Last 3 Encounters:  10/25/20 194 lb (88 kg)  09/25/20 188 lb (85.3 kg)  09/06/20 189 lb (85.7 kg)   BMI Readings from Last 3 Encounters:  10/25/20 25.60 kg/m  09/25/20 24.80 kg/m  09/06/20 24.94 kg/m    Assessment/Interventions: Review of patient past medical history, allergies, medications, health status, including review of consultants reports, laboratory and other test data, was performed as part of comprehensive evaluation and provision of chronic care management services.   SDOH:  (Social Determinants of Health) assessments and interventions performed: Yes   Financial Resource Strain: Low Risk    Difficulty of Paying Living Expenses: Not very hard    SDOH Screenings   Alcohol Screen: Low Risk    Last Alcohol Screening Score (AUDIT): 0  Depression (PHQ2-9): Low Risk    PHQ-2 Score: 0  Financial Resource Strain: Low Risk    Difficulty of Paying Living Expenses: Not very hard  Food Insecurity: Not on file  Housing: Not on file  Physical Activity: Not on file  Social Connections: Not on file  Stress: Not on file  Tobacco Use: Medium Risk   Smoking Tobacco Use: Former   Smokeless Tobacco Use: Never   Passive Exposure: Not on file  Transportation Needs: Not on file    [place]  No Known Allergies  Medications Reviewed Today     Reviewed by [name], [facility] (Pharmacist) on 12/15/20 at 1131  Med List Status: <None>   Medication Order Taking? Sig Documenting Provider  Last Dose Status Informant  apixaban (ELIQUIS) 5 MG TABS tablet [id number] Yes TAKE 1 TABLET BY MOUTH TWICE A DAY Lorretta Harp, MD Taking Active   atorvastatin (LIPITOR) 20 MG tablet [id number] Yes TAKE 1 TABLET BY MOUTH EVERY DAY Lorretta Harp, MD Taking Active   CHOLECALCIFEROL PO [id number] Yes Take 1,000 Units by mouth daily. [provider] Taking Active Self  Cyanocobalamin (VITAMIN B-12 CR) 1500 MCG TBCR [id number] Yes Take 1 tablet by mouth daily. [provider] Taking Active Self  gabapentin (NEURONTIN) 300 MG capsule [id number] Yes Take 1 capsule (  300 mg total) by mouth at bedtime. Susy Frizzle, MD Taking Active   GLUCOSAMINE HCL PO [id number] Yes Take 2,000 mg by mouth daily. [provider] Taking Active Self  lisinopril (ZESTRIL) 5 MG tablet [id number] Yes TAKE 1 TABLET BY MOUTH EVERY DAY Lorretta Harp, MD Taking Active   metoprolol tartrate (LOPRESSOR) 25 MG tablet [id number]  Take 0.5 tablets (12.5 mg total) by mouth every 6 (six) hours as needed (palpitations). Lorretta Harp, MD  Expired 10/10/20 2359   Omega-3 Fatty Acids (FISH OIL) 1000 MG CAPS [id number] Yes Take 1,000 mg by mouth daily.  [provider] Taking Active Self            Patient Active Problem List   Diagnosis Date Noted   Paroxysmal atrial fibrillation ([place]) 11/03/2018   Essential hypertension 03/13/2018   Thoracic aortic aneurysm 12/10/2017   Bicuspid aortic valve 12/10/2017   Dyslipidemia 09/18/2017   Paroxysmal supraventricular tachycardia ([place]) 09/18/2017   Former pipe smoker 09/18/2017   Aortic insufficiency 09/09/2017   Palpitations 09/04/2014    Immunization History  Administered Date(s) Administered   Fluad Quad(high Dose 65+) 09/15/2018   Moderna Sars-Covid-2 Vaccination 03/27/2019, 11/08/2019, 06/05/2020   Pneumococcal Polysaccharide-23 09/15/2018    Conditions to be addressed/monitored:  Tachycardia, HTN, Afib,  Dyslipidemia, Back  pain  Care Plan : General Pharmacy (Adult)  Updates made by [name], RPH since 12/15/2020 12:00 AM     Problem: Tachycardia, HTN, Afib,  Dyslipidemia   Priority: High  Onset Date: 06/15/2020     Long-Range Goal: Patient-Specific Goal   Start Date: 06/15/2020  Expected End Date: 12/15/2020  Recent Progress: On track  Priority: High  Note:   Current Barriers:  Unable to achieve control of back pain   Pharmacist Clinical Goal(s):  Patient will achieve control of pain as evidenced by pain level adhere to prescribed medication regimen as evidenced by fill dates contact provider office for questions/concerns as evidenced notation of same in electronic health record through collaboration with PharmD and provider.   Interventions: 1:1 collaboration with Susy Frizzle, MD regarding development and update of comprehensive plan of care as evidenced by provider attestation and co-signature Inter-disciplinary care team collaboration (see longitudinal plan of care) Comprehensive medication review performed; medication list updated in electronic medical record  Hypertension (BP goal <140/90) -Uncontrolled -Current treatment: Lisinopril 20m daily -Medications previously tried: none noted  -Current home readings: not checking -Current dietary habits: well rounded diet -Current exercise habits: walks for about 30 minutes daily on a regular basis -Denies hypotensive/hypertensive symptoms -Educated on BP goals and benefits of medications for prevention of heart attack, stroke and kidney damage; Exercise goal of 150 minutes per week; Importance of home blood pressure monitoring; Symptoms of hypotension and importance of maintaining adequate hydration; -Counseled to monitor BP at home if symptomatic, document, and provide log at future appointments -Recommended to continue current medication  Hyperlipidemia: (LDL goal < 70) -Controlled -Current treatment: Atorvastatin 29m daily -Medications previously tried: none noted  -Educated on Cholesterol goals;  Benefits of statin for ASCVD risk reduction; Importance of limiting foods high in cholesterol; Exercise goal of 150 minutes per week;  -Denies any adverse effects, adherent to medication, LDL is controlled -Recommended to continue current medication  Atrial Fibrillation/Tachycardia (Goal: prevent stroke and major bleeding) -Controlled  -Current treatment: Rate control: Metoprolol tartrate 2562mne-half tablet prn palpitations Anticoagulation: Eliquis 5mg34mD -Medications previously tried: none noted -Home BP and HR readings: does not check, controlled  in office  -Counseled on increased risk of stroke due to Afib and benefits of anticoagulation for stroke prevention; importance of adherence to anticoagulant exactly as prescribed; bleeding risk associated with Eliquis and importance of self-monitoring for signs/symptoms of bleeding; avoidance of NSAIDs due to increased bleeding risk with anticoagulants;  -He denies any abnormal bleeding or bruising -Takes metoprolol on prn basis, has experienced bradycardia in the past -Recommended to continue current medication Assessed patient finances. Currently Eliquis is affordable  Update 12/15/20 Only occasionally taking metoprolol still.  Eliquis copay is still affordable.  He has not had any chest pain/rapid HR.  BP has remained controlled in office.  Denies abnormal bleeding or bruising. No changes needed at this time, continue current meds.    Back Pain (Goal: Minimize symptom) -Controlled -Current treatment  Gabapentin 371m daily -Medications previously tried: none noted -Had MRI which showed bulging disc between L4-L5 which was potentially causing pain.  PCP gave him option of cortisone injection or meeting with neurosurgeon for surgery.  At that time he opted for conservative therapy.  Started gabapentin a few months ago.  States it has controlled his  pain.  If he takes it about 1 hour before bed it works the best.  Denies any adverse effects with med at this time. -Recommended to continue current medication He is satisfied at this time with pain level.    Patient Goals/Self-Care Activities Patient will:  - take medications as prescribed check blood pressure periodically, document, and provide at future appointments target a minimum of 150 minutes of moderate intensity exercise weekly  Follow Up Plan: The care management team will reach out to the patient again over the next 180 days.            Medication Assistance: None required.  Patient affirms current coverage meets needs.  Compliance/Adherence/Medication fill history:  [facility] Drugs: Atorvastatin 20 mg 90 DS 04/07/20 Lisinopril 5 mg 90 DS 05/21/20  Patient's preferred pharmacy is:  [id number] [id number]: [phone]: [phone] pill box? Yes Pt endorses 100% compliance  We discussed: Benefits of medication synchronization, packaging and delivery as well as enhanced pharmacist oversight with [employer]. Patient decided to: Continue current medication management strategy  Care Plan and Follow Up Patient Decision:  Patient agrees to Care Plan and Follow-up.  Plan: The care management team will reach out to the patient again over the next 180 days.  ChBeverly MilchPharmD Clinical Pharmacist [phone]

## 2020-12-15 ENCOUNTER — Ambulatory Visit (INDEPENDENT_AMBULATORY_CARE_PROVIDER_SITE_OTHER): Payer: PPO | Admitting: Pharmacist

## 2020-12-15 DIAGNOSIS — G8929 Other chronic pain: Secondary | ICD-10-CM

## 2020-12-15 DIAGNOSIS — I480 Paroxysmal atrial fibrillation: Secondary | ICD-10-CM

## 2020-12-15 DIAGNOSIS — M5441 Lumbago with sciatica, right side: Secondary | ICD-10-CM

## 2020-12-15 NOTE — Patient Instructions (Addendum)
Visit Information   Goals Addressed             This Visit's Progress    Track and Manage Heart Rate and Rhythm-Atrial Fibrillation   On track    Timeframe:  Long-Range Goal Priority:  High Start Date:   06/15/20                          Expected End Date:  12/15/20                     Follow Up Date 10/13/20   - make a plan to exercise regularly - make a plan to eat healthy - take medicine as prescribed    Why is this important?   Atrial fibrillation may have no symptoms. Sometimes the symptoms get worse or happen more often.  It is important to keep track of what your symptoms are and when they happen.  A change in symptoms is important to discuss with your doctor or nurse.  Being active and healthy eating will also help you manage your heart condition.     Notes:        Patient Care Plan: General Pharmacy (Adult)     Problem Identified: Tachycardia, HTN, Afib,  Dyslipidemia   Priority: High  Onset Date: 06/15/2020     Long-Range Goal: Patient-Specific Goal   Start Date: 06/15/2020  Expected End Date: 12/15/2020  Recent Progress: On track  Priority: High  Note:   Current Barriers:  Unable to achieve control of back pain   Pharmacist Clinical Goal(s):  Patient will achieve control of pain as evidenced by pain level adhere to prescribed medication regimen as evidenced by fill dates contact provider office for questions/concerns as evidenced notation of same in electronic health record through collaboration with PharmD and provider.   Interventions: 1:1 collaboration with Susy Frizzle, MD regarding development and update of comprehensive plan of care as evidenced by provider attestation and co-signature Inter-disciplinary care team collaboration (see longitudinal plan of care) Comprehensive medication review performed; medication list updated in electronic medical record  Hypertension (BP goal <140/90) -Uncontrolled -Current treatment: Lisinopril 5mg   daily -Medications previously tried: none noted  -Current home readings: not checking -Current dietary habits: well rounded diet -Current exercise habits: walks for about 30 minutes daily on a regular basis -Denies hypotensive/hypertensive symptoms -Educated on BP goals and benefits of medications for prevention of heart attack, stroke and kidney damage; Exercise goal of 150 minutes per week; Importance of home blood pressure monitoring; Symptoms of hypotension and importance of maintaining adequate hydration; -Counseled to monitor BP at home if symptomatic, document, and provide log at future appointments -Recommended to continue current medication  Hyperlipidemia: (LDL goal < 70) -Controlled -Current treatment: Atorvastatin 20mg  daily -Medications previously tried: none noted  -Educated on Cholesterol goals;  Benefits of statin for ASCVD risk reduction; Importance of limiting foods high in cholesterol; Exercise goal of 150 minutes per week;  -Denies any adverse effects, adherent to medication, LDL is controlled -Recommended to continue current medication  Atrial Fibrillation/Tachycardia (Goal: prevent stroke and major bleeding) -Controlled  -Current treatment: Rate control: Metoprolol tartrate 25mg  one-half tablet prn palpitations Anticoagulation: Eliquis 5mg  BID -Medications previously tried: none noted -Home BP and HR readings: does not check, controlled in office  -Counseled on increased risk of stroke due to Afib and benefits of anticoagulation for stroke prevention; importance of adherence to anticoagulant exactly as prescribed; bleeding risk associated with Eliquis  and importance of self-monitoring for signs/symptoms of bleeding; avoidance of NSAIDs due to increased bleeding risk with anticoagulants;  -He denies any abnormal bleeding or bruising -Takes metoprolol on prn basis, has experienced bradycardia in the past -Recommended to continue current medication Assessed  patient finances. Currently Eliquis is affordable  Update 12/15/20 Only occasionally taking metoprolol still.  Eliquis copay is still affordable.  He has not had any chest pain/rapid HR.  BP has remained controlled in office.  Denies abnormal bleeding or bruising. No changes needed at this time, continue current meds.    Back Pain (Goal: Minimize symptom) -Controlled -Current treatment  Gabapentin 300mg  daily -Medications previously tried: none noted -Had MRI which showed bulging disc between L4-L5 which was potentially causing pain.  PCP gave him option of cortisone injection or meeting with neurosurgeon for surgery.  At that time he opted for conservative therapy.  Started gabapentin a few months ago.  States it has controlled his pain.  If he takes it about 1 hour before bed it works the best.  Denies any adverse effects with med at this time. -Recommended to continue current medication He is satisfied at this time with pain level.    Patient Goals/Self-Care Activities Patient will:  - take medications as prescribed check blood pressure periodically, document, and provide at future appointments target a minimum of 150 minutes of moderate intensity exercise weekly  Follow Up Plan: The care management team will reach out to the patient again over the next 180 days.           Patient verbalizes understanding of instructions provided today and agrees to view in [place].  Telephone follow up appointment with pharmacy team member scheduled for: 6 months  [name], [place]

## 2021-01-11 ENCOUNTER — Ambulatory Visit (INDEPENDENT_AMBULATORY_CARE_PROVIDER_SITE_OTHER): Payer: PPO

## 2021-01-11 ENCOUNTER — Other Ambulatory Visit: Payer: Self-pay

## 2021-01-11 VITALS — Ht 73.0 in | Wt 186.0 lb

## 2021-01-11 DIAGNOSIS — Z0000 Encounter for general adult medical examination without abnormal findings: Secondary | ICD-10-CM

## 2021-01-11 NOTE — Patient Instructions (Signed)
[name] , Thank you for taking time to come for your Medicare Wellness Visit. I appreciate your ongoing commitment to your health goals. Please review the following plan we discussed and let me know if I can assist you in the future.   Screening recommendations/referrals: Colonoscopy: Cologuard done 12/20/2019 Repeat due in 3 years.  Recommended yearly ophthalmology/optometry visit for glaucoma screening and checkup Recommended yearly dental visit for hygiene and checkup  Vaccinations: Influenza vaccine: Done 10/23/2020. Repeat annually  Pneumococcal vaccine: Done 09/15/2018. Second dose due. Tdap vaccine: Due. Repeat in 10 years  Shingles vaccine: Shingrix discussed. Please contact your pharmacy for coverage information.     Covid-19: Done 03/27/2019, 11/08/2019, 06/05/2020 and 10/23/2020.  Advanced directives: Copy in chart.  Conditions/risks identified: Keep up the good work!!  Next appointment: Follow up in one year for your annual wellness visit. 01/17/2022 @ 11:15 am.  Preventive Care 65 Years and Older, Male  Preventive care refers to lifestyle choices and visits with your health care provider that can promote health and wellness. What does preventive care include? A yearly physical exam. This is also called an annual well check. Dental exams once or twice a year. Routine eye exams. Ask your health care provider how often you should have your eyes checked. Personal lifestyle choices, including: Daily care of your teeth and gums. Regular physical activity. Eating a healthy diet. Avoiding tobacco and drug use. Limiting alcohol use. Practicing safe sex. Taking low doses of aspirin every day. Taking vitamin and mineral supplements as recommended by your health care provider. What happens during an annual well check? The services and screenings done by your health care provider during your annual well check will depend on your age, overall health, lifestyle risk factors, and  family history of disease. Counseling  Your health care provider may ask you questions about your: Alcohol use. Tobacco use. Drug use. Emotional well-being. Home and relationship well-being. Sexual activity. Eating habits. History of falls. Memory and ability to understand (cognition). Work and work Statistician. Screening  You may have the following tests or measurements: Height, weight, and BMI. Blood pressure. Lipid and cholesterol levels. These may be checked every 5 years, or more frequently if you are over 56 years old. Skin check. Lung cancer screening. You may have this screening every year starting at age 52 if you have a 30-pack-year history of smoking and currently smoke or have quit within the past 15 years. Fecal occult blood test (FOBT) of the stool. You may have this test every year starting at age 74. Flexible sigmoidoscopy or colonoscopy. You may have a sigmoidoscopy every 5 years or a colonoscopy every 10 years starting at age 8. Prostate cancer screening. Recommendations will vary depending on your family history and other risks. Hepatitis C blood test. Hepatitis B blood test. Sexually transmitted disease (STD) testing. Diabetes screening. This is done by checking your blood sugar (glucose) after you have not eaten for a while (fasting). You may have this done every 1-3 years. Abdominal aortic aneurysm (AAA) screening. You may need this if you are a current or former smoker. Osteoporosis. You may be screened starting at age 61 if you are at high risk. Talk with your health care provider about your test results, treatment options, and if necessary, the need for more tests. Vaccines  Your health care provider may recommend certain vaccines, such as: Influenza vaccine. This is recommended every year. Tetanus, diphtheria, and acellular pertussis (Tdap, Td) vaccine. You may need a Td booster every  10 years. Zoster vaccine. You may need this after age 37. Pneumococcal  13-valent conjugate (PCV13) vaccine. One dose is recommended after age 24. Pneumococcal polysaccharide (PPSV23) vaccine. One dose is recommended after age 72. Talk to your health care provider about which screenings and vaccines you need and how often you need them. This information is not intended to replace advice given to you by your health care provider. Make sure you discuss any questions you have with your health care provider. Document Released: 01/27/2015 Document Revised: 09/20/2015 Document Reviewed: 11/01/2014 Elsevier Interactive Patient Education  2017 [employer] Prevention in the Home Falls can cause injuries. They can happen to people of all ages. There are many things you can do to make your home safe and to help prevent falls. What can I do on the outside of my home? Regularly fix the edges of walkways and driveways and fix any cracks. Remove anything that might make you trip as you walk through a door, such as a raised step or threshold. Trim any bushes or trees on the path to your home. Use bright outdoor lighting. Clear any walking paths of anything that might make someone trip, such as rocks or tools. Regularly check to see if handrails are loose or broken. Make sure that both sides of any steps have handrails. Any raised decks and porches should have guardrails on the edges. Have any leaves, snow, or ice cleared regularly. Use sand or salt on walking paths during winter. Clean up any spills in your garage right away. This includes oil or grease spills. What can I do in the bathroom? Use night lights. Install grab bars by the toilet and in the tub and shower. Do not use towel bars as grab bars. Use non-skid mats or decals in the tub or shower. If you need to sit down in the shower, use a plastic, non-slip stool. Keep the floor dry. Clean up any water that spills on the floor as soon as it happens. Remove soap buildup in the tub or shower regularly. Attach  bath mats securely with double-sided non-slip rug tape. Do not have throw rugs and other things on the floor that can make you trip. What can I do in the bedroom? Use night lights. Make sure that you have a light by your bed that is easy to reach. Do not use any sheets or blankets that are too big for your bed. They should not hang down onto the floor. Have a firm chair that has side arms. You can use this for support while you get dressed. Do not have throw rugs and other things on the floor that can make you trip. What can I do in the kitchen? Clean up any spills right away. Avoid walking on wet floors. Keep items that you use a lot in easy-to-reach places. If you need to reach something above you, use a strong step stool that has a grab bar. Keep electrical cords out of the way. Do not use floor polish or wax that makes floors slippery. If you must use wax, use non-skid floor wax. Do not have throw rugs and other things on the floor that can make you trip. What can I do with my stairs? Do not leave any items on the stairs. Make sure that there are handrails on both sides of the stairs and use them. Fix handrails that are broken or loose. Make sure that handrails are as long as the stairways. Check any carpeting to make  sure that it is firmly attached to the stairs. Fix any carpet that is loose or worn. Avoid having throw rugs at the top or bottom of the stairs. If you do have throw rugs, attach them to the floor with carpet tape. Make sure that you have a light switch at the top of the stairs and the bottom of the stairs. If you do not have them, ask someone to add them for you. What else can I do to help prevent falls? Wear shoes that: Do not have high heels. Have rubber bottoms. Are comfortable and fit you well. Are closed at the toe. Do not wear sandals. If you use a stepladder: Make sure that it is fully opened. Do not climb a closed stepladder. Make sure that both sides of the  stepladder are locked into place. Ask someone to hold it for you, if possible. Clearly mark and make sure that you can see: Any grab bars or handrails. First and last steps. Where the edge of each step is. Use tools that help you move around (mobility aids) if they are needed. These include: Canes. Walkers. Scooters. Crutches. Turn on the lights when you go into a dark area. Replace any light bulbs as soon as they burn out. Set up your furniture so you have a clear path. Avoid moving your furniture around. If any of your floors are uneven, fix them. If there are any pets around you, be aware of where they are. Review your medicines with your doctor. Some medicines can make you feel dizzy. This can increase your chance of falling. Ask your doctor what other things that you can do to help prevent falls. This information is not intended to replace advice given to you by your health care provider. Make sure you discuss any questions you have with your health care provider. Document Released: 10/27/2008 Document Revised: 06/08/2015 Document Reviewed: 02/04/2014 Elsevier Interactive Patient Education  2017 [employer].

## 2021-01-11 NOTE — Progress Notes (Signed)
Subjective:   Jerry Velazquez is a 75 y.o. male who presents for Medicare Annual/Subsequent preventive examination. Virtual Visit via Telephone Note  I connected with  Jerry Velazquez on 01/11/21 at 11:15 AM EST by telephone and verified that I am speaking with the correct person using two identifiers.  Location: Patient: Home Provider: [facility] Persons participating in the virtual visit: patient/Nurse Health Advisor   I discussed the limitations, risks, security and privacy concerns of performing an evaluation and management service by telephone and the availability of in person appointments. The patient expressed understanding and agreed to proceed.  Interactive audio and video telecommunications were attempted between this nurse and patient, however failed, due to patient having technical difficulties OR patient did not have access to video capability.  We continued and completed visit with audio only.  Some vital signs may be absent or patient reported.   Chriss Driver, LPN  Review of Systems     Cardiac Risk Factors include: advanced age (>12men, >54 women);hypertension;dyslipidemia;male gender   PHONE VISIT. PT AT HOME. NURSE AT [facility]. Objective:    Today's Vitals   01/11/21 1115  Weight: 186 lb (84.4 kg)  Height: 6\' 1"  (1.854 m)   Body mass index is 24.54 kg/m.  Advanced Directives 01/11/2021 11/25/2019 09/17/2017  Does Patient Have a Medical Advance Directive? Yes Yes No  Type of Paramedic of [place];Living will [place];Out of facility DNR (pink MOST or yellow form);Living will -  Copy of [place] in Chart? Yes - validated most recent copy scanned in chart (See row information) - -  Would patient like information on creating a medical advance directive? - - Yes (MAU/Ambulatory/Procedural Areas - Information given)    Current Medications (verified) Outpatient Encounter Medications as of 01/11/2021   Medication Sig   apixaban (ELIQUIS) 5 MG TABS tablet TAKE 1 TABLET BY MOUTH TWICE A DAY   atorvastatin (LIPITOR) 20 MG tablet TAKE 1 TABLET BY MOUTH EVERY DAY   gabapentin (NEURONTIN) 300 MG capsule Take 1 capsule (300 mg total) by mouth at bedtime.   lisinopril (ZESTRIL) 5 MG tablet TAKE 1 TABLET BY MOUTH EVERY DAY   metoprolol tartrate (LOPRESSOR) 25 MG tablet Take 0.5 tablets (12.5 mg total) by mouth every 6 (six) hours as needed (palpitations).   PFIZER COVID-19 VAC BIVALENT injection    CHOLECALCIFEROL PO Take 1,000 Units by mouth daily. (Patient not taking: Reported on 01/11/2021)   Cyanocobalamin (VITAMIN B-12 CR) 1500 MCG TBCR Take 1 tablet by mouth daily. (Patient not taking: Reported on 01/11/2021)   GLUCOSAMINE HCL PO Take 2,000 mg by mouth daily. (Patient not taking: Reported on 01/11/2021)   Omega-3 Fatty Acids (FISH OIL) 1000 MG CAPS Take 1,000 mg by mouth daily.  (Patient not taking: Reported on 01/11/2021)   No facility-administered encounter medications on file as of 01/11/2021.    Allergies (verified) Patient has no known allergies.   History: Past Medical History:  Diagnosis Date   Aortic aneurysm ([place])    Aortic insufficiency due to bicuspid aortic valve    Aortic stenosis due to bicuspid aortic valve    Atrial flutter ([facility])    Bicuspid aortic valve    GERD (gastroesophageal reflux disease)    past hx- stopped alcohol and no more issues    NSVT (nonsustained ventricular tachycardia)    Paroxysmal atrial fibrillation ([facility])    PVC's (premature ventricular contractions)    RBBB    Past Surgical  History:  Procedure Laterality Date   COLONOSCOPY     12 yrs ago [facility]- normal per pt    CYST EXCISION     left knee    CYST EXCISION     tongue   Family History  Problem Relation Age of Onset   Heart disease Mother    Hypertension Father    Stroke Father    COPD Sister    Colon polyps Sister    Hypertension Brother    COPD Brother    Colon cancer Neg Hx     Esophageal cancer Neg Hx    Rectal cancer Neg Hx    Stomach cancer Neg Hx    Social History   Socioeconomic History   Marital status: Married    Spouse name: [name]   Number of children: 0   Years of education: Not on file   Highest education level: Not on file  Occupational History   Occupation: retired    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   Social Determinants of Health   Financial Resource Strain: Low Risk    Difficulty of Paying Living Expenses: Not hard at all  Food Insecurity: No Food Insecurity   Worried About Charity fundraiser in the Last Year: Never true   [place] in the Last Year: Never true  Transportation Needs: No Transportation Needs   Lack of Transportation (Medical): No   Lack of Transportation (Non-Medical): No  Physical Activity: Sufficiently Active   Days of Exercise per Week: 5 days   Minutes of Exercise per Session: 30 min  Stress: No Stress Concern Present   Feeling of Stress : Not at all  Social Connections: Socially Integrated   Frequency of Communication with Friends and Family: More than three times a week   Frequency of Social Gatherings with Friends and Family: More than three times a week   Attends Religious Services: More than 4 times per year   Active Member of [employer] or Organizations: Yes   Attends Music therapist: More than 4 times per year   Marital Status: Married    Tobacco Counseling Counseling given: Not Answered Tobacco comments: smoked a pipe at  age 74 to 43   Clinical Intake:  Pre-visit preparation completed: Yes  Pain : No/denies pain     BMI - recorded: 24.54 Nutritional Status: BMI of 19-24  Normal Nutritional Risks: None Diabetes: No  How often do you need to have someone help you when you read instructions, pamphlets, or other written materials from your doctor or pharmacy?: 1 - Never  Diabetic?No  Interpreter Needed?: No  Information entered by :: MJ Narjis Mira, LPN   Activities of Daily Living In your present state of health, do you have any difficulty performing the following activities: 01/11/2021  Hearing? N  Vision? N  Difficulty concentrating or making decisions? N  Walking  or climbing stairs? N  Dressing or bathing? N  Doing errands, shopping? N  Preparing Food and eating ? N  Using the Toilet? N  In the past six months, have you accidently leaked urine? N  Do you have problems with loss of bowel control? N  Managing your Medications? N  Managing your Finances? N  Housekeeping or managing your Housekeeping? N  Some recent data might be hidden    Patient Care Team: Susy Frizzle, MD as PCP - General (Family Medicine) Lorretta Harp, MD as PCP - Cardiology (Cardiology) [name], PA-C as Physician Assistant (Family Medicine) Gala Romney Cristopher Estimable, MD as Consulting Physician (Gastroenterology) [name], [facility] as Pharmacist (Pharmacist)  Indicate any recent Medical Services you may have received from other than [facility] providers in the past year (date may be approximate).     Assessment:   This is a routine wellness examination for Juwan.  Hearing/Vision screen Hearing Screening - Comments:: Some hearing issue to L ear. Vision Screening - Comments:: Glasses. Dr. Jabier Mutton. 2022.  Dietary issues and exercise activities discussed: Current Exercise Habits: Home exercise routine, Type of exercise: walking, Time (Minutes): 30, Frequency (Times/Week): 5, Weekly Exercise (Minutes/Week): 150,  Intensity: Mild, Exercise limited by: cardiac condition(s)   Goals Addressed             This Visit's Progress    DIET - REDUCE FAT INTAKE   On track    Track and Manage Heart Rate and Rhythm-Atrial Fibrillation   On track    Timeframe:  Long-Range Goal Priority:  High Start Date:   06/15/20                          Expected End Date:  12/15/20                     Follow Up Date 10/13/20   - make a plan to exercise regularly - make a plan to eat healthy - take medicine as prescribed    Why is this important?   Atrial fibrillation may have no symptoms. Sometimes the symptoms get worse or happen more often.  It is important to keep track of what your symptoms are and when they happen.  A change in symptoms is important to discuss with your doctor or nurse.  Being active and healthy eating will also help you manage your heart condition.     Notes:        Depression Screen PHQ 2/9 Scores 01/11/2021 05/25/2020 11/25/2019 09/17/2017 09/03/2017  PHQ - 2 Score 0 0 0 0 0    Fall Risk Fall Risk  01/11/2021 05/25/2020 11/25/2019 09/17/2017 09/03/2017  Falls in the past year? 0 0 0 No No  Number falls in past yr: 0 - 0 - -  Injury with Fall? 0 - 0 - -  Risk for fall due to : No Fall Risks No Fall Risks - - -  Follow up Falls prevention discussed Falls evaluation completed - - -    FALL RISK PREVENTION PERTAINING TO THE HOME:  Any stairs in or around the home? Yes  If so, are there any without handrails? No  Home free of loose throw rugs in walkways, pet beds, electrical cords, etc? Yes  Adequate lighting in your home to reduce risk of falls? Yes   ASSISTIVE DEVICES UTILIZED TO PREVENT FALLS:  Life alert? No  Use of a cane, walker or w/c? No  Grab bars in the bathroom? No  Shower chair or bench in shower? No  Elevated toilet seat or a handicapped toilet? No   TIMED UP AND GO:  Was the test performed? No .  Phone visit.  Cognitive Function: Normal cognitive status assessed  by direct observation by this Nurse Health Advisor. No abnormalities found.       6CIT Screen 11/25/2019  What Year? 0 points  What month? 0 points  What time? 0 points  Count back from 20 0 points  Months in reverse 0 points  Repeat phrase 0 points  Total Score 0    Immunizations Immunization History  Administered Date(s) Administered   Fluad Quad(high Dose 65+) 09/15/2018   Influenza-Unspecified 10/23/2020   Moderna Sars-Covid-2 Vaccination 03/27/2019, 11/08/2019, 06/05/2020   Pfizer Covid-19 Vaccine Bivalent Booster 45yrs & up 10/23/2020   Pneumococcal Polysaccharide-23 09/15/2018   Zoster Recombinat (Shingrix) 07/24/2020    TDAP status: Due, Education has been provided regarding the importance of this vaccine. Advised may receive this vaccine at local pharmacy or Health Dept. Aware to provide a copy of the vaccination record if obtained from local pharmacy or Health Dept. Verbalized acceptance and understanding.  Flu Vaccine status: Up to date  Pneumococcal vaccine status: Due, Education has been provided regarding the importance of this vaccine. Advised may receive this vaccine at local pharmacy or Health Dept. Aware to provide a copy of the vaccination record if obtained from local pharmacy or Health Dept. Verbalized acceptance and understanding.  Covid-19 vaccine status: Completed vaccines  Qualifies for Shingles Vaccine? Yes   Zostavax completed Yes   Shingrix Completed?: No.    Education has been provided regarding the importance of this vaccine. Patient has been advised to call insurance company to determine out of pocket expense if they have not yet received this vaccine. Advised may also receive vaccine at local pharmacy or Health Dept. Verbalized acceptance and understanding.  Screening Tests Health Maintenance  Topic Date Due   TETANUS/TDAP  Never done   Pneumonia Vaccine 24+ Years old (2 - PCV) 09/15/2019   Zoster Vaccines- Shingrix (2 of 2) 09/18/2020    INFLUENZA VACCINE  Completed   COVID-19 Vaccine  Completed   Hepatitis C Screening  Completed   HPV VACCINES  Aged Out   Fecal DNA (Cologuard)  Discontinued    Health Maintenance  Health Maintenance Due  Topic Date Due   TETANUS/TDAP  Never done   Pneumonia Vaccine 29+ Years old (2 - PCV) 09/15/2019   Zoster Vaccines- Shingrix (2 of 2) 09/18/2020    Colorectal cancer screening: Type of screening: Cologuard. Completed 12/20/2019. Repeat every 3 years  Lung Cancer Screening: (Low Dose CT Chest recommended if Age 42-80 years, 30 pack-year currently smoking OR have quit w/in 15years.) does not qualify.     Additional Screening:  Hepatitis C Screening: does qualify; Completed 09/03/2017  Vision Screening: Recommended annual ophthalmology exams for early detection of glaucoma and other disorders of the eye. Is the patient up to date with their annual eye exam?  Yes  Who is the provider or what is the name of the office in which the patient attends annual eye exams? Dr. Jabier Mutton If pt is not established with a provider, would they like to be referred to a provider to establish care? No .   Dental Screening: Recommended annual dental exams for proper oral hygiene  Community Resource Referral / Chronic Care Management: CRR required this visit?  No   CCM required this visit?  No      Plan:     I have personally reviewed and noted the following in the patients chart:   Medical and social history Use of alcohol, tobacco or illicit drugs  Current medications and supplements including opioid prescriptions. Patient is not currently taking opioid prescriptions. Functional ability and status Nutritional status Physical activity Advanced directives List of other physicians Hospitalizations, surgeries, and ER visits in previous 12 months Vitals Screenings to include cognitive, depression, and falls Referrals and appointments  In addition, I have reviewed and discussed with patient  certain preventive protocols, quality metrics, and best practice recommendations. A written personalized care plan for preventive services as well as general preventive health recommendations were provided to patient.     Chriss Driver, LPN   [id number]   Nurse Notes: Pt is up to date on health maintenance. Discussed 2nd pneumococcal and shingrix vaccines and how to obtain.

## 2021-01-13 DIAGNOSIS — I480 Paroxysmal atrial fibrillation: Secondary | ICD-10-CM

## 2021-02-16 ENCOUNTER — Other Ambulatory Visit: Payer: Self-pay | Admitting: Cardiovascular Disease

## 2021-02-16 NOTE — Telephone Encounter (Signed)
Prescription refill request for Eliquis received. Indication:Afib Last office visit:10/22 Scr:0.6 Age: 76 Weight:84.4 kg  Prescription refilled

## 2021-04-03 DIAGNOSIS — L570 Actinic keratosis: Secondary | ICD-10-CM | POA: Diagnosis not present

## 2021-04-03 DIAGNOSIS — C4441 Basal cell carcinoma of skin of scalp and neck: Secondary | ICD-10-CM | POA: Diagnosis not present

## 2021-04-03 DIAGNOSIS — D485 Neoplasm of uncertain behavior of skin: Secondary | ICD-10-CM | POA: Diagnosis not present

## 2021-04-03 DIAGNOSIS — L298 Other pruritus: Secondary | ICD-10-CM | POA: Diagnosis not present

## 2021-04-03 DIAGNOSIS — D225 Melanocytic nevi of trunk: Secondary | ICD-10-CM | POA: Diagnosis not present

## 2021-04-03 DIAGNOSIS — L814 Other melanin hyperpigmentation: Secondary | ICD-10-CM | POA: Diagnosis not present

## 2021-04-03 DIAGNOSIS — C44329 Squamous cell carcinoma of skin of other parts of face: Secondary | ICD-10-CM | POA: Diagnosis not present

## 2021-04-03 DIAGNOSIS — L989 Disorder of the skin and subcutaneous tissue, unspecified: Secondary | ICD-10-CM | POA: Diagnosis not present

## 2021-04-03 DIAGNOSIS — L821 Other seborrheic keratosis: Secondary | ICD-10-CM | POA: Diagnosis not present

## 2021-04-19 DIAGNOSIS — L570 Actinic keratosis: Secondary | ICD-10-CM | POA: Diagnosis not present

## 2021-04-19 DIAGNOSIS — C4441 Basal cell carcinoma of skin of scalp and neck: Secondary | ICD-10-CM | POA: Diagnosis not present

## 2021-04-19 DIAGNOSIS — D049 Carcinoma in situ of skin, unspecified: Secondary | ICD-10-CM | POA: Diagnosis not present

## 2021-05-01 DIAGNOSIS — D0359 Melanoma in situ of other part of trunk: Secondary | ICD-10-CM | POA: Diagnosis not present

## 2021-05-01 DIAGNOSIS — L905 Scar conditions and fibrosis of skin: Secondary | ICD-10-CM | POA: Diagnosis not present

## 2021-05-04 ENCOUNTER — Encounter: Payer: Self-pay | Admitting: Family Medicine

## 2021-05-07 ENCOUNTER — Other Ambulatory Visit: Payer: Self-pay | Admitting: Family Medicine

## 2021-05-07 ENCOUNTER — Telehealth: Payer: Self-pay | Admitting: Pharmacist

## 2021-05-07 MED ORDER — CYCLOBENZAPRINE HCL 10 MG PO TABS: 10.0000 mg | ORAL_TABLET | Freq: Three times a day (TID) | ORAL | 0 refills | Status: AC | PRN

## 2021-05-07 NOTE — Progress Notes (Signed)
    Chronic Care Management Pharmacy Assistant   Name: Jerry Velazquez  MRN: [id number] DOB: [birth year]   Reason for Encounter: General Adherence Call    Recent office visits:  None  Recent consult visits:  None  Hospital visits:  None in previous 6 months  Medications: Outpatient Encounter Medications as of 05/07/2021  Medication Sig Note   atorvastatin (LIPITOR) 20 MG tablet TAKE 1 TABLET BY MOUTH EVERY DAY    CHOLECALCIFEROL PO Take 1,000 Units by mouth daily. (Patient not taking: Reported on 01/11/2021)    Cyanocobalamin (VITAMIN B-12 CR) 1500 MCG TBCR Take 1 tablet by mouth daily. (Patient not taking: Reported on 01/11/2021)    ELIQUIS 5 MG TABS tablet TAKE 1 TABLET BY MOUTH TWICE A DAY    gabapentin (NEURONTIN) 300 MG capsule Take 1 capsule (300 mg total) by mouth at bedtime.    GLUCOSAMINE HCL PO Take 2,000 mg by mouth daily. (Patient not taking: Reported on 01/11/2021)    lisinopril (ZESTRIL) 5 MG tablet TAKE 1 TABLET BY MOUTH EVERY DAY    metoprolol tartrate (LOPRESSOR) 25 MG tablet Take 0.5 tablets (12.5 mg total) by mouth every 6 (six) hours as needed (palpitations). 01/11/2021: Pt states he only takes as needed due to palpitations.    Omega-3 Fatty Acids (FISH OIL) 1000 MG CAPS Take 1,000 mg by mouth daily.  (Patient not taking: Reported on 01/11/2021)    PFIZER COVID-19 VAC BIVALENT injection     No facility-administered encounter medications on file as of 05/07/2021.   Patient Questions: Have you had any problems recently with your health?  Have you had any problems with your pharmacy?  What issues or side effects are you having with your medications?  What would you like me to pass along to Leata Mouse, CPP for him to help you with?   What can we do to take care of you better?  **Unsuccessful attempt at reaching patient to complete this call.**  Care Gaps: Medicare Annual Wellness: Completed 01/11/2021 Hemoglobin A1C: none Colonoscopy: Aged out  Future  Appointments  Date Time Provider [place]  06/21/2021 12:30 PM [facility]-CCM PHARMACIST [facility]  08/20/2021  9:30 AM [facility]  01/17/2022 11:15 AM [facility]-NURSE HEALTH ADVISOR [facility]   Star Rating Drugs: Atorvastatin 20 mg last filled 02/28/2021 90 DS Lisinopril 5 mg last filled 04/20/2021 90 DS  April D Calhoun, [place] Pharmacist Assistant [phone]

## 2021-05-08 NOTE — Telephone Encounter (Signed)
Dr. Dennard Schaumann has addressed his concerns. Nothing at this time.  ?

## 2021-05-25 ENCOUNTER — Ambulatory Visit: Payer: Self-pay | Admitting: Family Medicine

## 2021-06-18 ENCOUNTER — Encounter: Payer: Self-pay | Admitting: Family Medicine

## 2021-06-18 NOTE — Progress Notes (Unsigned)
Chronic Care Management Pharmacy Note  06/18/2021 Name:  Jerry Velazquez MRN:  [id number] DOB:  05-16-1945  Recommendations/Changes made from today's visit: ***  Plan: MRI of lumbar spine - then determine best course of action for back pain  Subjective: Jerry Velazquez is an 76 y.o. year old male who is a primary patient of Pickard, Cammie Mcgee, MD.  The CCM team was consulted for assistance with disease management and care coordination needs.    Engaged with patient face to face for follow up visit in response to provider referral for pharmacy case management and/or care coordination services.   Consent to Services:  The patient was given the following information about Chronic Care Management services today, agreed to services, and gave verbal consent: 1. CCM service includes personalized support from designated clinical staff supervised by the primary care provider, including individualized plan of care and coordination with other care providers 2. 24/7 contact phone numbers for assistance for urgent and routine care needs. 3. Service will only be billed when office clinical staff spend 20 minutes or more in a month to coordinate care. 4. Only one practitioner may furnish and bill the service in a calendar month. 5.The patient may stop CCM services at any time (effective at the end of the month) by phone call to the office staff. 6. The patient will be responsible for cost sharing (co-pay) of up to 20% of the service fee (after annual deductible is met). Patient agreed to services and consent obtained.  Patient Care Team: Susy Frizzle, MD as PCP - General (Family Medicine) Lorretta Harp, MD as PCP - Cardiology (Cardiology) [name], PA-C as Physician Assistant (Family Medicine) Gala Romney Cristopher Estimable, MD as Consulting Physician (Gastroenterology) [name], [facility] as Pharmacist (Pharmacist)  Recent office visits:  05/04/21 Dennard Schaumann) - called in [place] for muscle pain.  Has  history of lower back pain and tried gabapentin and prednisone in the past.   Recent consult visits:  None   Hospital visits:  None in previous 6 months  Objective:  Lab Results  Component Value Date   CREATININE 0.69 (L) 05/25/2020   BUN 11 05/25/2020   GFRNONAA 93 05/25/2020   GFRAA 108 05/25/2020   NA 138 05/25/2020   K 5.0 05/25/2020   CALCIUM 9.6 05/25/2020   CO2 27 05/25/2020   GLUCOSE 81 05/25/2020    No results found for: HGBA1C, FRUCTOSAMINE, GFR, MICROALBUR  Last diabetic Eye exam: No results found for: HMDIABEYEEXA  Last diabetic Foot exam: No results found for: HMDIABFOOTEX   Lab Results  Component Value Date   CHOL 131 05/25/2020   HDL 58 05/25/2020   LDLCALC 62 05/25/2020   TRIG 37 05/25/2020   CHOLHDL 2.3 05/25/2020       Latest Ref Rng & Units 05/25/2020    8:14 AM 11/25/2019    9:59 AM 09/13/2019    9:40 AM  Hepatic Function  Total Protein 6.1 - 8.1 g/dL 6.8   7.1   6.8    Albumin 3.7 - 4.7 g/dL   4.0    AST 10 - 35 U/L 26   20   39    ALT 9 - 46 U/L 34   23   48    Alk Phosphatase 48 - 121 IU/L   75    Total Bilirubin 0.2 - 1.2 mg/dL 0.7   0.7   0.5    Bilirubin, Direct 0.00 - 0.40 mg/dL   0.13  Lab Results  Component Value Date/Time   TSH 1.47 09/03/2017 09:34 AM       Latest Ref Rng & Units 05/25/2020    8:14 AM 11/25/2019    9:59 AM 08/10/2018    3:29 PM  CBC  WBC 3.8 - 10.8 Thousand/uL 4.6   6.5   5.5    Hemoglobin 13.2 - 17.1 g/dL 13.1   13.2   14.3    Hematocrit 38.5 - 50.0 % 39.5   40.0   43.9    Platelets 140 - 400 Thousand/uL 189   291   218      No results found for: VD25OH  Clinical ASCVD: Yes  The 10-year ASCVD risk score (Arnett DK, et al., 2019) is: 26.4%   Values used to calculate the score:     Age: 37 years     Sex: Male     Is Non-Hispanic African American: No     Diabetic: No     Tobacco smoker: No     Systolic Blood Pressure: 353 mmHg     Is BP treated: Yes     HDL Cholesterol: 58 mg/dL     Total  Cholesterol: 131 mg/dL       01/11/2021   11:20 AM 05/25/2020    8:04 AM 11/25/2019    9:34 AM  Depression screen PHQ 2/9  Decreased Interest 0 0 0  Down, Depressed, Hopeless 0 0 0  PHQ - 2 Score 0 0 0     Social History   Tobacco Use  Smoking Status Former   Types: Pipe   Quit date: 1982   Years since quitting: 41.4  Smokeless Tobacco Never  Tobacco Comments   smoked a pipe at age 85 to 60   BP Readings from Last 3 Encounters:  10/25/20 130/61  09/25/20 140/68  09/06/20 (!) 150/73   Pulse Readings from Last 3 Encounters:  10/25/20 61  09/25/20 68  09/06/20 62   Wt Readings from Last 3 Encounters:  01/11/21 186 lb (84.4 kg)  10/25/20 194 lb (88 kg)  09/25/20 188 lb (85.3 kg)   BMI Readings from Last 3 Encounters:  01/11/21 24.54 kg/m  10/25/20 25.60 kg/m  09/25/20 24.80 kg/m    Assessment/Interventions: Review of patient past medical history, allergies, medications, health status, including review of consultants reports, laboratory and other test data, was performed as part of comprehensive evaluation and provision of chronic care management services.   SDOH:  (Social Determinants of Health) assessments and interventions performed: Yes   Financial Resource Strain: Low Risk    Difficulty of Paying Living Expenses: Not hard at all    SDOH Screenings   Alcohol Screen: Low Risk    Last Alcohol Screening Score (AUDIT): 0  Depression (PHQ2-9): Low Risk    PHQ-2 Score: 0  Financial Resource Strain: Low Risk    Difficulty of Paying Living Expenses: Not hard at all  Food Insecurity: No Food Insecurity   Worried About Charity fundraiser in the Last Year: Never true   Ran Out of Food in the Last Year: Never true  Housing: Low Risk    Last Housing Risk Score: 0  Physical Activity: Sufficiently Active   Days of Exercise per Week: 5 days   Minutes of Exercise per Session: 30 min  Social Connections: Engineer, building services of Communication with  Friends and Family: More than three times a week   Frequency of Social Gatherings with Friends and Family: More  than three times a week   Attends Religious Services: More than 4 times per year   Active Member of Clubs or Organizations: Yes   Attends Music therapist: More than 4 times per year   Marital Status: Married  Stress: No Stress Concern Present   Feeling of Stress : Not at all  Tobacco Use: Medium Risk   Smoking Tobacco Use: Former   Smokeless Tobacco Use: Never   Passive Exposure: Not on file  Transportation Needs: No Transportation Needs   Lack of Transportation (Medical): No   Lack of Transportation (Non-Medical): No    CCM Care Plan  No Known Allergies  Medications Reviewed Today     Reviewed by Chriss Driver, LPN (Licensed Practical Nurse) on 01/11/21 at 1120  Med List Status: <None>   Medication Order Taking? Sig Documenting Provider Last Dose Status Informant  apixaban (ELIQUIS) 5 MG TABS tablet [id number] Yes TAKE 1 TABLET BY MOUTH TWICE A DAY Lorretta Harp, MD Taking Active   atorvastatin (LIPITOR) 20 MG tablet [id number] Yes TAKE 1 TABLET BY MOUTH EVERY DAY Lorretta Harp, MD Taking Active   CHOLECALCIFEROL PO [id number] No Take 1,000 Units by mouth daily.  Patient not taking: Reported on 01/11/2021   [provider] Not Taking Active Self  Cyanocobalamin (VITAMIN B-12 CR) 1500 MCG TBCR [id number] No Take 1 tablet by mouth daily.  Patient not taking: Reported on 01/11/2021   [provider] Not Taking Active Self  gabapentin (NEURONTIN) 300 MG capsule [id number] Yes Take 1 capsule (300 mg total) by mouth at bedtime. Susy Frizzle, MD Taking Active   GLUCOSAMINE HCL PO [id number] No Take 2,000 mg by mouth daily.  Patient not taking: Reported on 01/11/2021   [provider] Not Taking Active Self  lisinopril (ZESTRIL) 5 MG tablet [id number] Yes TAKE 1 TABLET BY MOUTH EVERY DAY Lorretta Harp, MD Taking Active    metoprolol tartrate (LOPRESSOR) 25 MG tablet [id number] Yes Take 0.5 tablets (12.5 mg total) by mouth every 6 (six) hours as needed (palpitations). Lorretta Harp, MD Taking Active            Med Note Langston Masker, Toney Rakes Jan 11, 2021 11:20 AM) Pt states he only takes as needed due to palpitations.   Omega-3 Fatty Acids (FISH OIL) 1000 MG CAPS [id number] No Take 1,000 mg by mouth daily.   Patient not taking: Reported on 01/11/2021   [provider] Not Taking Active Self  PFIZER COVID-19 [facility] BIVALENT injection [id number] Yes  [provider] Taking Active             Patient Active Problem List   Diagnosis Date Noted   Paroxysmal atrial fibrillation ([place]) 11/03/2018   Essential hypertension 03/13/2018   Thoracic aortic aneurysm ([place]) 12/10/2017   Bicuspid aortic valve 12/10/2017   Dyslipidemia 09/18/2017   Paroxysmal supraventricular tachycardia ([place]) 09/18/2017   Former pipe smoker 09/18/2017   Aortic insufficiency 09/09/2017   Palpitations 09/04/2014    Immunization History  Administered Date(s) Administered   Fluad Quad(high Dose 65+) 09/15/2018   Influenza-Unspecified 10/23/2020   Moderna Sars-Covid-2 Vaccination 03/27/2019, 11/08/2019, 06/05/2020   Pfizer Covid-19 Vaccine Bivalent Booster 11yr & up 10/23/2020   Pneumococcal Polysaccharide-23 09/15/2018   Zoster Recombinat (Shingrix) 07/24/2020    Conditions to be addressed/monitored:  Tachycardia, HTN, Afib,  Dyslipidemia, Back pain  There are no care plans that you recently modified to display for this patient.  Medication Assistance: None required.  Patient affirms current coverage meets needs.  Compliance/Adherence/Medication fill history:  Star-Rating Drugs: Atorvastatin 20 mg 90 DS 05/29/21 90ds Lisinopril 5 mg 90 DS 04/20/21 90ds  Patient's preferred pharmacy is:  [id number] [place] [id number]: [phone]: [phone]  Uses pill box? Yes Pt endorses 100% compliance  We discussed: Benefits of medication synchronization, packaging and delivery as well as enhanced pharmacist oversight with [employer]. Patient decided to: Continue current medication management strategy  Care Plan and Follow Up Patient Decision:  Patient agrees to Care Plan and Follow-up.  Plan: The care management team will reach out to the patient again over the next 180 days.  CBeverly Milch PharmD Clinical Pharmacist BJonni SangerFamily Medicine [phone]   Current Barriers:  Unable to achieve control of back pain   Pharmacist Clinical Goal(s):  Patient will achieve control of pain as evidenced by pain level adhere to prescribed medication regimen as evidenced by fill dates contact provider office for questions/concerns as evidenced notation of same in electronic health record through collaboration with PharmD and provider.   Interventions: 1:1 collaboration with PSusy Frizzle MD regarding development and update of comprehensive plan of care as evidenced by provider attestation and co-signature Inter-disciplinary care team collaboration (see longitudinal plan of care) Comprehensive medication review performed; medication list updated in electronic medical record  Hypertension (BP goal <140/90) -Uncontrolled -Current treatment: Lisinopril 553mdaily -Medications previously tried: none noted  -Current home readings: not checking -Current dietary habits: well rounded diet -Current exercise habits: walks for about 30 minutes daily on a regular basis -Denies hypotensive/hypertensive symptoms -Educated on BP goals and benefits of medications for prevention of heart attack, stroke and kidney damage; Exercise goal of 150 minutes per week; Importance of home blood pressure monitoring; Symptoms of hypotension and importance of maintaining adequate  hydration; -Counseled to monitor BP at home if symptomatic, document, and provide log at future appointments -Recommended to continue current medication  Hyperlipidemia: (LDL goal < 70) -Controlled -Current treatment: Atorvastatin 202maily -Medications previously tried: none noted  -Educated on Cholesterol goals;  Benefits of statin for ASCVD risk reduction; Importance of limiting foods high in cholesterol; Exercise goal of 150 minutes per week;  -Denies any adverse effects, adherent to medication, LDL is controlled -Recommended to continue current medication  Atrial Fibrillation/Tachycardia (Goal: prevent stroke and major bleeding) -Controlled  -Current treatment: Rate control: Metoprolol tartrate 8m59me-half tablet prn palpitations Anticoagulation: Eliquis 5mg 51m -Medications previously tried: none noted -Home BP and HR readings: does not check, controlled in office  -Counseled on increased risk of stroke due to Afib and benefits of anticoagulation for stroke prevention; importance of adherence to anticoagulant exactly as prescribed; bleeding risk associated with Eliquis and importance of self-monitoring for signs/symptoms of bleeding; avoidance of NSAIDs due to increased bleeding risk with anticoagulants;  -He denies any abnormal bleeding or bruising -Takes metoprolol on prn basis, has experienced bradycardia in the past -Recommended to continue current medication Assessed patient finances. Currently Eliquis is affordable  Update 12/15/20 Only occasionally taking metoprolol still.  Eliquis copay is still affordable.  He has not had any chest pain/rapid HR.  BP has remained controlled in office.  Denies abnormal bleeding or bruising. No changes needed at this time, continue current meds.  Back Pain (Goal: Minimize symptom) -Controlled -Current treatment  Gabapentin 300mg 68my Appropriate, Effective, Safe, Accessible -  Medications previously tried: none noted -Had MRI  which showed bulging disc between L4-L5 which was potentially causing pain.  PCP gave him option of cortisone injection or meeting with neurosurgeon for surgery.  At that time he opted for conservative therapy.  Started gabapentin a few months ago.  States it has controlled his pain.  If he takes it about 1 hour before bed it works the best.  Denies any adverse effects with med at this time. -Recommended to continue current medication He is satisfied at this time with pain level.  Update 06/21/21 Recently started on Flexeril for his back pain.   Patient Goals/Self-Care Activities Patient will:  - take medications as prescribed check blood pressure periodically, document, and provide at future appointments target a minimum of 150 minutes of moderate intensity exercise weekly  Follow Up Plan: The care management team will reach out to the patient again over the next 180 days.

## 2021-06-21 ENCOUNTER — Ambulatory Visit: Payer: PPO | Admitting: Pharmacist

## 2021-06-21 DIAGNOSIS — E785 Hyperlipidemia, unspecified: Secondary | ICD-10-CM

## 2021-06-21 DIAGNOSIS — M5441 Lumbago with sciatica, right side: Secondary | ICD-10-CM

## 2021-06-22 IMAGING — MR MR LUMBAR SPINE WO/W CM
5 of 8 series · 25 of 48 positions shown · IV contrast (17ml Multihance)
Comparison: Lumbar radiographs June 13, 2020.

CLINICAL DATA: Intervertebral disc degeneration.  Low back pain.

EXAM:
MRI LUMBAR SPINE WITHOUT AND WITH CONTRAST
TECHNIQUE: Multiplanar and multiecho pulse sequences of the lumbar spine were
obtained without and with intravenous contrast.
CONTRAST:  20mL MULTIHANCE GADOBENATE DIMEGLUMINE 529 MG/ML IV SOLN

[Series 3: T1 · sagittal · 4.0mm · 0.88mm/px · 4 of 17 slices shown (1 of 2)]
[im 1/17]
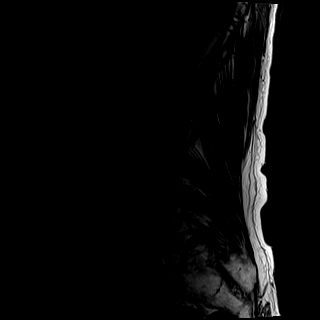
[im 6/17]
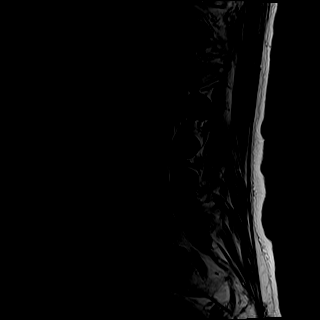
[im 11/17]
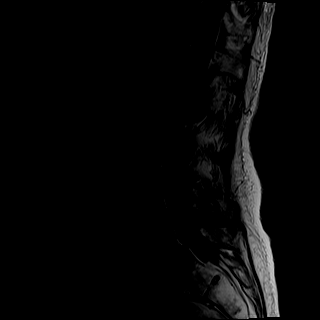
[im 17/17]
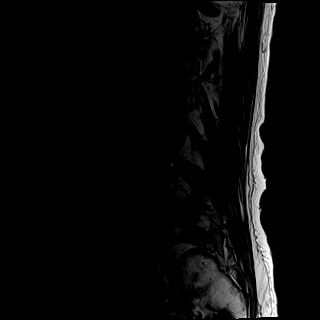

[Series 5: T2 · coronal · 4.0mm · 1.09mm/px · 4 of 19 slices shown (1 of 3)]
[im 1/19]
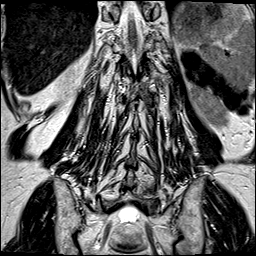
[im 7/19]
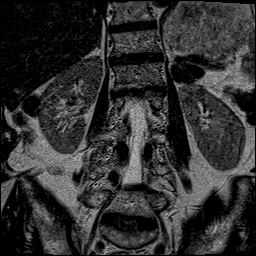
[im 13/19]
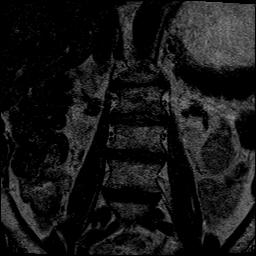
[im 19/19]
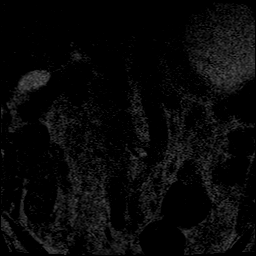

[Series 6: T2 · axial · 4.0mm · 0.39mm/px · z∈[-259,-4]mm · 8 of 49 slices shown (2 of 3)]
[im 1/49]
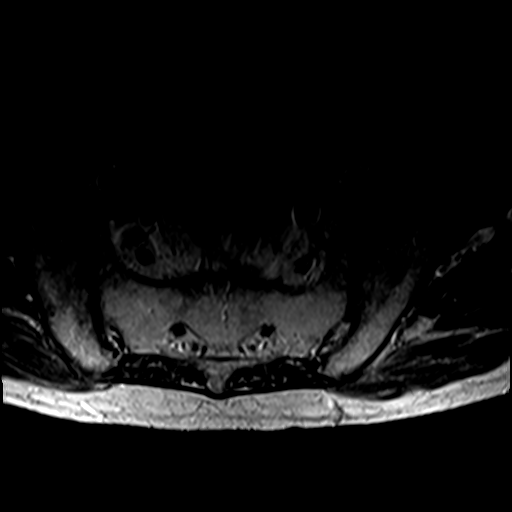
[im 6/49]
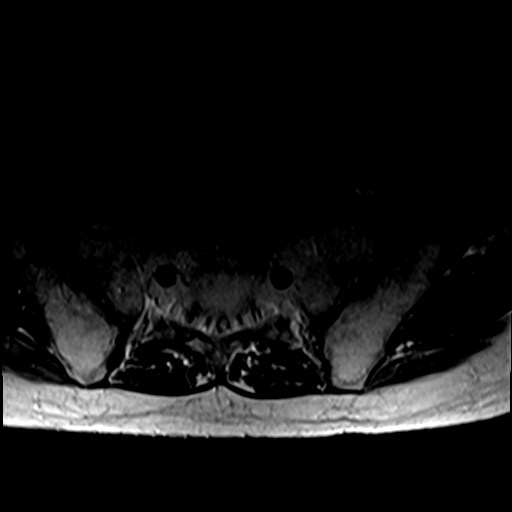
[im 17/49]
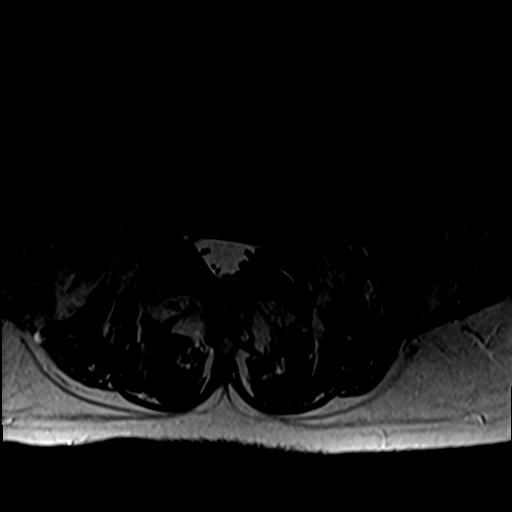
[im 22/49]
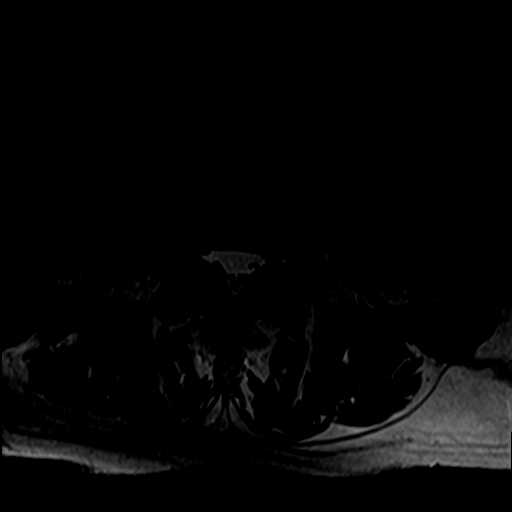
[im 27/49]
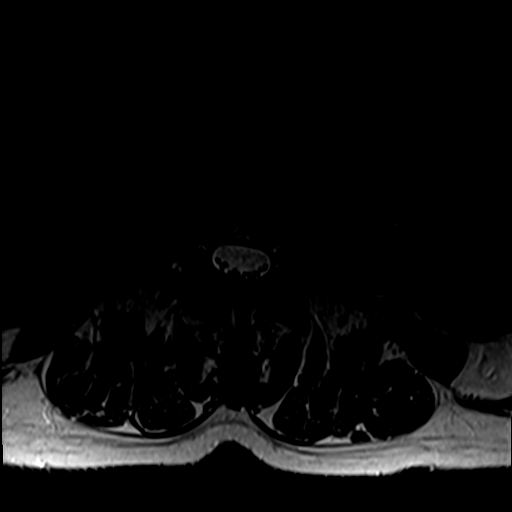
[im 33/49]
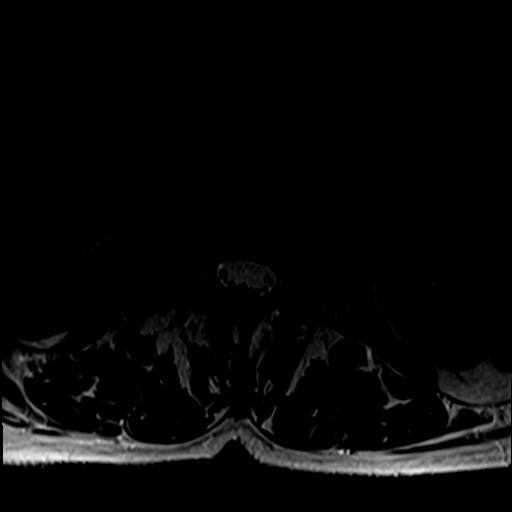
[im 43/49]
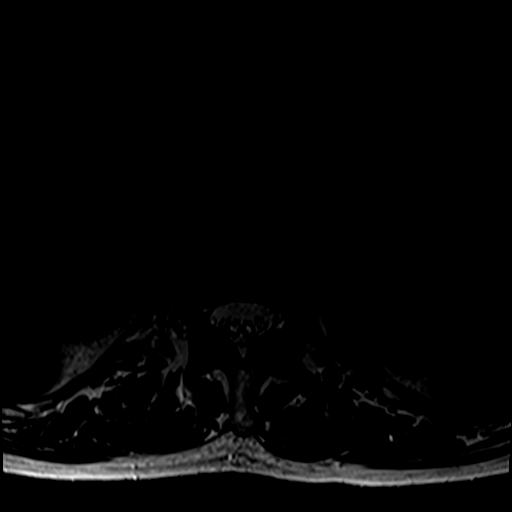
[im 49/49]
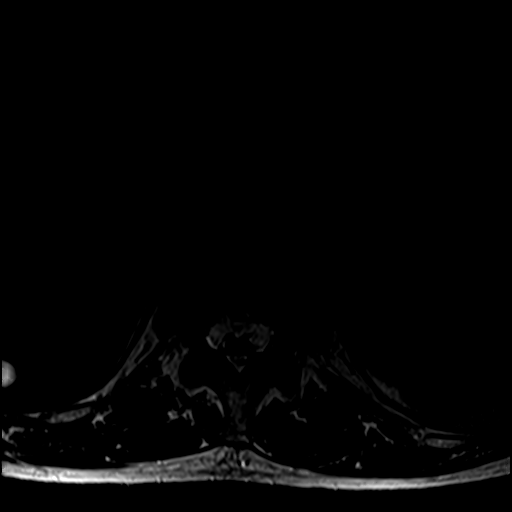

[Series 7: T1 · axial · 4.0mm · 0.39mm/px · z∈[-259,-80]mm · 6 of 49 slices shown (2 of 2)]
[im 1/49]
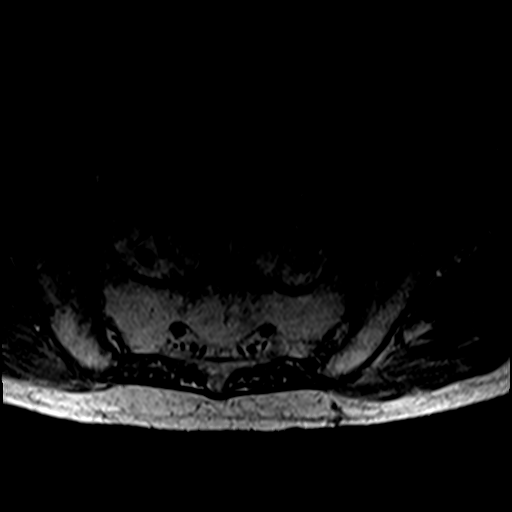
[im 6/49]
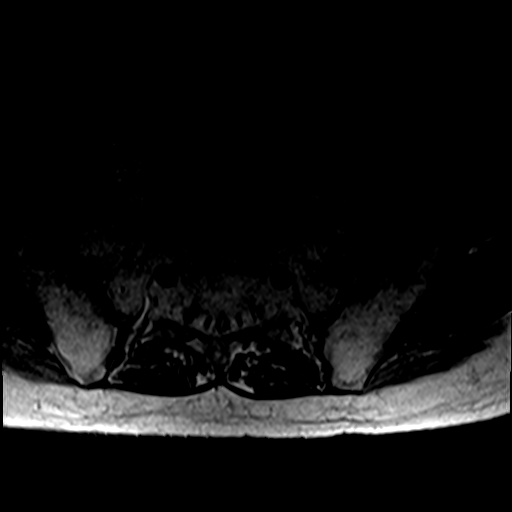
[im 17/49]
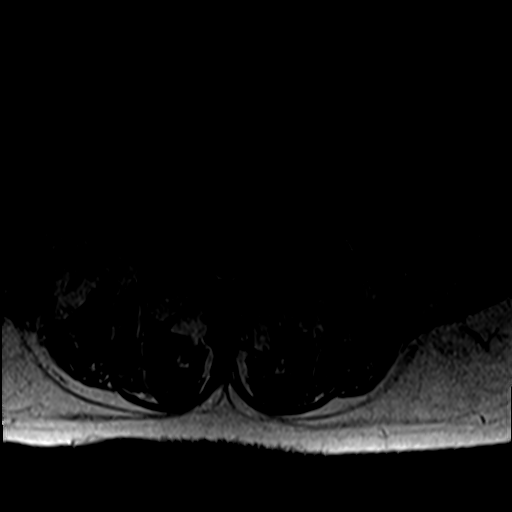
[im 22/49]
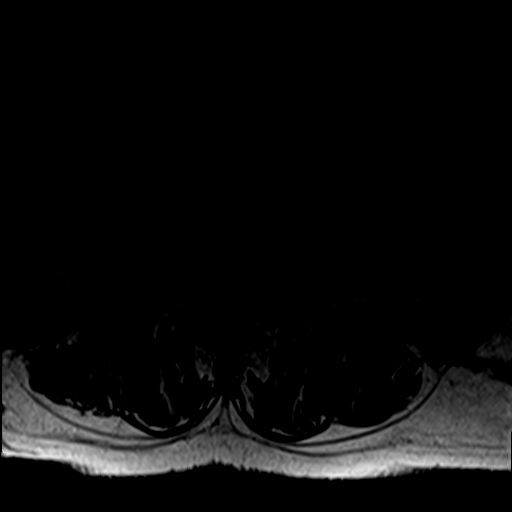
[im 27/49]
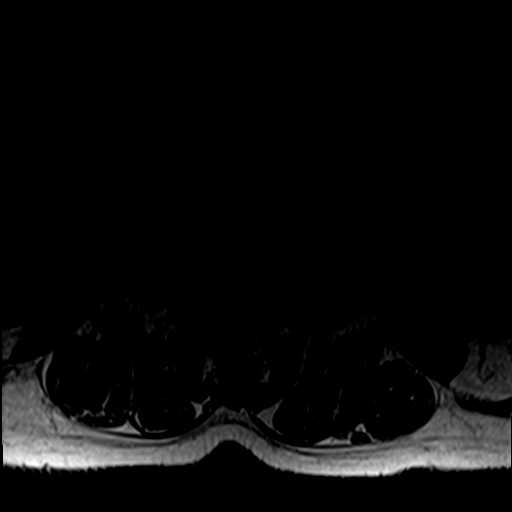
[im 33/49]
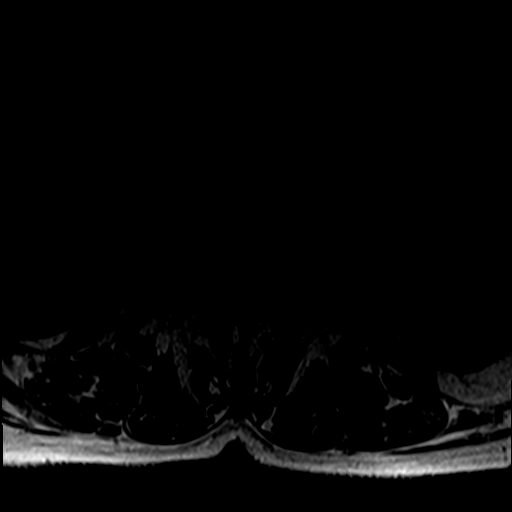

[Series 8: T2 · sagittal · 4.0mm · 1.09mm/px · 3 of 17 slices shown (3 of 3)]
[im 1/17]
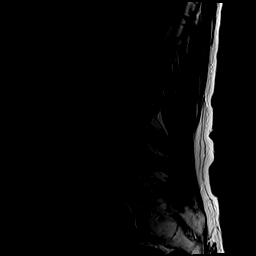
[im 9/17]
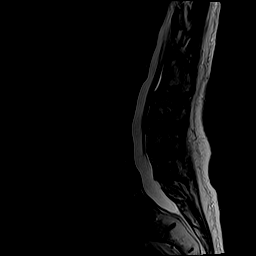
[im 17/17]
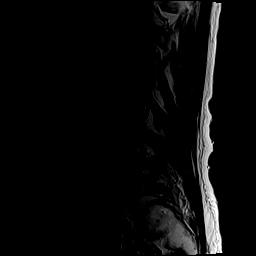

[25 of 48 positions shown; findings below may reference images not displayed]

FINDINGS: Segmentation: Standard segmentation is assumed. Inferior-most fully
formed intervertebral disc is labeled L5-S1.

Alignment: Slight retrolisthesis of L1 on L2. Lumbar levocurvature.

Vertebrae: Vertebral body heights are maintained.
Degenerative/discogenic endplate signal changes about the right
eccentric L1-L2 disc. No specific evidence of acute fracture or
discitis/osteomyelitis. Scattered benign vertebral venous
malformations without suspicious bone lesion.

Conus medullaris and cauda equina: Conus extends to the T12-L1
level. Conus appears normal.

Paraspinal and other soft tissues: Partially imaged hepatic cyst.

Disc levels:

T12-L1: No significant disc protrusion, foraminal stenosis, or canal
stenosis.

L1-L2: Disc height loss and desiccation with degenerative endplate
signal changes. Slight retrolisthesis of L1 on L2. Biforaminal disc
protrusions. Resulting moderate bilateral foraminal stenosis and
mild bilateral subarticular recess stenosis without significant
central canal stenosis.

L2-L3: Slight disc bulge without significant canal or foraminal
stenosis.

L3-L4: Slight disc bulge with small right far lateral disc
protrusion. Mild bilateral facet hypertrophy. Mild right foraminal
stenosis. No significant canal stenosis.

L4-L5: Slight right a centric disc bulge. Mild left greater than
right facet hypertrophy. Moderate left subarticular recess stenosis
with disc contacting the descending left L5 nerve root. No
significant canal stenosis. No significant foraminal stenosis.

L5-S1: Broad disc bulge and mild bilateral facet hypertrophy without
significant canal or foraminal stenosis.
IMPRESSION: 1. At L1-L2, degenerative disease with moderate bilateral foraminal
stenosis.
2. At L4-L5, moderate left subarticular recess stenosis with disc
contacting the descending left L5 nerve roots.

## 2021-06-24 ENCOUNTER — Other Ambulatory Visit: Payer: Self-pay | Admitting: Family Medicine

## 2021-06-24 DIAGNOSIS — G8929 Other chronic pain: Secondary | ICD-10-CM

## 2021-06-26 DIAGNOSIS — Z08 Encounter for follow-up examination after completed treatment for malignant neoplasm: Secondary | ICD-10-CM | POA: Diagnosis not present

## 2021-06-26 DIAGNOSIS — L570 Actinic keratosis: Secondary | ICD-10-CM | POA: Diagnosis not present

## 2021-06-26 DIAGNOSIS — D225 Melanocytic nevi of trunk: Secondary | ICD-10-CM | POA: Diagnosis not present

## 2021-06-26 DIAGNOSIS — L821 Other seborrheic keratosis: Secondary | ICD-10-CM | POA: Diagnosis not present

## 2021-06-26 DIAGNOSIS — L814 Other melanin hyperpigmentation: Secondary | ICD-10-CM | POA: Diagnosis not present

## 2021-06-26 DIAGNOSIS — C4441 Basal cell carcinoma of skin of scalp and neck: Secondary | ICD-10-CM | POA: Diagnosis not present

## 2021-06-26 DIAGNOSIS — Z85828 Personal history of other malignant neoplasm of skin: Secondary | ICD-10-CM | POA: Diagnosis not present

## 2021-06-26 DIAGNOSIS — D485 Neoplasm of uncertain behavior of skin: Secondary | ICD-10-CM | POA: Diagnosis not present

## 2021-06-26 NOTE — Patient Instructions (Addendum)
Visit Information   Goals Addressed             This Visit's Progress    Track and Manage Heart Rate and Rhythm-Atrial Fibrillation   On track    Timeframe:  Long-Range Goal Priority:  High Start Date:   06/15/20                          Expected End Date:  12/15/20                     Follow Up Date 10/13/20   - make a plan to exercise regularly - make a plan to eat healthy - take medicine as prescribed    Why is this important?   Atrial fibrillation may have no symptoms. Sometimes the symptoms get worse or happen more often.  It is important to keep track of what your symptoms are and when they happen.  A change in symptoms is important to discuss with your doctor or nurse.  Being active and healthy eating will also help you manage your heart condition.     Notes:        Patient Care Plan: General Pharmacy (Adult)     Problem Identified: Tachycardia, HTN, Afib,  Dyslipidemia   Priority: High  Onset Date: 06/15/2020     Long-Range Goal: Patient-Specific Goal   Start Date: 06/15/2020  Expected End Date: 12/15/2020  Recent Progress: On track  Priority: High  Note:   Current Barriers:  Unable to achieve control of back pain   Pharmacist Clinical Goal(s):  Patient will achieve control of pain as evidenced by pain level adhere to prescribed medication regimen as evidenced by fill dates contact provider office for questions/concerns as evidenced notation of same in electronic health record through collaboration with PharmD and provider.   Interventions: 1:1 collaboration with Susy Frizzle, MD regarding development and update of comprehensive plan of care as evidenced by provider attestation and co-signature Inter-disciplinary care team collaboration (see longitudinal plan of care) Comprehensive medication review performed; medication list updated in electronic medical record  Hypertension (BP goal <140/90) -Uncontrolled -Current treatment: Lisinopril '5mg'$   daily -Medications previously tried: none noted  -Current home readings: not checking -Current dietary habits: well rounded diet -Current exercise habits: walks for about 30 minutes daily on a regular basis -Denies hypotensive/hypertensive symptoms -Educated on BP goals and benefits of medications for prevention of heart attack, stroke and kidney damage; Exercise goal of 150 minutes per week; Importance of home blood pressure monitoring; Symptoms of hypotension and importance of maintaining adequate hydration; -Counseled to monitor BP at home if symptomatic, document, and provide log at future appointments -Recommended to continue current medication  Hyperlipidemia: (LDL goal < 70) -Controlled -Current treatment: Atorvastatin '20mg'$  daily Appropriate, Effective, Safe, Accessible -Medications previously tried: none noted  -Educated on Cholesterol goals;  Benefits of statin for ASCVD risk reduction; Importance of limiting foods high in cholesterol; Exercise goal of 150 minutes per week;  -Denies any adverse effects, adherent to medication, LDL is controlled -Recommended to continue current medication  Update 06/22/11 LDL excelled at goal < 70. He is due for recheck. Has upcoming visit with Dr. Dennard Schaumann, would recommend he recheck lipids to ensure control. No changes to meds at this time continue as current.  Atrial Fibrillation/Tachycardia (Goal: prevent stroke and major bleeding) -Controlled  -Current treatment: Rate control: Metoprolol tartrate '25mg'$  one-half tablet prn palpitations Anticoagulation: Eliquis '5mg'$  BID -Medications previously tried: none  noted -Home BP and HR readings: does not check, controlled in office  -Counseled on increased risk of stroke due to Afib and benefits of anticoagulation for stroke prevention; importance of adherence to anticoagulant exactly as prescribed; bleeding risk associated with Eliquis and importance of self-monitoring for signs/symptoms of  bleeding; avoidance of NSAIDs due to increased bleeding risk with anticoagulants;  -He denies any abnormal bleeding or bruising -Takes metoprolol on prn basis, has experienced bradycardia in the past -Recommended to continue current medication Assessed patient finances. Currently Eliquis is affordable  Update 12/15/20 Only occasionally taking metoprolol still.  Eliquis copay is still affordable.  He has not had any chest pain/rapid HR.  BP has remained controlled in office.  Denies abnormal bleeding or bruising. No changes needed at this time, continue current meds.  Back Pain (Goal: Minimize symptom) -Controlled -Current treatment  Gabapentin '300mg'$  daily Appropriate, Effective, Safe, Accessible -Medications previously tried: none noted -Had MRI which showed bulging disc between L4-L5 which was potentially causing pain.  PCP gave him option of cortisone injection or meeting with neurosurgeon for surgery.  At that time he opted for conservative therapy.  Started gabapentin a few months ago.  States it has controlled his pain.  If he takes it about 1 hour before bed it works the best.  Denies any adverse effects with med at this time. -Recommended to continue current medication He is satisfied at this time with pain level.  Update 06/21/21 Recently started on Flexeril for his back pain. It is helping some, however he is still having pain. Still taking a lower dose of gabapentin.  He is interested in seeing a physical therapist to see if this will do anything in managing his pain. Encouraged his to reach out for referral from Dr. Dennard Schaumann. Upon checking referrals, looks like one has been placed. Keep FU with Dr. Dennard Schaumann to discuss progress and other alternatives.   Patient Goals/Self-Care Activities Patient will:  - take medications as prescribed check blood pressure periodically, document, and provide at future appointments target a minimum of 150 minutes of moderate intensity exercise  weekly  Follow Up Plan: The care management team will reach out to the patient again over the next 180 days.           The patient verbalized understanding of instructions, educational materials, and care plan provided today and DECLINED offer to receive copy of patient instructions, educational materials, and care plan.  Telephone follow up appointment with pharmacy team member scheduled for: 6 months  [name], [place], PharmD, [place] Clinical Pharmacist Practitioner [place] [phone]

## 2021-07-02 ENCOUNTER — Ambulatory Visit (INDEPENDENT_AMBULATORY_CARE_PROVIDER_SITE_OTHER): Payer: PPO | Admitting: Family Medicine

## 2021-07-02 VITALS — BP 130/80 | HR 64 | Temp 98.2°F | Ht 73.0 in | Wt 187.0 lb

## 2021-07-02 DIAGNOSIS — M5432 Sciatica, left side: Secondary | ICD-10-CM | POA: Diagnosis not present

## 2021-07-02 MED ORDER — GABAPENTIN 300 MG PO CAPS: 300.0000 mg | ORAL_CAPSULE | Freq: Every day | ORAL | 5 refills | Status: DC

## 2021-07-02 NOTE — Progress Notes (Signed)
Subjective:    Patient ID: Jerry Velazquez, male    DOB: April 04, 1945, 76 y.o.   MRN: [id number]  Back Pain    IMPRESSION: 1. At L1-L2, degenerative disease with moderate bilateral foraminal stenosis. 2. At L4-L5, moderate left subarticular recess stenosis with disc contacting the descending left L5 nerve roots.  As shown above, MRI showed possible nerve impingement on the left L5 nerve root.  Patient has been taking gabapentin 300 to 900 mg daily.  He typically takes 3 tablets at night and this helps him sleep.  He is having increasing pain radiating down his left leg.  It radiates from his gluteus onto his anterior left thigh and into his posterior left thigh.  The pain is not unbearable but it is certainly worsening.  He denies any bowel or bladder incontinence.  He denies any saddle anesthesia.  He denies any leg weakness. Past Medical History:  Diagnosis Date   Aortic aneurysm ([facility])    Aortic insufficiency due to bicuspid aortic valve    Aortic stenosis due to bicuspid aortic valve    Atrial flutter ([facility])    Bicuspid aortic valve    GERD (gastroesophageal reflux disease)    past hx- stopped alcohol and no more issues    NSVT (nonsustained ventricular tachycardia)    Paroxysmal atrial fibrillation ([facility])    PVC's (premature ventricular contractions)    RBBB    Past Surgical History:  Procedure Laterality Date   COLONOSCOPY     12 yrs ago [facility]- normal per pt    CYST EXCISION     left knee    CYST EXCISION     tongue   Current Outpatient Medications on File Prior to Visit  Medication Sig Dispense Refill   atorvastatin (LIPITOR) 20 MG tablet TAKE 1 TABLET BY MOUTH EVERY DAY 90 tablet 3   cyclobenzaprine (FLEXERIL) 10 MG tablet Take 1 tablet (10 mg total) by mouth 3 (three) times daily as needed for muscle spasms. 30 tablet 0   ELIQUIS 5 MG TABS tablet TAKE 1 TABLET BY MOUTH TWICE A DAY 180 tablet 1   GLUCOSAMINE HCL PO Take 2,000 mg by mouth daily.     lisinopril (ZESTRIL) 5  MG tablet TAKE 1 TABLET BY MOUTH EVERY DAY 90 tablet 3   Omega-3 Fatty Acids (FISH OIL) 1000 MG CAPS Take 1,000 mg by mouth daily.     PFIZER COVID-19 VAC BIVALENT injection      metoprolol tartrate (LOPRESSOR) 25 MG tablet Take 0.5 tablets (12.5 mg total) by mouth every 6 (six) hours as needed (palpitations). 30 tablet 0   No current facility-administered medications on file prior to visit.   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   Social Determinants of Health   Financial Resource Strain: Low Risk  (01/11/2021)   Overall Financial Resource Strain (CARDIA)    Difficulty of Paying Living Expenses: Not hard at all  Food Insecurity: No Food Insecurity (01/11/2021)   Hunger Vital Sign    Worried About Running Out of Food in the Last Year: Never true    Ran Out of Food in the Last Year: Never true  Transportation Needs: No Transportation Needs (01/11/2021)   PRAPARE - Hydrologist (Medical): No    Lack of Transportation (Non-Medical): No  Physical Activity: Sufficiently Active (01/11/2021)   Exercise Vital Sign    Days of Exercise per Week: 5 days    Minutes of Exercise per Session: 30 min  Stress: No Stress Concern Present (01/11/2021)   [place]    Feeling of Stress : Not at all  Social Connections: [facility] (01/11/2021)   Social Connection and Isolation Panel [NHANES]    Frequency of Communication with Friends and Family: More than three times a week    Frequency of Social Gatherings with Friends and Family: More than three times a week    Attends Religious Services: More than 4 times per year    Active Member of [employer] or Organizations: Yes    Attends Music therapist: More than 4 times per year    Marital Status: Married  Human resources officer Violence: Not At Risk (01/11/2021)   Humiliation, Afraid, Rape, and Kick questionnaire    Fear of Current or Ex-Partner: No    Emotionally Abused: No    Physically Abused: No    Sexually Abused: No   Family History  Problem Relation Age of Onset   Heart disease Mother    Hypertension Father    Stroke Father    COPD Sister    Colon polyps Sister    Hypertension Brother    COPD Brother    Colon cancer Neg Hx    Esophageal cancer Neg Hx    Rectal cancer Neg Hx    Stomach cancer Neg Hx       Review of Systems  Musculoskeletal:  Positive for back pain.  All other systems reviewed and are negative.      Objective:   Physical Exam Vitals reviewed.  Constitutional:      General: He is not in acute distress.    Appearance: Normal appearance. He is normal weight. He is not ill-appearing, toxic-appearing or diaphoretic.  HENT:     Head: Normocephalic and atraumatic.     Nose: Rhinorrhea present.     Mouth/Throat:     Pharynx: No oropharyngeal exudate or posterior oropharyngeal erythema.  Eyes:     General: No scleral icterus.       Right eye: No discharge.        Left eye: No discharge.     Extraocular Movements: Extraocular movements intact.     Conjunctiva/sclera: Conjunctivae normal.     Pupils: Pupils are equal, round, and reactive to light.  Cardiovascular:     Rate and Rhythm:  Normal rate and regular rhythm.     Pulses: Normal pulses.     Heart sounds: Murmur heard.     No friction rub. No gallop.  Pulmonary:     Effort: Pulmonary effort is  normal. No respiratory distress.     Breath sounds: Normal breath sounds. No stridor. No wheezing, rhonchi or rales.  Chest:     Chest wall: No tenderness.  Abdominal:     General: Abdomen is flat. Bowel sounds are normal. There is no distension.     Palpations: Abdomen is soft. There is no mass.     Tenderness: There is no abdominal tenderness. There is no guarding or rebound.     Hernia: No hernia is present.  Musculoskeletal:     Lumbar back: Spasms and tenderness present. No bony tenderness. Decreased range of motion.     Right lower leg: No edema.  Skin:    General: Skin is warm.     Coloration: Skin is not jaundiced or pale.     Findings: No bruising, erythema, lesion or rash.  Neurological:     General: No focal deficit present.     Mental Status: He is alert and oriented to person, place, and time. Mental status is at baseline.     Cranial Nerves: No cranial nerve deficit.     Motor: No weakness.     Coordination: Coordination normal.     Gait: Gait normal.     Deep Tendon Reflexes: Reflexes normal.  Psychiatric:        Mood and Affect: Mood normal.        Behavior: Behavior normal.        Thought Content: Thought content normal.        Judgment: Judgment normal.           Assessment & Plan:  Chronic sciatica of left side Patient can continue to take gabapentin 300 mg 3 times a day as needed.  I recommended that we uptitrate this as long as he sees benefit gradually and slowly to a maximum of 900 mg 3 times a day.  I will refer the patient to physical therapy.  If not improving I would recommend an epidural steroid injection.

## 2021-07-03 ENCOUNTER — Encounter: Payer: Self-pay | Admitting: Cardiovascular Disease

## 2021-07-05 ENCOUNTER — Other Ambulatory Visit: Payer: Self-pay

## 2021-07-05 ENCOUNTER — Ambulatory Visit: Payer: PPO | Attending: Family Medicine

## 2021-07-05 DIAGNOSIS — G8929 Other chronic pain: Secondary | ICD-10-CM | POA: Diagnosis not present

## 2021-07-05 DIAGNOSIS — M5440 Lumbago with sciatica, unspecified side: Secondary | ICD-10-CM | POA: Diagnosis not present

## 2021-07-05 DIAGNOSIS — R2689 Other abnormalities of gait and mobility: Secondary | ICD-10-CM | POA: Diagnosis not present

## 2021-07-05 DIAGNOSIS — M5459 Other low back pain: Secondary | ICD-10-CM | POA: Insufficient documentation

## 2021-07-05 DIAGNOSIS — M6281 Muscle weakness (generalized): Secondary | ICD-10-CM | POA: Diagnosis not present

## 2021-07-05 NOTE — Therapy (Signed)
OUTPATIENT PHYSICAL THERAPY THORACOLUMBAR EVALUATION   Patient Name: Jerry Velazquez MRN: [id number] DOB:[birth year], 76 y.o., male Today's Date: 07/05/2021   PT End of Session - 07/05/21 1213     Visit Number 1    Number of Visits 17    Date for PT Re-Evaluation 08/30/21    Authorization Type H[employer] Medicare    PT Start Time 1215    PT Stop Time 1300    PT Time Calculation (min) 45 min    Activity Tolerance Patient tolerated treatment well    Behavior During Therapy WFL for tasks assessed/performed             Past Medical History:  Diagnosis Date   Aortic aneurysm ([place])    Aortic insufficiency due to bicuspid aortic valve    Aortic stenosis due to bicuspid aortic valve    Atrial flutter ([facility])    Bicuspid aortic valve    GERD (gastroesophageal reflux disease)    past hx- stopped alcohol and no more issues    NSVT (nonsustained ventricular tachycardia) ([facility])    Paroxysmal atrial fibrillation ([facility])    PVC's (premature ventricular contractions)    RBBB    Past Surgical History:  Procedure Laterality Date   COLONOSCOPY     12 yrs ago [facility]- normal per pt    CYST EXCISION     left knee    CYST EXCISION     tongue   Patient Active Problem List   Diagnosis Date Noted   Paroxysmal atrial fibrillation ([place]) 11/03/2018   Essential hypertension 03/13/2018   Thoracic aortic aneurysm ([place]) 12/10/2017   Bicuspid aortic valve 12/10/2017   Dyslipidemia 09/18/2017   Paroxysmal supraventricular tachycardia ([place]) 09/18/2017   Former pipe smoker 09/18/2017   Aortic insufficiency 09/09/2017   Palpitations 09/04/2014    PCP: Susy Frizzle, MD  REFERRING PROVIDER: Susy Frizzle, MD  REFERRING DIAG: M54.40,G89.29 (ICD-10-CM) - Chronic midline low back pain with sciatica, sciatica laterality unspecified  Rationale for Evaluation and Treatment Rehabilitation  THERAPY DIAG:  Other low back pain - Plan: PT plan of care cert/re-cert  Muscle weakness (generalized) - Plan:  PT plan of care cert/re-cert  Other abnormalities of gait and mobility - Plan: PT plan of care cert/re-cert  ONSET DATE: Chronic  SUBJECTIVE:                                                                                                                                                                                           SUBJECTIVE STATEMENT: Pt presents to PT with reports of chronic lower back and L LE pain. Had a neuroma in L foot  that was not diagnosed but was prescribed gabapentin which seemed to help. About six weeks ago that discomfort that had alleviated became much more noticeable. Has some "nerve pain" in posterior L LE that is now moving to anterior as well. Notes that his sleep has improved with sleeping on a firmer surface. Denies bowel/bladder changes or saddle anesthesia. Notes that he used to perform HEP and that he did not have pain for a while. Stopped around one year ago, with pain recently increasing within the last few months.   PERTINENT HISTORY:  Thoracic aortic aneurysm; HTN   PAIN:  Are you having pain?  Yes: NPRS scale: 2/10 (8/10 at worst) Pain location: L side of lower back, L LE Pain description: dull ache, sore Aggravating factors: prolonged sitting/standing Relieving factors: movement, positioning  PRECAUTIONS: No Lifting over 30# secondary to Thoracic Aortic Aneurysm   WEIGHT BEARING RESTRICTIONS No  FALLS:  Has patient fallen in last 6 months? No  LIVING ENVIRONMENT: Lives with: lives with their family Lives in: House/apartment Stairs: Yes - no difficulty Has following equipment at home: None  OCCUPATION: Retired  PLOF: Independent and Independent with basic ADLs  PATIENT GOALS: decrease lower back and L LE pain to improve comfort with desired activity   OBJECTIVE:   DIAGNOSTIC FINDINGS:  CLINICAL DATA:  Intervertebral disc degeneration.  Low back pain.   EXAM: MRI LUMBAR SPINE WITHOUT AND WITH CONTRAST   TECHNIQUE: Multiplanar  and multiecho pulse sequences of the lumbar spine were obtained without and with intravenous contrast.   CONTRAST:  109m MULTIHANCE GADOBENATE DIMEGLUMINE 529 MG/ML IV SOLN   COMPARISON:  Lumbar radiographs Jun 13, 2020.   FINDINGS: Segmentation: Standard segmentation is assumed. Inferior-most fully formed intervertebral disc is labeled L5-S1.   Alignment: Slight retrolisthesis of L1 on L2. Lumbar levocurvature.   Vertebrae: Vertebral body heights are maintained. Degenerative/discogenic endplate signal changes about the right eccentric L1-L2 disc. No specific evidence of acute fracture or discitis/osteomyelitis. Scattered benign vertebral venous malformations without suspicious bone lesion.   Conus medullaris and cauda equina: Conus extends to the T12-L1 level. Conus appears normal.   Paraspinal and other soft tissues: Partially imaged hepatic cyst.   Disc levels:   T12-L1: No significant disc protrusion, foraminal stenosis, or canal stenosis.   L1-L2: Disc height loss and desiccation with degenerative endplate signal changes. Slight retrolisthesis of L1 on L2. Biforaminal disc protrusions. Resulting moderate bilateral foraminal stenosis and mild bilateral subarticular recess stenosis without significant central canal stenosis.   L2-L3: Slight disc bulge without significant canal or foraminal stenosis.   L3-L4: Slight disc bulge with small right far lateral disc protrusion. Mild bilateral facet hypertrophy. Mild right foraminal stenosis. No significant canal stenosis.   L4-L5: Slight right a centric disc bulge. Mild left greater than right facet hypertrophy. Moderate left subarticular recess stenosis with disc contacting the descending left L5 nerve root. No significant canal stenosis. No significant foraminal stenosis.   L5-S1: Broad disc bulge and mild bilateral facet hypertrophy without significant canal or foraminal stenosis.   IMPRESSION: 1. At L1-L2,  degenerative disease with moderate bilateral foraminal stenosis. 2. At L4-L5, moderate left subarticular recess stenosis with disc contacting the descending left L5 nerve roots.  PATIENT SURVEYS:  FOTO: 72% function; 77% predicted  COGNITION:  Overall cognitive status: Within functional limits for tasks assessed    SENSATION: WFL  MUSCLE LENGTH: Thomas test: Right (-) deg; Left (+) deg  POSTURE: rounded shoulders and forward head  PALPATION: TTP to L lumbar  paraspinals, L gluteals  LOWER EXTREMITY MMT:    MMT Right 07/05/2021  Left 07/05/2021   Hip flexion     Hip extension    Hip abduction    Hip adduction    Hip external rotation    Hip internal rotation    Knee extension    Knee flexion    Ankle dorsiflexion     Ankle plantarflexion    Ankle inversion    Ankle eversion    Grossly 5/5 5/5  (Blank rows = not tested)  LUMBAR SPECIAL TESTS:  Straight leg raise test: Positive and Slump test: Positive  FUNCTIONAL TESTS:  30 Second Sit to Stand: 16 reps - increased pain Bridge: 30 sec  GAIT: Distance walked: 67f Assistive device utilized: None Level of assistance: Complete Independence Comments: trunked flexed  TODAY'S TREATMENT  [facility] Adult PT Treatment:                                                DATE: 07/05/2021 Therapeutic Exercise: Seated sciatic nerve glide x 10 L Modified thomas stretch x 60" L Bridge x 10  Supine PPT x 10 - 5" hold  PATIENT EDUCATION:  Education details: eval findings, FOTO, HEP, POC Person educated: Patient Education method: Explanation, Demonstration, and Handouts Education comprehension: verbalized understanding and returned demonstration   HOME EXERCISE PROGRAM: Access Code: [email or web address]: [email or web address] Date: 07/05/2021 Prepared by: DOctavio Manns Exercises - Seated Sciatic Tensioner  - 1 x daily - 7 x weekly - 2 sets - 10 reps - Standing Sciatic Nerve Mobilization on Step  - 1 x daily - 7 x  weekly - 2 sets - 10 reps - Modified Thomas Stretch  - 1 x daily - 7 x weekly - 2-3 reps - 60 seconds hold - Supine Bridge  - 1 x daily - 7 x weekly - 3 sets - 10 reps - 3 sec hold - Supine Posterior Pelvic Tilt  - 1 x daily - 7 x weekly - 3 sets - 10 reps - 5 sec hold  ASSESSMENT:  CLINICAL IMPRESSION: Patient is a 76y.o. M who was seen today for physical therapy evaluation and treatment for chronic LBP and discomfort. Physical findings are consistent with MD impression, as pt demonstrates deficits in proximal hip flexor muscle length as well as palpable discomfort in lumbar musculature. His FOTO score shows he is operating well below PLOF, indicating he would benefit from skilled PT services working on decreasing pain and improving functional ability. PT will focus on improving core and proximal hip strength for stabilization and functional improvement.   OBJECTIVE IMPAIRMENTS decreased activity tolerance, decreased endurance, decreased ROM, decreased strength, impaired flexibility, and pain.   ACTIVITY LIMITATIONS carrying, lifting, bending, standing, squatting, and sleeping  PARTICIPATION LIMITATIONS: cleaning, community activity, and yard work  PERSONAL FACTORS Time since onset of injury/illness/exacerbation and 1-2 comorbidities: Thoracic aortic aneurysm; HTN  are also affecting patient's functional outcome.   REHAB POTENTIAL: Excellent  CLINICAL DECISION MAKING: Stable/uncomplicated  EVALUATION COMPLEXITY: Low   GOALS: Goals reviewed with patient? No  SHORT TERM GOALS: Target date: 07/26/2021  Pt will be compliant and knowledgeable with initial HEP for improved comfort and carryover Baseline: initial HEP given  Goal status: INITIAL  2.  Pt will self report lower back and L LE pain no greater than 6/10 for improved comfort and functional  ability Baseline: 8/10 at worst Goal status: INITIAL  LONG TERM GOALS: Target date: 08/30/2021  Pt will self report lower back and L LE  pain no greater than 2/10 for improved comfort and functional ability Baseline: 8/10 at worst Goal status: INITIAL  2.  Pt will be able to sleep throughout the night not affected by pain for improved comfort Baseline: unable  Goal status: INITIAL  3.  Pt will improve FOTO function score to no less than 77% as proxy for functional improvement Baseline: 72% function Goal status: INITIAL  4.  Pt will be able to hold supine bridge for 60" for improved core endurance Baseline: 30 sec Goal status: INITIAL  PLAN: PT FREQUENCY: 1-2x/week  PT DURATION: 8 weeks  PLANNED INTERVENTIONS: Therapeutic exercises, Therapeutic activity, Neuromuscular re-education, Balance training, Gait training, Patient/Family education, Joint mobilization, Aquatic Therapy, Dry Needling, Electrical stimulation, Cryotherapy, Moist heat, Vasopneumatic device, Manual therapy, and Re-evaluation.  PLAN FOR NEXT SESSION: assess HEP response, L hip flexor stretching, improve neutral spine core strength   Ward Chatters, PT 07/05/2021, 1:54 PM

## 2021-07-11 ENCOUNTER — Ambulatory Visit: Payer: PPO

## 2021-07-11 DIAGNOSIS — M5459 Other low back pain: Secondary | ICD-10-CM | POA: Diagnosis not present

## 2021-07-11 DIAGNOSIS — M6281 Muscle weakness (generalized): Secondary | ICD-10-CM

## 2021-07-11 DIAGNOSIS — R2689 Other abnormalities of gait and mobility: Secondary | ICD-10-CM

## 2021-07-11 NOTE — Therapy (Signed)
OUTPATIENT PHYSICAL THERAPY TREATMENT NOTE   Patient Name: Jerry Velazquez MRN: [id number] DOB:1945/04/29, 76 y.o., male 13 Date: 07/12/2021  PCP: Susy Frizzle, MD REFERRING PROVIDER: Susy Frizzle, MD  END OF SESSION:   PT End of Session - 07/11/21 1740     Visit Number 2    Number of Visits 17    Date for PT Re-Evaluation 08/30/21    Authorization Type [employer] Medicare    PT Start Time 1745    PT Stop Time 1825    PT Time Calculation (min) 40 min    Activity Tolerance Patient tolerated treatment well    Behavior During Therapy WFL for tasks assessed/performed             Past Medical History:  Diagnosis Date   Aortic aneurysm ([place])    Aortic insufficiency due to bicuspid aortic valve    Aortic stenosis due to bicuspid aortic valve    Atrial flutter ([place])    Bicuspid aortic valve    GERD (gastroesophageal reflux disease)    past hx- stopped alcohol and no more issues    NSVT (nonsustained ventricular tachycardia) ([facility])    Paroxysmal atrial fibrillation ([facility])    PVC's (premature ventricular contractions)    RBBB    Past Surgical History:  Procedure Laterality Date   COLONOSCOPY     12 yrs ago [facility]- normal per pt    CYST EXCISION     left knee    CYST EXCISION     tongue   Patient Active Problem List   Diagnosis Date Noted   Paroxysmal atrial fibrillation ([place]) 11/03/2018   Essential hypertension 03/13/2018   Thoracic aortic aneurysm ([place]) 12/10/2017   Bicuspid aortic valve 12/10/2017   Dyslipidemia 09/18/2017   Paroxysmal supraventricular tachycardia ([place]) 09/18/2017   Former pipe smoker 09/18/2017   Aortic insufficiency 09/09/2017   Palpitations 09/04/2014    REFERRING DIAG:  M54.40,G89.29 (ICD-10-CM) - Chronic midline low back pain with sciatica, sciatica laterality unspecified  THERAPY DIAG:  Other low back pain  Muscle weakness (generalized)  Other abnormalities of gait and mobility  Rationale for Evaluation and Treatment  Rehabilitation  PERTINENT HISTORY: Thoracic aortic aneurysm; HTN   PRECAUTIONS: No Lifting over 30# secondary to Thoracic Aortic Aneurysm   SUBJECTIVE: Pt presents to PT with reports of decreased current pain, notes that some HEP has caused some discomfort and he is compliant in doing so. Pt is ready to begin PT at this time.  PAIN:  Are you having pain?  Yes: NPRS scale: 2/10 (8/10 at worst) Pain location: L side of lower back, L LE Pain description: dull ache, sore Aggravating factors: prolonged sitting/standing Relieving factors: movement, positioning  OBJECTIVE: (objective measures completed at initial evaluation unless otherwise dated)  PATIENT SURVEYS:  FOTO: 72% function; 77% predicted   COGNITION:           Overall cognitive status: Within functional limits for tasks assessed                 SENSATION: WFL   MUSCLE LENGTH: Thomas test: Right (-) deg; Left (+) deg   POSTURE: rounded shoulders and forward head   PALPATION: TTP to L lumbar paraspinals, L gluteals   LOWER EXTREMITY MMT:     MMT Right 07/05/2021  Left 07/05/2021   Hip flexion       Hip extension      Hip abduction      Hip adduction      Hip external  rotation      Hip internal rotation      Knee extension      Knee flexion      Ankle dorsiflexion       Ankle plantarflexion      Ankle inversion      Ankle eversion      Grossly 5/5 5/5  (Blank rows = not tested)   LUMBAR SPECIAL TESTS:  Straight leg raise test: Positive and Slump test: Positive   FUNCTIONAL TESTS:  30 Second Sit to Stand: 16 reps - increased pain Bridge: 30 sec   GAIT: Distance walked: 72f Assistive device utilized: None Level of assistance: Complete Independence Comments: trunked flexed   TODAY'S TREATMENT  [facility] Adult PT Treatment:                                                DATE: 07/11/2021 Therapeutic Exercise: Modified thomas stretch x 60" - with PT overpressure Supine PPT x 10 - 5" hold Supine PPT with  ball x10 - 5" hold Bridge 2x10  90/90 hold 2x15" Supine fig 4 stretch L 2x30" Supine piriformis stretch 2x30" L Standing hip abd/ext 2x10 30#  [facility] Adult PT Treatment:                                                DATE: 07/05/2021 Therapeutic Exercise: Seated sciatic nerve glide x 10 L Modified thomas stretch x 60" L Bridge x 10  Supine PPT x 10 - 5" hold   PATIENT EDUCATION:  Education details: eval findings, FOTO, HEP, POC Person educated: Patient Education method: Explanation, Demonstration, and Handouts Education comprehension: verbalized understanding and returned demonstration     HOME EXERCISE PROGRAM: Access Code: [email or web address]: [email or web address] Date: 07/11/2021 Prepared by: DOctavio Manns Exercises - Seated Sciatic Tensioner  - 1 x daily - 7 x weekly - 2 sets - 10 reps - Standing Sciatic Nerve Mobilization on Step  - 1 x daily - 7 x weekly - 2 sets - 10 reps - Modified Thomas Stretch  - 1 x daily - 7 x weekly - 2-3 reps - 60 seconds hold - Supine Bridge  - 1 x daily - 7 x weekly - 3 sets - 10 reps - 3 sec hold - Supine Posterior Pelvic Tilt  - 1 x daily - 7 x weekly - 3 sets - 10 reps - 5 sec hold - Supine Figure 4 Piriformis Stretch  - 1 x daily - 7 x weekly - 2-3 reps - 30 sec hold - Supine Piriformis Stretch with Leg Straight  - 1 x daily - 7 x weekly - 2-3 reps - 30 sec hold - Supine 90/90 Abdominal Bracing  - 1 x daily - 7 x weekly - 2-3 reps - 30 sec hold   ASSESSMENT:   CLINICAL IMPRESSION: Pt was able to complete all prescribed exercises with no adverse effect. Therapy today focused on improving core and proximal hip strength/muscle length in order to decrease pain and improve mobility. Pt is progressing as expected thus far and will continue to be seen and progressed as able.      OBJECTIVE IMPAIRMENTS decreased activity tolerance, decreased endurance, decreased ROM, decreased strength, impaired flexibility, and pain.  ACTIVITY  LIMITATIONS carrying, lifting, bending, standing, squatting, and sleeping   PARTICIPATION LIMITATIONS: cleaning, community activity, and yard work   PERSONAL FACTORS Time since onset of injury/illness/exacerbation and 1-2 comorbidities: Thoracic aortic aneurysm; HTN  are also affecting patient's functional outcome.      GOALS: Goals reviewed with patient? No   SHORT TERM GOALS: Target date: 07/26/2021   Pt will be compliant and knowledgeable with initial HEP for improved comfort and carryover Baseline: initial HEP given  Goal status: INITIAL   2.  Pt will self report lower back and L LE pain no greater than 6/10 for improved comfort and functional ability Baseline: 8/10 at worst Goal status: INITIAL   LONG TERM GOALS: Target date: 08/30/2021   Pt will self report lower back and L LE pain no greater than 2/10 for improved comfort and functional ability Baseline: 8/10 at worst Goal status: INITIAL   2.  Pt will be able to sleep throughout the night not affected by pain for improved comfort Baseline: unable  Goal status: INITIAL   3.  Pt will improve FOTO function score to no less than 77% as proxy for functional improvement Baseline: 72% function Goal status: INITIAL   4.  Pt will be able to hold supine bridge for 60" for improved core endurance Baseline: 30 sec Goal status: INITIAL   PLAN: PT FREQUENCY: 1-2x/week   PT DURATION: 8 weeks   PLANNED INTERVENTIONS: Therapeutic exercises, Therapeutic activity, Neuromuscular re-education, Balance training, Gait training, Patient/Family education, Joint mobilization, Aquatic Therapy, Dry Needling, Electrical stimulation, Cryotherapy, Moist heat, Vasopneumatic device, Manual therapy, and Re-evaluation.   PLAN FOR NEXT SESSION: assess HEP response, L hip flexor stretching, improve neutral spine core strength   Ward Chatters, PT 07/12/2021, 8:23 AM

## 2021-07-16 NOTE — Therapy (Unsigned)
OUTPATIENT PHYSICAL THERAPY TREATMENT NOTE   Patient Name: Jerry Velazquez MRN: [id number] DOB:1945-05-16, 76 y.o., male Today's Date: 07/16/2021  PCP: Susy Frizzle, MD REFERRING PROVIDER: Susy Frizzle, MD  END OF SESSION:   Past Medical History:  Diagnosis Date   Aortic aneurysm [facility])    Aortic insufficiency due to bicuspid aortic valve    Aortic stenosis due to bicuspid aortic valve    Atrial flutter (HCC)    Bicuspid aortic valve    GERD (gastroesophageal reflux disease)    past hx- stopped alcohol and no more issues    NSVT (nonsustained ventricular tachycardia) (HCC)    Paroxysmal atrial fibrillation (HCC)    PVC's (premature ventricular contractions)    RBBB    Past Surgical History:  Procedure Laterality Date   COLONOSCOPY     12 yrs ago [facility]- normal per pt    CYST EXCISION     left knee    CYST EXCISION     tongue   Patient Active Problem List   Diagnosis Date Noted   Paroxysmal atrial fibrillation ([place]) 11/03/2018   Essential hypertension 03/13/2018   Thoracic aortic aneurysm ([place]) 12/10/2017   Bicuspid aortic valve 12/10/2017   Dyslipidemia 09/18/2017   Paroxysmal supraventricular tachycardia ([place]) 09/18/2017   Former pipe smoker 09/18/2017   Aortic insufficiency 09/09/2017   Palpitations 09/04/2014    REFERRING DIAG:  M54.40,G89.29 (ICD-10-CM) - Chronic midline low back pain with sciatica, sciatica laterality unspecified  THERAPY DIAG:  No diagnosis found.  Rationale for Evaluation and Treatment Rehabilitation  PERTINENT HISTORY: Thoracic aortic aneurysm; HTN   PRECAUTIONS: No Lifting over 30# secondary to Thoracic Aortic Aneurysm   SUBJECTIVE: Pt states that he has been sleeping on an air mattress which has made a huge difference in his pain. He states that he is feeling much better with minimal pain. Pt reports compliance with his HEP.  Pt reports that something new that he has noted is a pain that appears in his quad when trying to  lift his leg to shift and when getting out of his car.   PAIN:  Are you having pain?  Yes: NPRS scale: 0/10 (5/10 at worst) Pain location: L side of lower back, L LE Pain description: dull ache, sore Aggravating factors: prolonged sitting/standing Relieving factors: movement, positioning  OBJECTIVE: (objective measures completed at initial evaluation unless otherwise dated)  PATIENT SURVEYS:  FOTO: 72% function; 77% predicted   COGNITION:           Overall cognitive status: Within functional limits for tasks assessed                 SENSATION: WFL   MUSCLE LENGTH: Thomas test: Right (-) deg; Left (+) deg   POSTURE: rounded shoulders and forward head   PALPATION: TTP to L lumbar paraspinals, L gluteals   LOWER EXTREMITY MMT:     MMT Right 07/05/2021  Left 07/05/2021   Hip flexion       Hip extension      Hip abduction      Hip adduction      Hip external rotation      Hip internal rotation      Knee extension      Knee flexion      Ankle dorsiflexion       Ankle plantarflexion      Ankle inversion      Ankle eversion      Grossly 5/5 5/5  (Blank rows = not  tested)   LUMBAR SPECIAL TESTS:  Straight leg raise test: Positive and Slump test: Positive   FUNCTIONAL TESTS:  30 Second Sit to Stand: 16 reps - increased pain Bridge: 30 sec   GAIT: Distance walked: 55f Assistive device utilized: None Level of assistance: Complete Independence Comments: trunked flexed   TODAY'S TREATMENT  [facility] Adult PT Treatment:                                                DATE: 07/11/2021 Therapeutic Exercise: Recumbent bike 5 min level 3.  Modified thomas stretch x 60" - with PT overpressure to stretch hip flexor's.  SAQ x10 Supine PPT x 5 - 5" hold Bridge 2x10  Supine fig 4 stretch L 2x30" Standing hip abd/ext 2x10 30# Standing L hip flexion to onset of pain with leg weight.  Manual Therapy:  Passive stretching to hip flexors, hip abductors.  Hip abductor mob with  belt.   [facility]                                                DATE: 07/05/2021 Therapeutic Exercise: Seated sciatic nerve glide x 10 L Modified thomas stretch x 60" L Bridge x 10  Supine PPT x 10 - 5" hold   PATIENT EDUCATION:  Education details: eval findings, FOTO, HEP, POC Person educated: Patient Education method: Explanation, Demonstration, and Handouts Education comprehension: verbalized understanding and returned demonstration     HOME EXERCISE PROGRAM: Access Code: [email or web address]: [email or web address] Date: 07/11/2021 Prepared by: DOctavio Manns Exercises - Seated Sciatic Tensioner  - 1 x daily - 7 x weekly - 2 sets - 10 reps - Standing Sciatic Nerve Mobilization on Step  - 1 x daily - 7 x weekly - 2 sets - 10 reps - Modified Thomas Stretch  - 1 x daily - 7 x weekly - 2-3 reps - 60 seconds hold - Supine Bridge  - 1 x daily - 7 x weekly - 3 sets - 10 reps - 3 sec hold - Supine Posterior Pelvic Tilt  - 1 x daily - 7 x weekly - 3 sets - 10 reps - 5 sec hold - Supine Figure 4 Piriformis Stretch  - 1 x daily - 7 x weekly - 2-3 reps - 30 sec hold - Supine Piriformis Stretch with Leg Straight  - 1 x daily - 7 x weekly - 2-3 reps - 30 sec hold - Supine 90/90 Abdominal Bracing  - 1 x daily - 7 x weekly - 2-3 reps - 30 sec hold   ASSESSMENT:   CLINICAL IMPRESSION: Pt was able to complete all prescribed exercises with noted pain with hip flexor activities only. Pt with new onset of pain noted with activation of hip flexors. Educated pt on importance of wori=king through pain free range. Therapy today continued to focus on improving proximal hip strength/muscle length in order to decrease pain and improve mobility. Pt is progressing as expected thus far and will continue to be seen and progressed as able.      OBJECTIVE IMPAIRMENTS decreased activity tolerance, decreased endurance, decreased ROM, decreased strength, impaired flexibility, and pain.     ACTIVITY LIMITATIONS carrying, lifting, bending, standing, squatting, and  sleeping   PARTICIPATION LIMITATIONS: cleaning, community activity, and yard work   PERSONAL FACTORS Time since onset of injury/illness/exacerbation and 1-2 comorbidities: Thoracic aortic aneurysm; HTN  are also affecting patient's functional outcome.      GOALS: Goals reviewed with patient? No   SHORT TERM GOALS: Target date: 07/26/2021   Pt will be compliant and knowledgeable with initial HEP for improved comfort and carryover Baseline: initial HEP given  Goal status: INITIAL   2.  Pt will self report lower back and L LE pain no greater than 6/10 for improved comfort and functional ability Baseline: 8/10 at worst Goal status: INITIAL   LONG TERM GOALS: Target date: 08/30/2021   Pt will self report lower back and L LE pain no greater than 2/10 for improved comfort and functional ability Baseline: 8/10 at worst Goal status: INITIAL   2.  Pt will be able to sleep throughout the night not affected by pain for improved comfort Baseline: unable  Goal status: INITIAL   3.  Pt will improve FOTO function score to no less than 77% as proxy for functional improvement Baseline: 72% function Goal status: INITIAL   4.  Pt will be able to hold supine bridge for 60" for improved core endurance Baseline: 30 sec Goal status: INITIAL   PLAN: PT FREQUENCY: 1-2x/week   PT DURATION: 8 weeks   PLANNED INTERVENTIONS: Therapeutic exercises, Therapeutic activity, Neuromuscular re-education, Balance training, Gait training, Patient/Family education, Joint mobilization, Aquatic Therapy, Dry Needling, Electrical stimulation, Cryotherapy, Moist heat, Vasopneumatic device, Manual therapy, and Re-evaluation.   PLAN FOR NEXT SESSION: assess HEP response, L hip flexor stretching, improve neutral spine core strength   Lynden Ang, PT 07/16/2021, 3:00 PM

## 2021-07-19 ENCOUNTER — Other Ambulatory Visit: Payer: Self-pay | Admitting: Surgery

## 2021-07-19 ENCOUNTER — Ambulatory Visit: Payer: PPO | Attending: Family Medicine | Admitting: Physical Therapy

## 2021-07-19 ENCOUNTER — Encounter: Payer: Self-pay | Admitting: Physical Therapy

## 2021-07-19 DIAGNOSIS — I712 Thoracic aortic aneurysm, without rupture, unspecified: Secondary | ICD-10-CM

## 2021-07-19 DIAGNOSIS — R2689 Other abnormalities of gait and mobility: Secondary | ICD-10-CM | POA: Insufficient documentation

## 2021-07-19 DIAGNOSIS — M5459 Other low back pain: Secondary | ICD-10-CM | POA: Insufficient documentation

## 2021-07-19 DIAGNOSIS — M6281 Muscle weakness (generalized): Secondary | ICD-10-CM | POA: Diagnosis not present

## 2021-07-25 ENCOUNTER — Ambulatory Visit: Payer: PPO

## 2021-07-25 DIAGNOSIS — M6281 Muscle weakness (generalized): Secondary | ICD-10-CM

## 2021-07-25 DIAGNOSIS — M5459 Other low back pain: Secondary | ICD-10-CM

## 2021-07-25 DIAGNOSIS — R2689 Other abnormalities of gait and mobility: Secondary | ICD-10-CM

## 2021-07-25 NOTE — Therapy (Addendum)
OUTPATIENT PHYSICAL THERAPY TREATMENT NOTE/DISCHARGE  PHYSICAL THERAPY DISCHARGE SUMMARY  Visits from Start of Care: 4  Current functional level related to goals / functional outcomes: See goals/objective    Remaining deficits: See goals/objective   Education / Equipment: HEP   Patient agrees to discharge. Patient goals were mostly met. Patient is being discharged due to being pleased with the current functional level.  Patient Name: Jerry Velazquez MRN: [id number] DOB:1945-04-06, 76 y.o., male Today's Date: 07/25/2021  PCP: Susy Frizzle, MD REFERRING PROVIDER: Susy Frizzle, MD  END OF SESSION:   PT End of Session - 07/25/21 0957     Visit Number 4    Number of Visits 17    Date for PT Re-Evaluation 08/30/21    Authorization Type [employer] Medicare    PT Start Time 1000    PT Stop Time 3428    PT Time Calculation (min) 38 min    Activity Tolerance Patient tolerated treatment well;Patient limited by pain    Behavior During Therapy WFL for tasks assessed/performed            Past Medical History:  Diagnosis Date   Aortic aneurysm ([place])    Aortic insufficiency due to bicuspid aortic valve    Aortic stenosis due to bicuspid aortic valve    Atrial flutter (HCC)    Bicuspid aortic valve    GERD (gastroesophageal reflux disease)    past hx- stopped alcohol and no more issues    NSVT (nonsustained ventricular tachycardia) (HCC)    Paroxysmal atrial fibrillation (HCC)    PVC's (premature ventricular contractions)    RBBB    Past Surgical History:  Procedure Laterality Date   COLONOSCOPY     12 yrs ago [facility]- normal per pt    CYST EXCISION     left knee    CYST EXCISION     tongue   Patient Active Problem List   Diagnosis Date Noted   Paroxysmal atrial fibrillation ([place]) 11/03/2018   Essential hypertension 03/13/2018   Thoracic aortic aneurysm ([place]) 12/10/2017   Bicuspid aortic valve 12/10/2017   Dyslipidemia 09/18/2017   Paroxysmal supraventricular  tachycardia ([place]) 09/18/2017   Former pipe smoker 09/18/2017   Aortic insufficiency 09/09/2017   Palpitations 09/04/2014    REFERRING DIAG:  M54.40,G89.29 (ICD-10-CM) - Chronic midline low back pain with sciatica, sciatica laterality unspecified  THERAPY DIAG:  Other low back pain  Muscle weakness (generalized)  Other abnormalities of gait and mobility  Rationale for Evaluation and Treatment Rehabilitation  PERTINENT HISTORY: Thoracic aortic aneurysm; HTN   PRECAUTIONS: No Lifting over 30# secondary to Thoracic Aortic Aneurysm   SUBJECTIVE: Pt presents to   PAIN:  Are you having pain?  Yes: NPRS scale: 0/10 (5/10 at worst) Pain location: L side of lower back, L LE Pain description: dull ache, sore Aggravating factors: prolonged sitting/standing Relieving factors: movement, positioning  OBJECTIVE: (objective measures completed at initial evaluation unless otherwise dated)  PATIENT SURVEYS:  FOTO: 72% function; 77% predicted   COGNITION:           Overall cognitive status: Within functional limits for tasks assessed                 SENSATION: WFL   MUSCLE LENGTH: Thomas test: Right (-) deg; Left (+) deg   POSTURE: rounded shoulders and forward head   PALPATION: TTP to L lumbar paraspinals, L gluteals   LOWER EXTREMITY MMT:     MMT Right 07/05/2021  Left 07/05/2021  Hip flexion       Hip extension      Hip abduction      Hip adduction      Hip external rotation      Hip internal rotation      Knee extension      Knee flexion      Ankle dorsiflexion       Ankle plantarflexion      Ankle inversion      Ankle eversion      Grossly 5/5 5/5  (Blank rows = not tested)   LUMBAR SPECIAL TESTS:  Straight leg raise test: Positive and Slump test: Positive   FUNCTIONAL TESTS:  30 Second Sit to Stand: 16 reps - increased pain Bridge: 30 sec   GAIT: Distance walked: 73f Assistive device utilized: None Level of assistance: Complete  Independence Comments: trunked flexed   TODAY'S TREATMENT  [facility] Adult PT Treatment:                                                DATE: 07/25/2021 Therapeutic Exercise: Recumbent bike 4 min level 3 while taking subjective  Supine figure 4 stretch 2x30" L Supine SLR 2x10 2.5# S/L clamshell 2x15 GTB Bridge with ball squeeze 2x10  Modified thomas stretch x 60" - with PT overpressure to stretch hip flexor's.  SAQ x10 Supine PPT x 5 - 5" hold Bridge 2x10  Supine fig 4 stretch L 2x30" Standing hip abd/ext 2x10 30# Standing L hip flexion to onset of pain with leg weight.  Manual Therapy:  S/L hip ext mobilization grade II  [facility] Adult PT Treatment:                                                DATE: 07/11/2021 Therapeutic Exercise: Recumbent bike 5 min level 3.  Modified thomas stretch x 60" - with PT overpressure to stretch hip flexor's.  SAQ x10 Supine PPT x 5 - 5" hold Bridge 2x10  Supine fig 4 stretch L 2x30" Standing hip abd/ext 2x10 30# Standing L hip flexion to onset of pain with leg weight.  Manual Therapy:  Passive stretching to hip flexors, hip abductors.  Hip abductor mob with belt.   [facility]                                                DATE: 07/05/2021 Therapeutic Exercise: Seated sciatic nerve glide x 10 L Modified thomas stretch x 60" L Bridge x 10  Supine PPT x 10 - 5" hold   PATIENT EDUCATION:  Education details: eval findings, FOTO, HEP, POC Person educated: Patient Education method: Explanation, Demonstration, and Handouts Education comprehension: verbalized understanding and returned demonstration     HOME EXERCISE PROGRAM: Access Code: [email or web address]: [email or web address] Date: 07/25/2021 Prepared by: DOctavio Manns Exercises - Seated Sciatic Tensioner  - 1 x daily - 7 x weekly - 2 sets - 10 reps - Standing Sciatic Nerve Mobilization on Step  - 1 x daily - 7 x weekly - 2 sets - 10 reps - Modified [place] -  1 x  daily - 7 x weekly - 2-3 reps - 60 seconds hold - Supine Bridge with Mini Swiss Ball Between Knees  - 1 x daily - 7 x weekly - 2 sets - 10 reps - 3 sec hold - Supine Posterior Pelvic Tilt  - 1 x daily - 7 x weekly - 3 sets - 10 reps - 5 sec hold - Supine Figure 4 Piriformis Stretch  - 1 x daily - 7 x weekly - 2-3 reps - 30 sec hold - Supine Piriformis Stretch with Leg Straight  - 1 x daily - 7 x weekly - 2-3 reps - 30 sec hold - Supine 90/90 Abdominal Bracing  - 1 x daily - 7 x weekly - 2-3 reps - 30 sec hold - Standing Hip Abduction with Resistance at Ankles and Counter Support  - 1 x daily - 7 x weekly - 2-3 sets - 10 reps - green theraband hold - Standing Hip Extension with Resistance at Ankles and Counter Support  - 1 x daily - 7 x weekly - 2-3 sets - 10 reps - green theraband hold   ASSESSMENT:   CLINICAL IMPRESSION: Pt was again able to complete all prescribed exercises with no adverse effect or increase in pain. Therapy focused on improving proximal hip strength and mobility in order to decrease pain and improve function. HEP updated for continued strengthening, will continue to progress as able per POC.      OBJECTIVE IMPAIRMENTS decreased activity tolerance, decreased endurance, decreased ROM, decreased strength, impaired flexibility, and pain.    ACTIVITY LIMITATIONS carrying, lifting, bending, standing, squatting, and sleeping   PARTICIPATION LIMITATIONS: cleaning, community activity, and yard work   PERSONAL FACTORS Time since onset of injury/illness/exacerbation and 1-2 comorbidities: Thoracic aortic aneurysm; HTN  are also affecting patient's functional outcome.      GOALS: Goals reviewed with patient? No   SHORT TERM GOALS: Target date: 07/26/2021   Pt will be compliant and knowledgeable with initial HEP for improved comfort and carryover Baseline: initial HEP given  Goal status: INITIAL   2.  Pt will self report lower back and L LE pain no greater than 6/10 for  improved comfort and functional ability Baseline: 8/10 at worst Goal status: INITIAL   LONG TERM GOALS: Target date: 08/30/2021   Pt will self report lower back and L LE pain no greater than 2/10 for improved comfort and functional ability Baseline: 8/10 at worst Goal status: INITIAL   2.  Pt will be able to sleep throughout the night not affected by pain for improved comfort Baseline: unable  Goal status: INITIAL   3.  Pt will improve FOTO function score to no less than 77% as proxy for functional improvement Baseline: 72% function Goal status: INITIAL   4.  Pt will be able to hold supine bridge for 60" for improved core endurance Baseline: 30 sec Goal status: INITIAL   PLAN: PT FREQUENCY: 1-2x/week   PT DURATION: 8 weeks   PLANNED INTERVENTIONS: Therapeutic exercises, Therapeutic activity, Neuromuscular re-education, Balance training, Gait training, Patient/Family education, Joint mobilization, Aquatic Therapy, Dry Needling, Electrical stimulation, Cryotherapy, Moist heat, Vasopneumatic device, Manual therapy, and Re-evaluation.   PLAN FOR NEXT SESSION: assess HEP response, L hip flexor stretching, improve neutral spine core strength   Ward Chatters, PT 07/25/2021, 10:38 AM

## 2021-07-30 DIAGNOSIS — C4441 Basal cell carcinoma of skin of scalp and neck: Secondary | ICD-10-CM | POA: Diagnosis not present

## 2021-08-01 ENCOUNTER — Other Ambulatory Visit: Payer: Self-pay | Admitting: Cardiovascular Disease

## 2021-08-02 ENCOUNTER — Encounter: Payer: Self-pay | Admitting: Family Medicine

## 2021-08-02 MED ORDER — GABAPENTIN 300 MG PO CAPS: 300.0000 mg | ORAL_CAPSULE | Freq: Every day | ORAL | 5 refills | Status: DC

## 2021-08-02 NOTE — Telephone Encounter (Signed)
Prescription refill request for Eliquis received. Indication: PAF Last office visit: 10/25/20  Adora Fridge MD Scr: 0.69 on 05/25/20 Age: 76 Weight: 88kg  Based on above findings Eliquis '5mg'$  twice daily is the appropriate dose.  Pt has appt with Dr Gwenlyn Found on 09/26/21.  Requested labs be done at that time as they are past due.  Refill approved.

## 2021-08-06 ENCOUNTER — Telehealth: Payer: Self-pay

## 2021-08-06 NOTE — Telephone Encounter (Signed)
Pt states he is taking three, 300 mg capsules per day. Pt needs a new prescription sent [facility] on [address] reflecting that amount. New Rx sent to [facility] on [address] as ordered per Dr. Dennard Schaumann. Mjp,lpn

## 2021-08-07 ENCOUNTER — Other Ambulatory Visit: Payer: Self-pay | Admitting: Family Medicine

## 2021-08-07 MED ORDER — GABAPENTIN 300 MG PO CAPS: 300.0000 mg | ORAL_CAPSULE | Freq: Three times a day (TID) | ORAL | 5 refills | Status: DC

## 2021-08-20 ENCOUNTER — Ambulatory Visit (HOSPITAL_COMMUNITY): Payer: PPO | Attending: Cardiovascular Disease

## 2021-08-20 ENCOUNTER — Ambulatory Visit: Payer: PPO | Admitting: Physical Therapy

## 2021-08-20 DIAGNOSIS — I351 Nonrheumatic aortic (valve) insufficiency: Secondary | ICD-10-CM | POA: Insufficient documentation

## 2021-08-20 LAB — ECHOCARDIOGRAM COMPLETE
AR max vel: 1.26 cm2
AV Area VTI: 1.33 cm2
AV Area mean vel: 1.29 cm2
AV Mean grad: 26 mmHg
AV Peak grad: 39.7 mmHg
Ao pk vel: 3.15 m/s
Area-P 1/2: 2.34 cm2
P 1/2 time: 472 msec
S' Lateral: 3 cm

## 2021-08-27 IMAGING — CT CT ANGIO CHEST
2 of 6 series · 13 of 36 positions shown · IV contrast (iopamidol)
Comparison: CTA chest dated February 09, 2020.

CLINICAL DATA: Thoracic aortic aneurysm follow-up.

EXAM:
CT ANGIOGRAPHY CHEST WITH CONTRAST
TECHNIQUE: Multidetector CT imaging of the chest was performed using the
standard protocol during bolus administration of intravenous
contrast. Multiplanar CT image reconstructions and MIPs were
obtained to evaluate the vascular anatomy.
Creatinine was obtained on site at [HOSPITAL] at [REDACTED].
Results: Creatinine 0.7 mg/dL.
CONTRAST:  75mL NUGKDY-Q8S IOPAMIDOL (NUGKDY-Q8S) INJECTION 76%

[Series 5: cta thorax 2.00 bv36 s3 axial arterial · axial · arterial · 0.72mm/px · z∈[+1353,+1681]mm · 12 of 194 slices shown]
[im 15/194  lung]
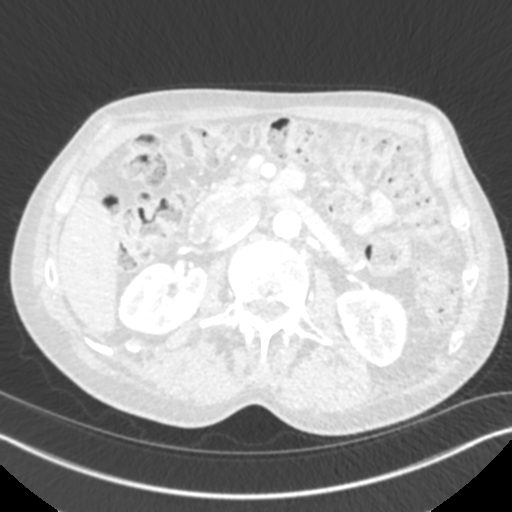
[im 30/194  mediastinal]
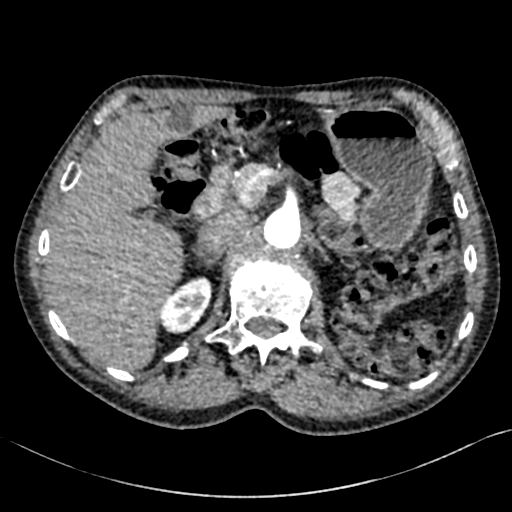
[im 45/194  lung]
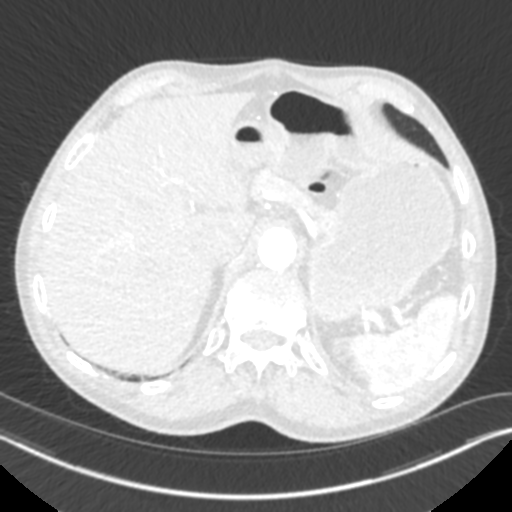
[im 60/194  mediastinal]
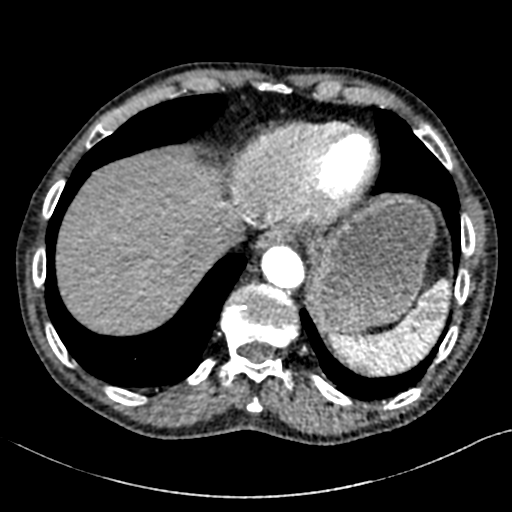
[im 75/194  lung]
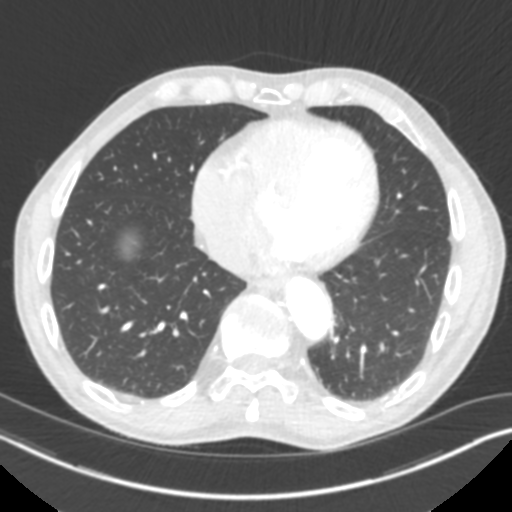
[im 90/194  mediastinal]
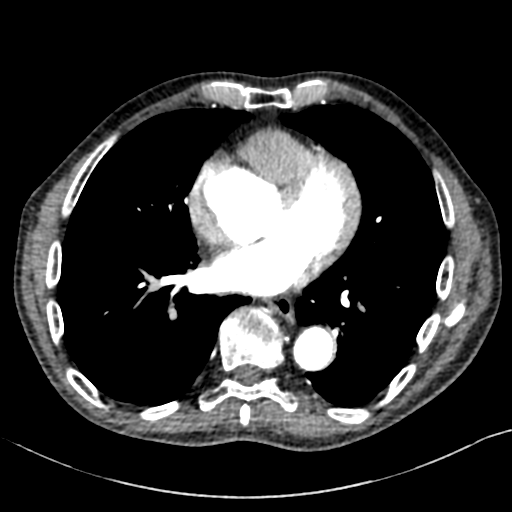
[im 104/194  lung]
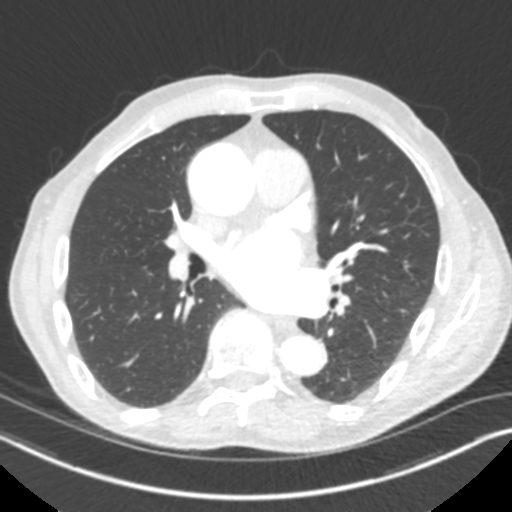
[im 119/194  mediastinal]
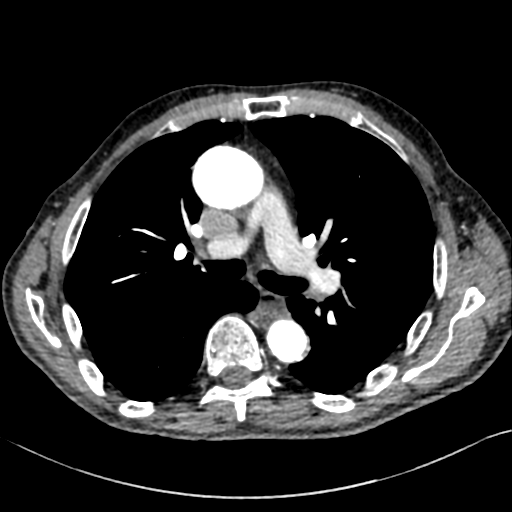
[im 134/194  lung]
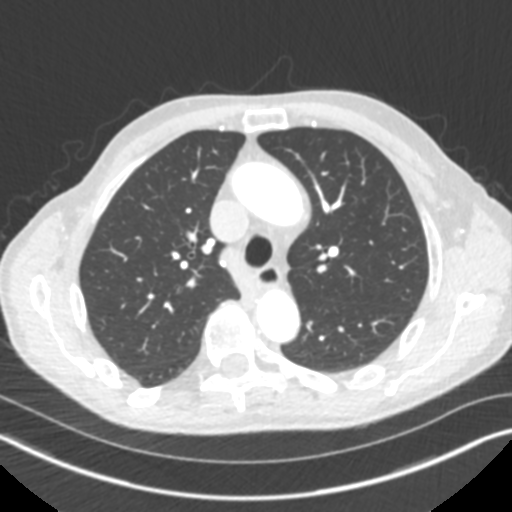
[im 149/194  mediastinal]
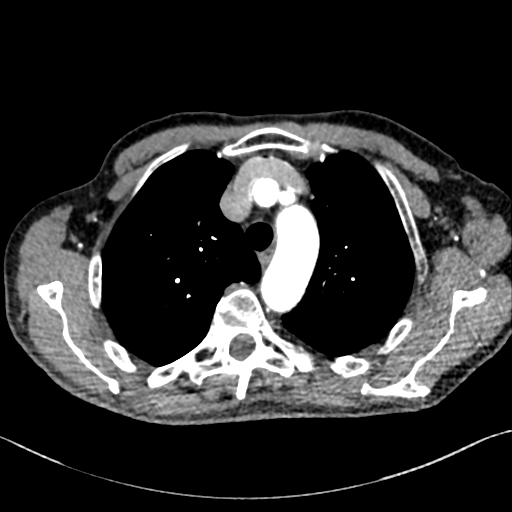
[im 164/194  lung]
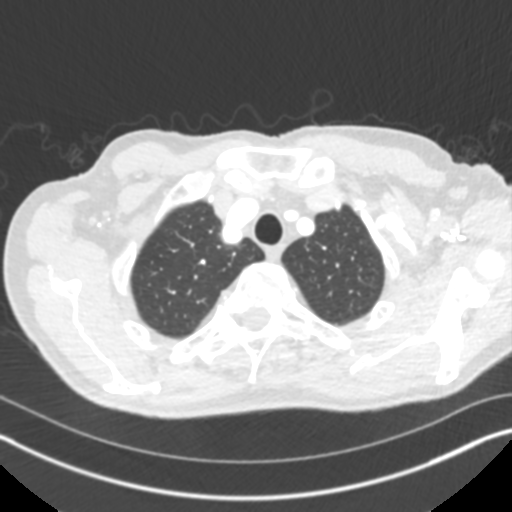
[im 179/194  mediastinal]
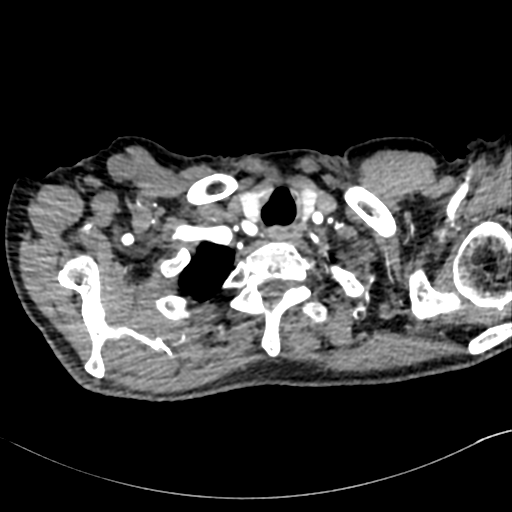

[Series 10: cta thorax 2.00 bv36 s3 cor st · coronal · 0.72mm/px · 1 of 184 slices shown]
[im 92/184  mediastinal]
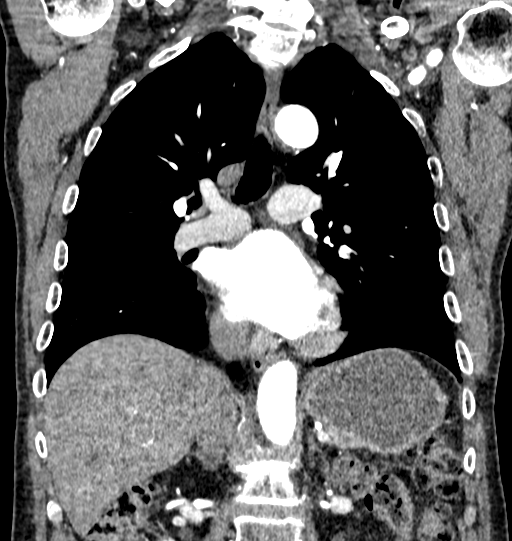

[13 of 36 positions shown; findings below may reference images not displayed]

FINDINGS: Cardiovascular: Stable ascending thoracic aortic aneurysm measuring
up to 4.8 cm. No dissection. Coronary, aortic arch, and branch
vessel atherosclerotic vascular disease. Prominent aortic valve
calcification. Normal heart size. No pericardial effusion.

Mediastinum/Nodes: No enlarged mediastinal, hilar, or axillary lymph
nodes. Thyroid gland, trachea, and esophagus demonstrate no
significant findings.

Lungs/Pleura: Lungs are clear. No pleural effusion or pneumothorax.

Upper Abdomen: No acute abnormality.

Musculoskeletal: No chest wall abnormality. No acute or significant
osseous findings. Unchanged dextroscoliosis.

Review of the MIP images confirms the above findings.
IMPRESSION: 1. Stable 4.8 cm ascending thoracic aortic aneurysm.
2. Aortic Atherosclerosis ([id number]).

## 2021-09-12 ENCOUNTER — Encounter: Payer: Self-pay | Admitting: Surgery

## 2021-09-12 ENCOUNTER — Ambulatory Visit: Payer: PPO | Admitting: Surgery

## 2021-09-12 ENCOUNTER — Ambulatory Visit
Admission: RE | Admit: 2021-09-12 | Discharge: 2021-09-12 | Disposition: A | Discharge status: Home / Self Care | Payer: PPO | Source: Ambulatory Visit | Attending: Surgery | Admitting: Surgery

## 2021-09-12 VITALS — BP 160/60 | HR 64 | Resp 20 | Ht 73.0 in | Wt 195.0 lb

## 2021-09-12 DIAGNOSIS — I712 Thoracic aortic aneurysm, without rupture, unspecified: Secondary | ICD-10-CM | POA: Diagnosis not present

## 2021-09-12 DIAGNOSIS — J8410 Pulmonary fibrosis, unspecified: Secondary | ICD-10-CM | POA: Diagnosis not present

## 2021-09-12 DIAGNOSIS — I358 Other nonrheumatic aortic valve disorders: Secondary | ICD-10-CM | POA: Diagnosis not present

## 2021-09-12 DIAGNOSIS — I2510 Atherosclerotic heart disease of native coronary artery without angina pectoris: Secondary | ICD-10-CM | POA: Diagnosis not present

## 2021-09-12 MED ORDER — IOPAMIDOL (ISOVUE-370) INJECTION 76%
75.0000 mL | Freq: Once | INTRAVENOUS | Status: AC | PRN
  Administered 2021-09-12: 75 mL via INTRAVENOUS

## 2021-09-12 NOTE — Progress Notes (Signed)
    HPI: ***  Current Outpatient Medications  Medication Sig Dispense Refill   apixaban (ELIQUIS) 5 MG TABS tablet TAKE 1 TABLET BY MOUTH TWICE A DAY 180 tablet 0   atorvastatin (LIPITOR) 20 MG tablet TAKE 1 TABLET BY MOUTH EVERY DAY 90 tablet 3   cyclobenzaprine (FLEXERIL) 10 MG tablet Take 1 tablet (10 mg total) by mouth 3 (three) times daily as needed for muscle spasms. 30 tablet 0   gabapentin (NEURONTIN) 300 MG capsule Take 1 capsule (300 mg total) by mouth 3 (three) times daily. 90 capsule 5   GLUCOSAMINE HCL PO Take 2,000 mg by mouth daily.     lisinopril (ZESTRIL) 5 MG tablet TAKE 1 TABLET BY MOUTH EVERY DAY 90 tablet 3   Omega-3 Fatty Acids (FISH OIL) 1000 MG CAPS Take 1,000 mg by mouth daily.     PFIZER COVID-19 VAC BIVALENT injection      metoprolol tartrate (LOPRESSOR) 25 MG tablet Take 0.5 tablets (12.5 mg total) by mouth every 6 (six) hours as needed (palpitations). 30 tablet 0   No current facility-administered medications for this visit.     Physical Exam: ***  Diagnostic Tests: ***  Impression: ***  Plan: ***   Gaye Pollack, MD [facility] [phone]

## 2021-09-21 ENCOUNTER — Telehealth: Payer: Self-pay | Admitting: Pharmacist

## 2021-09-21 NOTE — Progress Notes (Signed)
Chronic Care Management Pharmacy Assistant   Name: Jerry Velazquez  MRN: [id number] DOB: May 09, 1945   Reason for Encounter: General Adherence Call    Recent office visits:  07/02/2021 OV (PCP) Susy Frizzle, MD; Patient can continue to take gabapentin 300 mg 3 times a day as needed.  I recommended that we uptitrate this as long as he sees benefit gradually and slowly to a maximum of 900 mg 3 times a day.  I will refer the patient to physical therapy.  If not improving I would recommend an epidural steroid injection  Recent consult visits:  09/12/2021 OV (Cardiothoracic) Gaye Pollack, MD; no medication changes noted.  Hospital visits:  None in previous 6 months  Medications: Outpatient Encounter Medications as of 09/21/2021  Medication Sig Note   apixaban (ELIQUIS) 5 MG TABS tablet TAKE 1 TABLET BY MOUTH TWICE A DAY    atorvastatin (LIPITOR) 20 MG tablet TAKE 1 TABLET BY MOUTH EVERY DAY    cyclobenzaprine (FLEXERIL) 10 MG tablet Take 1 tablet (10 mg total) by mouth 3 (three) times daily as needed for muscle spasms.    gabapentin (NEURONTIN) 300 MG capsule Take 1 capsule (300 mg total) by mouth 3 (three) times daily.    GLUCOSAMINE HCL PO Take 2,000 mg by mouth daily.    lisinopril (ZESTRIL) 5 MG tablet TAKE 1 TABLET BY MOUTH EVERY DAY    metoprolol tartrate (LOPRESSOR) 25 MG tablet Take 0.5 tablets (12.5 mg total) by mouth every 6 (six) hours as needed (palpitations). 01/11/2021: Pt states he only takes as needed due to palpitations.    Omega-3 Fatty Acids (FISH OIL) 1000 MG CAPS Take 1,000 mg by mouth daily.    PFIZER COVID-19 VAC BIVALENT injection     No facility-administered encounter medications on file as of 09/21/2021.   Contacted [facility] for General Review Call   Chart Review:  Have there been any documented new, changed, or discontinued medications since last visit? Yes -  take gabapentin 300 mg 3 times a day as needed.  I recommended that we uptitrate this as  long as he sees benefit gradually and slowly to a maximum of 900 mg 3 times a day Has there been any documented recent hospitalizations or ED visits since last visit with Clinical Pharmacist? No   Adherence Review:  Does the Clinical Pharmacist Assistant have access to adherence rates? Yes Adherence rates for STAR metric medications   Atorvastatin 20 mg last filled 08/25/2021 90 DS Lisinopril 5 mg last filled 07/20/2021 90 DS Does the patient have >5 day gap between last estimated fill dates for any of the above medications or other medication gaps? No Reason for medication gaps: none   Disease State Questions:  Able to connect with Patient? Yes Did patient have any problems with their health recently? No Have you had any admissions or emergency room visits or worsening of your condition(s) since last visit? No Have you had any visits with new specialists or providers since your last visit? No Have you had any new health care problem(s) since your last visit? No Have you run out of any of your medications since you last spoke with clinical pharmacist? No Are there any medications you are not taking as prescribed? No Are you having any issues or side effects with your medications? No Do you have any other health concerns or questions you want to discuss with your Clinical Pharmacist before your next visit? No Are there any health  concerns that you feel we can do a better job addressing? No Are you having any problems with any of the following since the last visit: (select all that apply)  None 12. Any falls since last visit? No 13. Any increased or uncontrolled pain since last visit? No  Patient states he has completed PT. He is now doing the exercises at home. He states he has had much improvement in his sciatica pain since completing PT.    Care Gaps: Medicare Annual Wellness: Completed 01/11/2021 Hemoglobin A1C: none Colonoscopy: Aged out  Future Appointments  Date Time  Provider [place]  09/26/2021 10:30 AM Lorretta Harp, MD [facility] None  12/31/2021 12:30 PM [facility]M PHARMACIST [facility]  01/17/2022 11:15 AM [facility]-NURSE HEALTH ADVISOR [facility]   Star Rating Drugs: Atorvastatin 20 mg last filled 08/25/2021 90 DS Lisinopril 5 mg last filled 07/20/2021 90 DS  April D Calhoun, [place] Pharmacist Assistant [phone]

## 2021-09-26 ENCOUNTER — Ambulatory Visit: Payer: PPO | Attending: Cardiovascular Disease | Admitting: Cardiovascular Disease

## 2021-09-26 ENCOUNTER — Encounter: Payer: Self-pay | Admitting: Cardiovascular Disease

## 2021-09-26 DIAGNOSIS — Q231 Congenital insufficiency of aortic valve: Secondary | ICD-10-CM | POA: Diagnosis not present

## 2021-09-26 DIAGNOSIS — I10 Essential (primary) hypertension: Secondary | ICD-10-CM

## 2021-09-26 DIAGNOSIS — E785 Hyperlipidemia, unspecified: Secondary | ICD-10-CM | POA: Diagnosis not present

## 2021-09-26 DIAGNOSIS — I480 Paroxysmal atrial fibrillation: Secondary | ICD-10-CM | POA: Diagnosis not present

## 2021-09-26 DIAGNOSIS — I7121 Aneurysm of the ascending aorta, without rupture: Secondary | ICD-10-CM

## 2021-09-26 NOTE — Assessment & Plan Note (Signed)
History of PAF maintaining sinus rhythm on Eliquis oral anticoagulation. 

## 2021-09-26 NOTE — Assessment & Plan Note (Signed)
History of essential hypertension a blood pressure measured today at 138/74.  He is on lisinopril and metoprolol.

## 2021-09-26 NOTE — Patient Instructions (Signed)
Medication Instructions:  Your physician recommends that you continue on your current medications as directed. Please refer to the Current Medication list given to you today.  *If you need a refill on your cardiac medications before your next appointment, please call your pharmacy*   Testing/Procedures: Your physician has requested that you have an echocardiogram. Echocardiography is a painless test that uses sound waves to create images of your heart. It provides your doctor with information about the size and shape of your heart and how well your heart's chambers and valves are working. This procedure takes approximately one hour. There are no restrictions for this procedure. To be done in August 2024. This procedure will be done at [facility], 2nd floor   Non-Cardiac CT Angiography (CTA) chest/aorta, is a special type of CT scan that uses a computer to produce multi-dimensional views of major blood vessels throughout the body. In CT angiography, a contrast material is injected through an IV to help visualize the blood vessels. To be done in August 2024. This procedure will be done at [facility], 2nd floor   Follow-Up: At [facility], you and your health needs are our priority.  As part of our continuing mission to provide you with exceptional heart care, we have created designated Provider Care Teams.  These Care Teams include your primary Cardiologist (physician) and Advanced Practice Providers (APPs -  Physician Assistants and Nurse Practitioners) who all work together to provide you with the care you need, when you need it.  We recommend signing up for the patient portal called "MyChart".  Sign up information is provided on this After Visit Summary.  MyChart is used to connect with patients for Virtual Visits (Telemedicine).  Patients are able to view lab/test results, encounter notes, upcoming appointments, etc.  Non-urgent messages can be sent to your  provider as well.   To learn more about what you can do with MyChart, go to [email or web address].    Your next appointment:   12 month(s)  The format for your next appointment:   In Person  Provider:   Quay Burow, MD

## 2021-09-26 NOTE — Assessment & Plan Note (Signed)
History of thoracic aortic aneurysm measuring 48 mm by recent CTA performed 09/12/2021.  This has remained stable.  We will repeat CTA in 1 year.

## 2021-09-26 NOTE — Assessment & Plan Note (Signed)
History of bicuspid aortic stenosis with 2D echo recently performed 08/20/2021 revealing normal LV function with a valve area of 1.33 cm and a peak gradient of 39 mmHg, essentially unchanged from his prior echo.  He is completely asymptomatic.  We will continue to follow this noninvasively by 2D echocardiography.

## 2021-09-26 NOTE — Progress Notes (Signed)
09/26/2021 [name]   1945-11-25  [id number]  Primary Physician Dennard Schaumann Cammie Mcgee, MD Primary Cardiologist: Lorretta Harp MD Lupe Carney, [place]  HPI:  Jerry Velazquez is a 76 y.o.   thin appearing married Caucasian male father of no children who is retired Nurse, mental health.  He was referred by [name], PA-C for cardiovascular evaluation because of aortic insufficiency.    I last  saw him in the office 08/31/2019.  H 10/25/2020 e has no cardiac risk factors.  He was seen because of palpitations with a recent Holter monitor that showed PVCs and short atrial runs.  2D echo performed because of his soft murmur revealed a bicuspid aortic valve with mild stenosis and mild to moderate aortic insufficiency with normal LV size and function.  There was noted mild dilatation of the thoracic aorta.  He also was experiences a quivering sensation in his chest.  He has denied chest pain or shortness of breath in the past.   I referred him to Dr. [name] and Dr. [name] as well.  Ultimately he was begun on Eliquis oral anticoagulation.  A chest CTA was performed 10/31/2017 revealing a 4.9 cm ascending thoracic aortic aneurysm.  This will be followed on an semiannual basis.  He will ultimately need thoracic aortic repair as well as AVR.  It is possible that he would also benefit from a surgical maze procedure during the operation and removal of his left atrial appendage but I will leave this to Dr. [name] to decide.   Since I  saw him a year ago he continues to do well.  I he is completely asymptomatic.  He is very active.  Recent 2D echo performed 08/20/2021 revealed normal LV systolic function, grade 1 diastolic dysfunction, mild to moderate AI, moderate AS (bicuspid aortic valve) with an aortic valve area of 1.33 cm and a peak gradient of 40 mmHg.  This was unchanged from his prior echo.  Recent CTA performed 09/12/2021 revealed his thoracic aorta measured 48 mm, not at threshold for  consideration for replacement.  He is followed by Dr. Vivi Martens office.   Current Meds  Medication Sig   apixaban (ELIQUIS) 5 MG TABS tablet TAKE 1 TABLET BY MOUTH TWICE A DAY   atorvastatin (LIPITOR) 20 MG tablet TAKE 1 TABLET BY MOUTH EVERY DAY   cyclobenzaprine (FLEXERIL) 10 MG tablet Take 1 tablet (10 mg total) by mouth 3 (three) times daily as needed for muscle spasms.   gabapentin (NEURONTIN) 300 MG capsule Take 1 capsule (300 mg total) by mouth 3 (three) times daily.   GLUCOSAMINE HCL PO Take 2,000 mg by mouth daily.   lisinopril (ZESTRIL) 5 MG tablet TAKE 1 TABLET BY MOUTH EVERY DAY   Omega-3 Fatty Acids (FISH OIL) 1000 MG CAPS Take 1,000 mg by mouth daily.   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   Social Determinants of Health   Financial Resource Strain: Low Risk  (01/11/2021)   Overall Financial Resource Strain (CARDIA)    Difficulty of Paying Living Expenses: Not hard at all  Food Insecurity: No Food Insecurity (01/11/2021)   Hunger Vital Sign    Worried About Running Out of Food in the Last Year: Never true    Ran Out of Food in the Last Year: Never true  Transportation Needs: No Transportation Needs (01/11/2021)    PRAPARE - Hydrologist (Medical): No    Lack of Transportation (Non-Medical): No  Physical Activity: Sufficiently Active (01/11/2021)   Exercise Vital Sign    Days of Exercise per Week: 5 days    Minutes of Exercise per Session: 30 min  Stress: No Stress Concern Present (01/11/2021)   [place]    Feeling of Stress : Not at all  Social Connections: [place] (01/11/2021)   Social Connection and Isolation Panel [NHANES]    Frequency of Communication with Friends and Family: More than three times a week    Frequency of Social Gatherings with Friends and Family: More than three times a week    Attends Religious Services: More than 4 times per year    Active Member of [employer] or Organizations: Yes    Attends Music therapist: More than 4 times per year    Marital Status: Married  Human resources officer Violence: Not At Risk (01/11/2021)   Humiliation, Afraid, Rape, and Kick questionnaire    Fear of Current or Ex-Partner: No    Emotionally Abused: No    Physically Abused: No    Sexually Abused: No     Review of Systems: General: negative for chills, fever, night sweats or weight changes.  Cardiovascular: negative for chest pain, dyspnea on exertion, edema, orthopnea, palpitations, paroxysmal nocturnal dyspnea or shortness of breath Dermatological: negative for rash Respiratory: negative for cough or wheezing Urologic: negative for hematuria Abdominal: negative for nausea, vomiting, diarrhea, bright red blood per rectum, melena, or hematemesis Neurologic: negative for visual changes, syncope, or dizziness All other systems reviewed and are otherwise negative except as noted above.    Blood pressure 138/74, pulse (!) 58, height '6\' 1"'$  (1.854 m), weight 199 lb (90.3 kg), SpO2 99 %.  General appearance: alert and no distress Neck: no adenopathy, no JVD, supple, symmetrical,  trachea midline, thyroid not enlarged, symmetric, no tenderness/mass/nodules, and bilateral carotid bruits versus transmitted murmur Lungs: clear to auscultation bilaterally Heart: Soft outflow tract murmur consistent with aortic stenosis Extremities: extremities normal, atraumatic, no cyanosis or edema Pulses: 2+ and symmetric Skin: Skin color, texture, turgor normal. No rashes or lesions Neurologic: Grossly normal  EKG sinus bradycardia 58 with left ventricular perjury V and early repolarization changes with early R wave transition.  I personally reviewed this EKG.  ASSESSMENT AND PLAN:   Dyslipidemia History of dyslipidemia on atorvastatin with lipid profile performed 05/26/2018 revealing a total cholesterol 131, LDL 62 and HDL 58.  Thoracic aortic aneurysm (HCC) History of thoracic aortic aneurysm measuring 48 mm by  recent CTA performed 09/12/2021.  This has remained stable.  We will repeat CTA in 1 year.  Bicuspid aortic valve History of bicuspid aortic stenosis with 2D echo recently performed 08/20/2021 revealing normal LV function with a valve area of 1.33 cm and a peak gradient of 39 mmHg, essentially unchanged from his prior echo.  He is completely asymptomatic.  We will continue to follow this noninvasively by 2D echocardiography.  Essential hypertension History of essential hypertension a blood pressure measured today at 138/74.  He is on lisinopril and metoprolol.  Paroxysmal atrial fibrillation (HCC) History of PAF maintaining sinus rhythm on Eliquis oral anticoagulation.     Lorretta Harp MD FACP,FACC,FAHA, [facility] 09/26/2021 11:04 AM

## 2021-09-26 NOTE — Assessment & Plan Note (Signed)
History of dyslipidemia on atorvastatin with lipid profile performed 05/26/2018 revealing a total cholesterol 131, LDL 62 and HDL 58.

## 2021-10-18 ENCOUNTER — Other Ambulatory Visit: Payer: Self-pay | Admitting: Cardiovascular Disease

## 2021-10-18 NOTE — Telephone Encounter (Signed)
Prescription refill request for Eliquis received. Indication:Afib Last office visit:9/23 HTD:SKAJG labs Age: 76 Weight:90.3 kg  Prescription refilled

## 2021-10-30 DIAGNOSIS — L821 Other seborrheic keratosis: Secondary | ICD-10-CM | POA: Diagnosis not present

## 2021-10-30 DIAGNOSIS — L814 Other melanin hyperpigmentation: Secondary | ICD-10-CM | POA: Diagnosis not present

## 2021-10-30 DIAGNOSIS — D225 Melanocytic nevi of trunk: Secondary | ICD-10-CM | POA: Diagnosis not present

## 2021-10-30 DIAGNOSIS — Z85820 Personal history of malignant melanoma of skin: Secondary | ICD-10-CM | POA: Diagnosis not present

## 2021-10-30 DIAGNOSIS — Z08 Encounter for follow-up examination after completed treatment for malignant neoplasm: Secondary | ICD-10-CM | POA: Diagnosis not present

## 2021-10-30 DIAGNOSIS — Z85828 Personal history of other malignant neoplasm of skin: Secondary | ICD-10-CM | POA: Diagnosis not present

## 2021-10-30 DIAGNOSIS — D485 Neoplasm of uncertain behavior of skin: Secondary | ICD-10-CM | POA: Diagnosis not present

## 2021-10-30 DIAGNOSIS — R208 Other disturbances of skin sensation: Secondary | ICD-10-CM | POA: Diagnosis not present

## 2021-10-30 DIAGNOSIS — L538 Other specified erythematous conditions: Secondary | ICD-10-CM | POA: Diagnosis not present

## 2021-10-30 DIAGNOSIS — L820 Inflamed seborrheic keratosis: Secondary | ICD-10-CM | POA: Diagnosis not present

## 2021-10-30 DIAGNOSIS — L570 Actinic keratosis: Secondary | ICD-10-CM | POA: Diagnosis not present

## 2021-11-19 ENCOUNTER — Telehealth: Payer: Self-pay | Admitting: Family Medicine

## 2021-11-19 MED ORDER — GABAPENTIN 300 MG PO CAPS: 300.0000 mg | ORAL_CAPSULE | Freq: Three times a day (TID) | ORAL | 5 refills | Status: DC

## 2021-11-19 NOTE — Telephone Encounter (Signed)
  Prescription Request  11/19/2021  Is this a "Controlled Substance" medicine? no  LOV: 09/21/2021   What is the name of the medication or equipment?   gabapentin (NEURONTIN) 300 MG capsule   Have you contacted your pharmacy to request a refill? Yes   Which pharmacy would you like this sent to?  [id number]: [phone]: [phone]  Patient notified that their request is being sent to the clinical staff for review and that they should receive a response within 2 business days.   Please advise pharmacist at [phone]

## 2021-12-20 NOTE — Progress Notes (Signed)
Chronic Care Management Pharmacy Note  01/02/2022 Name:  Jerry Velazquez MRN:  [id number] DOB:  [birth year]  Summary PharmD FU visit.  Patient is doing well currently not having a flare of back pain.  He is taking statin and due for a lipid panel to assess efficacy  Recommendations Check lipid panel at upcoming visit.  Plan: FU 6 months  Subjective: Jerry Velazquez is an 76 y.o. year old male who is a primary patient of Pickard, Cammie Mcgee, MD.  The CCM team was consulted for assistance with disease management and care coordination needs.    Engaged with patient by telephone for follow up visit in response to provider referral for pharmacy case management and/or care coordination services.   Consent to Services:  The patient was given the following information about Chronic Care Management services today, agreed to services, and gave verbal consent: 1. CCM service includes personalized support from designated clinical staff supervised by the primary care provider, including individualized plan of care and coordination with other care providers 2. 24/7 contact phone numbers for assistance for urgent and routine care needs. 3. Service will only be billed when office clinical staff spend 20 minutes or more in a month to coordinate care. 4. Only one practitioner may furnish and bill the service in a calendar month. 5.The patient may stop CCM services at any time (effective at the end of the month) by phone call to the office staff. 6. The patient will be responsible for cost sharing (co-pay) of up to 20% of the service fee (after annual deductible is met). Patient agreed to services and consent obtained.  Patient Care Team: Susy Frizzle, MD as PCP - General (Family Medicine) Lorretta Harp, MD as PCP - Cardiology (Cardiology) [name], PA-C as Physician Assistant (Family Medicine) Gala Romney Cristopher Estimable, MD as Consulting Physician (Gastroenterology) [name], [facility] as Pharmacist  (Pharmacist)  Recent office visits:  05/04/21 Dennard Schaumann) - called in [place] for muscle pain.  Has history of lower back pain and tried gabapentin and prednisone in the past.   Recent consult visits:  None   Hospital visits:  None in previous 6 months  Objective:  Lab Results  Component Value Date   CREATININE 0.69 (L) 05/25/2020   BUN 11 05/25/2020   GFRNONAA 93 05/25/2020   GFRAA 108 05/25/2020   NA 138 05/25/2020   K 5.0 05/25/2020   CALCIUM 9.6 05/25/2020   CO2 27 05/25/2020   GLUCOSE 81 05/25/2020    No results found for: "HGBA1C", "FRUCTOSAMINE", "GFR", "MICROALBUR"  Last diabetic Eye exam: No results found for: "HMDIABEYEEXA"  Last diabetic Foot exam: No results found for: "HMDIABFOOTEX"   Lab Results  Component Value Date   CHOL 131 05/25/2020   HDL 58 05/25/2020   LDLCALC 62 05/25/2020   TRIG 37 05/25/2020   CHOLHDL 2.3 05/25/2020       Latest Ref Rng & Units 05/25/2020    8:14 AM 11/25/2019    9:59 AM 09/13/2019    9:40 AM  Hepatic Function  Total Protein 6.1 - 8.1 g/dL 6.8  7.1  6.8   Albumin 3.7 - 4.7 g/dL   4.0   AST 10 - 35 U/L 26  20  39   ALT 9 - 46 U/L 34  23  48   Alk Phosphatase 48 - 121 IU/L   75   Total Bilirubin 0.2 - 1.2 mg/dL 0.7  0.7  0.5   Bilirubin, Direct 0.00 -  0.40 mg/dL   0.13     Lab Results  Component Value Date/Time   TSH 1.47 09/03/2017 09:34 AM       Latest Ref Rng & Units 05/25/2020    8:14 AM 11/25/2019    9:59 AM 08/10/2018    3:29 PM  CBC  WBC 3.8 - 10.8 Thousand/uL 4.6  6.5  5.5   Hemoglobin 13.2 - 17.1 g/dL 13.1  13.2  14.3   Hematocrit 38.5 - 50.0 % 39.5  40.0  43.9   Platelets 140 - 400 Thousand/uL 189  291  218     No results found for: "VD25OH"  Clinical ASCVD: Yes  The 10-year ASCVD risk score (Arnett DK, et al., 2019) is: 31%   Values used to calculate the score:     Age: 49 years     Sex: Male     Is Non-Hispanic African American: No     Diabetic: No     Tobacco smoker: No     Systolic Blood  Pressure: 138 mmHg     Is BP treated: Yes     HDL Cholesterol: 58 mg/dL     Total Cholesterol: 131 mg/dL       01/11/2021   11:20 AM 05/25/2020    8:04 AM 11/25/2019    9:34 AM  Depression screen PHQ 2/9  Decreased Interest 0 0 0  Down, Depressed, Hopeless 0 0 0  PHQ - 2 Score 0 0 0     Social History   Tobacco Use  Smoking Status Former   Types: Pipe   Quit date: 1982   Years since quitting: 41.9  Smokeless Tobacco Never  Tobacco Comments   smoked a pipe at age 1 to 25   BP Readings from Last 3 Encounters:  09/26/21 138/74  09/12/21 (!) 160/60  07/02/21 130/80   Pulse Readings from Last 3 Encounters:  09/26/21 (!) 58  09/12/21 64  07/02/21 64   Wt Readings from Last 3 Encounters:  09/26/21 199 lb (90.3 kg)  09/12/21 195 lb (88.5 kg)  07/02/21 187 lb (84.8 kg)   BMI Readings from Last 3 Encounters:  09/26/21 26.25 kg/m  09/12/21 25.73 kg/m  07/02/21 24.67 kg/m    Assessment/Interventions: Review of patient past medical history, allergies, medications, health status, including review of consultants reports, laboratory and other test data, was performed as part of comprehensive evaluation and provision of chronic care management services.   SDOH:  (Social Determinants of Health) assessments and interventions performed: No, done within the past year Financial Resource Strain: Low Risk  (01/11/2021)   Overall Financial Resource Strain (CARDIA)    Difficulty of Paying Living Expenses: Not hard at all   Food Insecurity: No Food Insecurity (01/11/2021)   Hunger Vital Sign    Worried About Running Out of Food in the Last Year: Never true    Ran Out of Food in the Last Year: Never true    SDOH Interventions    Flowsheet Row Clinical Support from 01/11/2021 in [place] Interventions   Food Insecurity Interventions Intervention Not Indicated  Housing Interventions Intervention Not Indicated  Transportation Interventions Intervention  Not Indicated  Financial Strain Interventions Intervention Not Indicated  Physical Activity Interventions Intervention Not Indicated  Stress Interventions Intervention Not Indicated  Social Connections Interventions Intervention Not Indicated       Financial Resource Strain: Low Risk  (01/11/2021)   Overall Financial Resource Strain (CARDIA)    Difficulty of Paying Living  Expenses: Not hard at all    [place]: No Food Insecurity (01/11/2021)  Housing: Low Risk  (01/11/2021)  Transportation Needs: No Transportation Needs (01/11/2021)  Alcohol Screen: Low Risk  (01/11/2021)  Depression (PHQ2-9): Low Risk  (01/11/2021)  Financial Resource Strain: Low Risk  (01/11/2021)  Physical Activity: Sufficiently Active (01/11/2021)  Social Connections: Socially Integrated (01/11/2021)  Stress: No Stress Concern Present (01/11/2021)  Tobacco Use: Medium Risk (09/26/2021)    [place]  No Known Allergies  Medications Reviewed Today     Reviewed by [name], [facility] (Pharmacist) on 01/02/22 at 1202  Med List Status: <None>   Medication Order Taking? Sig Documenting Provider Last Dose Status Informant  atorvastatin (LIPITOR) 20 MG tablet [id number] Yes TAKE 1 TABLET BY MOUTH EVERY DAY Lorretta Harp, MD Taking Active   cyclobenzaprine (FLEXERIL) 10 MG tablet [id number] Yes Take 1 tablet (10 mg total) by mouth 3 (three) times daily as needed for muscle spasms. Susy Frizzle, MD Taking Active   ELIQUIS 5 MG TABS tablet [id number] Yes TAKE 1 TABLET BY MOUTH TWICE A DAY Lorretta Harp, MD Taking Active   gabapentin (NEURONTIN) 300 MG capsule [id number] Yes Take 1 capsule (300 mg total) by mouth 3 (three) times daily. Susy Frizzle, MD Taking Active   GLUCOSAMINE HCL PO [id number] Yes Take 2,000 mg by mouth daily. [provider] Taking Active Self  lisinopril (ZESTRIL) 5 MG tablet [id number] Yes TAKE 1 TABLET BY MOUTH EVERY DAY Lorretta Harp,  MD Taking Active   metoprolol tartrate (LOPRESSOR) 25 MG tablet [id number]  Take 0.5 tablets (12.5 mg total) by mouth every 6 (six) hours as needed (palpitations). Lorretta Harp, MD  Expired 01/11/21 2359            Med Note (PERDUE, MARY JANE K   Thu Jan 11, 2021 11:20 AM) Pt states he only takes as needed due to palpitations.   Omega-3 Fatty Acids (FISH OIL) 1000 MG CAPS [id number] Yes Take 1,000 mg by mouth daily. [provider] Taking Active Self  PFIZER COVID-19 [facility] BIVALENT injection [id number] Yes  [provider] Taking Active             Patient Active Problem List   Diagnosis Date Noted   Paroxysmal atrial fibrillation ([place]) 11/03/2018   Essential hypertension 03/13/2018   Thoracic aortic aneurysm ([place]) 12/10/2017   Bicuspid aortic valve 12/10/2017   Dyslipidemia 09/18/2017   Paroxysmal supraventricular tachycardia 09/18/2017   Former pipe smoker 09/18/2017   Aortic insufficiency 09/09/2017   Palpitations 09/04/2014    Immunization History  Administered Date(s) Administered   Fluad Quad(high Dose 65+) 09/15/2018   Influenza-Unspecified 10/23/2020   Moderna Sars-Covid-2 Vaccination 03/27/2019, 11/08/2019, 06/05/2020   Pfizer Covid-19 Vaccine Bivalent Booster 56yr & up 10/23/2020   Pneumococcal Polysaccharide-23 09/15/2018   Zoster Recombinat (Shingrix) 07/24/2020    Conditions to be addressed/monitored:  Tachycardia, HTN, Afib,  Dyslipidemia, Back pain  Care Plan : General Pharmacy (Adult)  Updates made by DEdythe Clarity RPH since 01/02/2022 12:00 AM     Problem: Tachycardia, HTN, Afib,  Dyslipidemia   Priority: High  Onset Date: 06/15/2020     Long-Range Goal: Patient-Specific Goal   Start Date: 06/15/2020  Expected End Date: 12/15/2020  Recent Progress: On track  Priority: High  Note:   Current Barriers:  Unable to achieve control of back pain   Pharmacist Clinical Goal(s):  Patient will achieve control of pain as  evidenced by  pain level adhere to prescribed medication regimen as evidenced by fill dates contact provider office for questions/concerns as evidenced notation of same in electronic health record through collaboration with PharmD and provider.   Interventions: 1:1 collaboration with Susy Frizzle, MD regarding development and update of comprehensive plan of care as evidenced by provider attestation and co-signature Inter-disciplinary care team collaboration (see longitudinal plan of care) Comprehensive medication review performed; medication list updated in electronic medical record  Hypertension (BP goal <140/90) -Uncontrolled, not assessed -Current treatment: Lisinopril 66m daily -Medications previously tried: none noted  -Current home readings: not checking -Current dietary habits: well rounded diet -Current exercise habits: walks for about 30 minutes daily on a regular basis -Denies hypotensive/hypertensive symptoms -Educated on BP goals and benefits of medications for prevention of heart attack, stroke and kidney damage; Exercise goal of 150 minutes per week; Importance of home blood pressure monitoring; Symptoms of hypotension and importance of maintaining adequate hydration; -Counseled to monitor BP at home if symptomatic, document, and provide log at future appointments -Recommended to continue current medication  Hyperlipidemia: (LDL goal < 70) 12/20/43f -Controlled, but has not had a lipid panel since early 2022. -Current treatment: Atorvastatin 24mdaily Appropriate, Effective, Safe, Accessible -Medications previously tried: none noted  -Educated on Cholesterol goals;  Benefits of statin for ASCVD risk reduction; Importance of limiting foods high in cholesterol; Tolerating medication well. Current ASCVD 31%. Have asked him to schedule a FU lipid panel with PCP in the upcoming year. He is agreeable to plan CMA to FU in one month to make sure it is scheduled.  Update  06/22/11 LDL excelled at goal < 70. He is due for recheck. Has upcoming visit with Dr. PiDennard Schaumannwould recommend he recheck lipids to ensure control. No changes to meds at this time continue as current.  Atrial Fibrillation/Tachycardia (Goal: prevent stroke and major bleeding) -Controlled, not assessed -Current treatment: Rate control: Metoprolol tartrate 2545mne-half tablet prn palpitations Anticoagulation: Eliquis 5mg81mD -Medications previously tried: none noted -Home BP and HR readings: does not check, controlled in office  -Counseled on increased risk of stroke due to Afib and benefits of anticoagulation for stroke prevention; importance of adherence to anticoagulant exactly as prescribed; bleeding risk associated with Eliquis and importance of self-monitoring for signs/symptoms of bleeding; avoidance of NSAIDs due to increased bleeding risk with anticoagulants;  -He denies any abnormal bleeding or bruising -Takes metoprolol on prn basis, has experienced bradycardia in the past -Recommended to continue current medication Assessed patient finances. Currently Eliquis is affordable  Update 12/15/20 Only occasionally taking metoprolol still.  Eliquis copay is still affordable.  He has not had any chest pain/rapid HR.  BP has remained controlled in office.  Denies abnormal bleeding or bruising. No changes needed at this time, continue current meds.  Back Pain (Goal: Minimize symptom) 01/02/22 -Controlled -Current treatment  Gabapentin 300mg82mly Appropriate, Effective, Safe, Accessible -Medications previously tried: none noted -Right now he has some back pain but not having to use Flexeril as much lately.  Unsure what his triggers are but he has done well lately controlling pain without it. No changes to meds - continue normal follow up.  Update 06/21/21 Recently started on Flexeril for his back pain. It is helping some, however he is still having pain. Still taking a lower dose  of gabapentin.  He is interested in seeing a physical therapist to see if this will do anything in managing his pain. Encouraged his to reach out  for referral from Dr. Dennard Schaumann. Upon checking referrals, looks like one has been placed. Keep FU with Dr. Dennard Schaumann to discuss progress and other alternatives.   Patient Goals/Self-Care Activities Patient will:  - take medications as prescribed check blood pressure periodically, document, and provide at future appointments target a minimum of 150 minutes of moderate intensity exercise weekly  Follow Up Plan: The care management team will reach out to the patient again over the next 180 days.                 Medication Assistance: None required.  Patient affirms current coverage meets needs.  Compliance/Adherence/Medication fill history: Care Gaps: Due for AWV  Star-Rating Drugs: Atorvastatin 20 mg 90 DS 08/25/21 90ds Lisinopril 5 mg 90 DS 10/18/2021 90ds  Patient's preferred pharmacy is:  [id number] [place], [place] [address]Phone: [phone]: [phone]  Uses pill box? Yes Pt endorses 100% compliance  We discussed: Benefits of medication synchronization, packaging and delivery as well as enhanced pharmacist oversight with [employer]. Patient decided to: Continue current medication management strategy  Care Plan and Follow Up Patient Decision:  Patient agrees to Care Plan and Follow-up.  Plan: The care management team will reach out to the patient again over the next 180 days.  CBeverly Milch PharmD Clinical Pharmacist BJonni SangerFamily Medicine ([phone]   Current Barriers:  Unable to achieve control of back pain   Pharmacist Clinical Goal(s):  Patient will achieve control of pain as evidenced by pain level adhere to prescribed medication regimen as evidenced by fill dates contact provider office for  questions/concerns as evidenced notation of same in electronic health record through collaboration with PharmD and provider.   Interventions: 1:1 collaboration with PSusy Frizzle MD regarding development and update of comprehensive plan of care as evidenced by provider attestation and co-signature Inter-disciplinary care team collaboration (see longitudinal plan of care) Comprehensive medication review performed; medication list updated in electronic medical record  Hypertension (BP goal <140/90) -Uncontrolled, not assessed -Current treatment: Lisinopril 569mdaily -Medications previously tried: none noted  -Current home readings: not checking -Current dietary habits: well rounded diet -Current exercise habits: walks for about 30 minutes daily on a regular basis -Denies hypotensive/hypertensive symptoms -Educated on BP goals and benefits of medications for prevention of heart attack, stroke and kidney damage; Exercise goal of 150 minutes per week; Importance of home blood pressure monitoring; Symptoms of hypotension and importance of maintaining adequate hydration; -Counseled to monitor BP at home if symptomatic, document, and provide log at future appointments -Recommended to continue current medication  Hyperlipidemia: (LDL goal < 70) 12/20/12f -Controlled, but has not had a lipid panel since early 2022. -Current treatment: Atorvastatin 2062maily Appropriate, Effective, Safe, Accessible -Medications previously tried: none noted  -Educated on Cholesterol goals;  Benefits of statin for ASCVD risk reduction; Importance of limiting foods high in cholesterol; Tolerating medication well. Current ASCVD 31%. Have asked him to schedule a FU lipid panel with PCP in the upcoming year. He is agreeable to plan CMA to FU in one month to make sure it is scheduled.  Update 06/22/11 LDL excelled at goal < 70. He is due for recheck. Has upcoming visit with Dr. PicDennard Schaumannould recommend he  recheck lipids to ensure control. No changes to meds at this time continue as current.  Atrial Fibrillation/Tachycardia (Goal: prevent stroke and major bleeding) -Controlled, not assessed -Current  treatment: Rate control: Metoprolol tartrate 3m one-half tablet prn palpitations Anticoagulation: Eliquis 568mBID -Medications previously tried: none noted -Home BP and HR readings: does not check, controlled in office  -Counseled on increased risk of stroke due to Afib and benefits of anticoagulation for stroke prevention; importance of adherence to anticoagulant exactly as prescribed; bleeding risk associated with Eliquis and importance of self-monitoring for signs/symptoms of bleeding; avoidance of NSAIDs due to increased bleeding risk with anticoagulants;  -He denies any abnormal bleeding or bruising -Takes metoprolol on prn basis, has experienced bradycardia in the past -Recommended to continue current medication Assessed patient finances. Currently Eliquis is affordable  Update 12/15/20 Only occasionally taking metoprolol still.  Eliquis copay is still affordable.  He has not had any chest pain/rapid HR.  BP has remained controlled in office.  Denies abnormal bleeding or bruising. No changes needed at this time, continue current meds.  Back Pain (Goal: Minimize symptom) 01/02/22 -Controlled -Current treatment  Gabapentin 30055maily Appropriate, Effective, Safe, Accessible -Medications previously tried: none noted -Right now he has some back pain but not having to use Flexeril as much lately.  Unsure what his triggers are but he has done well lately controlling pain without it. No changes to meds - continue normal follow up.  Update 06/21/21 Recently started on Flexeril for his back pain. It is helping some, however he is still having pain. Still taking a lower dose of gabapentin.  He is interested in seeing a physical therapist to see if this will do anything in managing his  pain. Encouraged his to reach out for referral from Dr. PicDennard Schaumannpon checking referrals, looks like one has been placed. Keep FU with Dr. PicDennard Schaumann discuss progress and other alternatives.   Patient Goals/Self-Care Activities Patient will:  - take medications as prescribed check blood pressure periodically, document, and provide at future appointments target a minimum of 150 minutes of moderate intensity exercise weekly  Follow Up Plan: The care management team will reach out to the patient again over the next 180 days.

## 2021-12-31 ENCOUNTER — Ambulatory Visit: Payer: PPO | Admitting: Pharmacist

## 2021-12-31 DIAGNOSIS — E785 Hyperlipidemia, unspecified: Secondary | ICD-10-CM

## 2021-12-31 DIAGNOSIS — M5136 Other intervertebral disc degeneration, lumbar region: Secondary | ICD-10-CM

## 2022-01-02 NOTE — Patient Instructions (Addendum)
Visit Information   Goals Addressed             This Visit's Progress    Track and Manage Heart Rate and Rhythm-Atrial Fibrillation   On track    Timeframe:  Long-Range Goal Priority:  High Start Date:   06/15/20                          Expected End Date:  12/15/20                     Follow Up Date 10/13/20   - make a plan to exercise regularly - make a plan to eat healthy - take medicine as prescribed    Why is this important?   Atrial fibrillation may have no symptoms. Sometimes the symptoms get worse or happen more often.  It is important to keep track of what your symptoms are and when they happen.  A change in symptoms is important to discuss with your doctor or nurse.  Being active and healthy eating will also help you manage your heart condition.     Notes:        Patient Care Plan: General Pharmacy (Adult)     Problem Identified: Tachycardia, HTN, Afib,  Dyslipidemia   Priority: High  Onset Date: 06/15/2020     Long-Range Goal: Patient-Specific Goal   Start Date: 06/15/2020  Expected End Date: 12/15/2020  Recent Progress: On track  Priority: High  Note:   Current Barriers:  Unable to achieve control of back pain   Pharmacist Clinical Goal(s):  Patient will achieve control of pain as evidenced by pain level adhere to prescribed medication regimen as evidenced by fill dates contact provider office for questions/concerns as evidenced notation of same in electronic health record through collaboration with PharmD and provider.   Interventions: 1:1 collaboration with Susy Frizzle, MD regarding development and update of comprehensive plan of care as evidenced by provider attestation and co-signature Inter-disciplinary care team collaboration (see longitudinal plan of care) Comprehensive medication review performed; medication list updated in electronic medical record  Hypertension (BP goal <140/90) -Uncontrolled, not assessed -Current  treatment: Lisinopril '5mg'$  daily -Medications previously tried: none noted  -Current home readings: not checking -Current dietary habits: well rounded diet -Current exercise habits: walks for about 30 minutes daily on a regular basis -Denies hypotensive/hypertensive symptoms -Educated on BP goals and benefits of medications for prevention of heart attack, stroke and kidney damage; Exercise goal of 150 minutes per week; Importance of home blood pressure monitoring; Symptoms of hypotension and importance of maintaining adequate hydration; -Counseled to monitor BP at home if symptomatic, document, and provide log at future appointments -Recommended to continue current medication  Hyperlipidemia: (LDL goal < 70) 12/20/75f -Controlled, but has not had a lipid panel since early 2022. -Current treatment: Atorvastatin '20mg'$  daily Appropriate, Effective, Safe, Accessible -Medications previously tried: none noted  -Educated on Cholesterol goals;  Benefits of statin for ASCVD risk reduction; Importance of limiting foods high in cholesterol; Tolerating medication well. Current ASCVD 31%. Have asked him to schedule a FU lipid panel with PCP in the upcoming year. He is agreeable to plan CMA to FU in one month to make sure it is scheduled.  Update 06/22/11 LDL excelled at goal < 70. He is due for recheck. Has upcoming visit with Dr. Dennard Schaumann, would recommend he recheck lipids to ensure control. No changes to meds at this time continue as current.  Atrial  Fibrillation/Tachycardia (Goal: prevent stroke and major bleeding) -Controlled, not assessed -Current treatment: Rate control: Metoprolol tartrate '25mg'$  one-half tablet prn palpitations Anticoagulation: Eliquis '5mg'$  BID -Medications previously tried: none noted -Home BP and HR readings: does not check, controlled in office  -Counseled on increased risk of stroke due to Afib and benefits of anticoagulation for stroke prevention; importance of  adherence to anticoagulant exactly as prescribed; bleeding risk associated with Eliquis and importance of self-monitoring for signs/symptoms of bleeding; avoidance of NSAIDs due to increased bleeding risk with anticoagulants;  -He denies any abnormal bleeding or bruising -Takes metoprolol on prn basis, has experienced bradycardia in the past -Recommended to continue current medication Assessed patient finances. Currently Eliquis is affordable  Update 12/15/20 Only occasionally taking metoprolol still.  Eliquis copay is still affordable.  He has not had any chest pain/rapid HR.  BP has remained controlled in office.  Denies abnormal bleeding or bruising. No changes needed at this time, continue current meds.  Back Pain (Goal: Minimize symptom) 01/02/22 -Controlled -Current treatment  Gabapentin '300mg'$  daily Appropriate, Effective, Safe, Accessible -Medications previously tried: none noted -Right now he has some back pain but not having to use Flexeril as much lately.  Unsure what his triggers are but he has done well lately controlling pain without it. No changes to meds - continue normal follow up.  Update 06/21/21 Recently started on Flexeril for his back pain. It is helping some, however he is still having pain. Still taking a lower dose of gabapentin.  He is interested in seeing a physical therapist to see if this will do anything in managing his pain. Encouraged his to reach out for referral from Dr. Dennard Schaumann. Upon checking referrals, looks like one has been placed. Keep FU with Dr. Dennard Schaumann to discuss progress and other alternatives.   Patient Goals/Self-Care Activities Patient will:  - take medications as prescribed check blood pressure periodically, document, and provide at future appointments target a minimum of 150 minutes of moderate intensity exercise weekly  Follow Up Plan: The care management team will reach out to the patient again over the next 180 days.               The patient verbalized understanding of instructions, educational materials, and care plan provided today and DECLINED offer to receive copy of patient instructions, educational materials, and care plan.  Telephone follow up appointment with pharmacy team member scheduled for: 1 year  [name], [place], PharmD, [place] Clinical Pharmacist Practitioner [place] [phone]

## 2022-01-09 DIAGNOSIS — C44329 Squamous cell carcinoma of skin of other parts of face: Secondary | ICD-10-CM | POA: Diagnosis not present

## 2022-01-09 DIAGNOSIS — D485 Neoplasm of uncertain behavior of skin: Secondary | ICD-10-CM | POA: Diagnosis not present

## 2022-01-16 NOTE — Patient Instructions (Incomplete)
Jerry Velazquez , Thank you for taking time to come for your Medicare Wellness Visit. I appreciate your ongoing commitment to your health goals. Please review the following plan we discussed and let me know if I can assist you in the future.   These are the goals we discussed:  Goals      DIET - REDUCE FAT INTAKE     Track and Manage Heart Rate and Rhythm-Atrial Fibrillation     Timeframe:  Long-Range Goal Priority:  High Start Date:   06/15/20                          Expected End Date:  12/15/20                     Follow Up Date 10/13/20   - make a plan to exercise regularly - make a plan to eat healthy - take medicine as prescribed    Why is this important?   Atrial fibrillation may have no symptoms. Sometimes the symptoms get worse or happen more often.  It is important to keep track of what your symptoms are and when they happen.  A change in symptoms is important to discuss with your doctor or nurse.  Being active and healthy eating will also help you manage your heart condition.     Notes:         This is a list of the screening recommended for you and due dates:  Health Maintenance  Topic Date Due   DTaP/Tdap/Td vaccine (1 - Tdap) Never done   Pneumonia Vaccine (2 - PCV) 09/15/2019   Flu Shot  08/14/2021   Medicare Annual Wellness Visit  01/11/2022   COVID-19 Vaccine  Completed   Hepatitis C Screening: USPSTF Recommendation to screen - Ages 18-79 yo.  Completed   Zoster (Shingles) Vaccine  Completed   HPV Vaccine  Aged Out   Cologuard (Stool DNA test)  Discontinued    Advanced directives: ***  Conditions/risks identified: Aim for 30 minutes of exercise or brisk walking, 6-8 glasses of water, and 5 servings of fruits and vegetables each day.   Next appointment: Follow up in one year for your annual wellness visit.   Preventive Care 78 Years and Older, Male  Preventive care refers to lifestyle choices and visits with your health care provider that can promote  health and wellness. What does preventive care include? A yearly physical exam. This is also called an annual well check. Dental exams once or twice a year. Routine eye exams. Ask your health care provider how often you should have your eyes checked. Personal lifestyle choices, including: Daily care of your teeth and gums. Regular physical activity. Eating a healthy diet. Avoiding tobacco and drug use. Limiting alcohol use. Practicing safe sex. Taking low doses of aspirin every day. Taking vitamin and mineral supplements as recommended by your health care provider. What happens during an annual well check? The services and screenings done by your health care provider during your annual well check will depend on your age, overall health, lifestyle risk factors, and family history of disease. Counseling  Your health care provider may ask you questions about your: Alcohol use. Tobacco use. Drug use. Emotional well-being. Home and relationship well-being. Sexual activity. Eating habits. History of falls. Memory and ability to understand (cognition). Work and work Statistician. Screening  You may have the following tests or measurements: Height, weight, and BMI. Blood pressure. Lipid and  cholesterol levels. These may be checked every 5 years, or more frequently if you are over 61 years old. Skin check. Lung cancer screening. You may have this screening every year starting at age 62 if you have a 30-pack-year history of smoking and currently smoke or have quit within the past 15 years. Fecal occult blood test (FOBT) of the stool. You may have this test every year starting at age 57. Flexible sigmoidoscopy or colonoscopy. You may have a sigmoidoscopy every 5 years or a colonoscopy every 10 years starting at age 29. Prostate cancer screening. Recommendations will vary depending on your family history and other risks. Hepatitis C blood test. Hepatitis B blood test. Sexually transmitted  disease (STD) testing. Diabetes screening. This is done by checking your blood sugar (glucose) after you have not eaten for a while (fasting). You may have this done every 1-3 years. Abdominal aortic aneurysm (AAA) screening. You may need this if you are a current or former smoker. Osteoporosis. You may be screened starting at age 43 if you are at high risk. Talk with your health care provider about your test results, treatment options, and if necessary, the need for more tests. Vaccines  Your health care provider may recommend certain vaccines, such as: Influenza vaccine. This is recommended every year. Tetanus, diphtheria, and acellular pertussis (Tdap, Td) vaccine. You may need a Td booster every 10 years. Zoster vaccine. You may need this after age 47. Pneumococcal 13-valent conjugate (PCV13) vaccine. One dose is recommended after age 65. Pneumococcal polysaccharide (PPSV23) vaccine. One dose is recommended after age 56. Talk to your health care provider about which screenings and vaccines you need and how often you need them. This information is not intended to replace advice given to you by your health care provider. Make sure you discuss any questions you have with your health care provider. Document Released: 01/27/2015 Document Revised: 09/20/2015 Document Reviewed: 11/01/2014 Elsevier Interactive Patient Education  2017 [employer] Prevention in the Home Falls can cause injuries. They can happen to people of all ages. There are many things you can do to make your home safe and to help prevent falls. What can I do on the outside of my home? Regularly fix the edges of walkways and driveways and fix any cracks. Remove anything that might make you trip as you walk through a door, such as a raised step or threshold. Trim any bushes or trees on the path to your home. Use bright outdoor lighting. Clear any walking paths of anything that might make someone trip, such as rocks or  tools. Regularly check to see if handrails are loose or broken. Make sure that both sides of any steps have handrails. Any raised decks and porches should have guardrails on the edges. Have any leaves, snow, or ice cleared regularly. Use sand or salt on walking paths during winter. Clean up any spills in your garage right away. This includes oil or grease spills. What can I do in the bathroom? Use night lights. Install grab bars by the toilet and in the tub and shower. Do not use towel bars as grab bars. Use non-skid mats or decals in the tub or shower. If you need to sit down in the shower, use a plastic, non-slip stool. Keep the floor dry. Clean up any water that spills on the floor as soon as it happens. Remove soap buildup in the tub or shower regularly. Attach bath mats securely with double-sided non-slip rug tape. Do not  have throw rugs and other things on the floor that can make you trip. What can I do in the bedroom? Use night lights. Make sure that you have a light by your bed that is easy to reach. Do not use any sheets or blankets that are too big for your bed. They should not hang down onto the floor. Have a firm chair that has side arms. You can use this for support while you get dressed. Do not have throw rugs and other things on the floor that can make you trip. What can I do in the kitchen? Clean up any spills right away. Avoid walking on wet floors. Keep items that you use a lot in easy-to-reach places. If you need to reach something above you, use a strong step stool that has a grab bar. Keep electrical cords out of the way. Do not use floor polish or wax that makes floors slippery. If you must use wax, use non-skid floor wax. Do not have throw rugs and other things on the floor that can make you trip. What can I do with my stairs? Do not leave any items on the stairs. Make sure that there are handrails on both sides of the stairs and use them. Fix handrails that are  broken or loose. Make sure that handrails are as long as the stairways. Check any carpeting to make sure that it is firmly attached to the stairs. Fix any carpet that is loose or worn. Avoid having throw rugs at the top or bottom of the stairs. If you do have throw rugs, attach them to the floor with carpet tape. Make sure that you have a light switch at the top of the stairs and the bottom of the stairs. If you do not have them, ask someone to add them for you. What else can I do to help prevent falls? Wear shoes that: Do not have high heels. Have rubber bottoms. Are comfortable and fit you well. Are closed at the toe. Do not wear sandals. If you use a stepladder: Make sure that it is fully opened. Do not climb a closed stepladder. Make sure that both sides of the stepladder are locked into place. Ask someone to hold it for you, if possible. Clearly mark and make sure that you can see: Any grab bars or handrails. First and last steps. Where the edge of each step is. Use tools that help you move around (mobility aids) if they are needed. These include: Canes. Walkers. Scooters. Crutches. Turn on the lights when you go into a dark area. Replace any light bulbs as soon as they burn out. Set up your furniture so you have a clear path. Avoid moving your furniture around. If any of your floors are uneven, fix them. If there are any pets around you, be aware of where they are. Review your medicines with your doctor. Some medicines can make you feel dizzy. This can increase your chance of falling. Ask your doctor what other things that you can do to help prevent falls. This information is not intended to replace advice given to you by your health care provider. Make sure you discuss any questions you have with your health care provider. Document Released: 10/27/2008 Document Revised: 06/08/2015 Document Reviewed: 02/04/2014 Elsevier Interactive Patient Education  2017 [employer].

## 2022-01-16 NOTE — Progress Notes (Unsigned)
Subjective:   Jerry Velazquez is a 77 y.o. male who presents for Medicare Annual/Subsequent preventive examination.  Review of Systems    ***       Objective:    There were no vitals filed for this visit. There is no height or weight on file to calculate BMI.     07/05/2021   12:12 PM 01/11/2021   11:24 AM 11/25/2019    9:34 AM 09/17/2017    8:35 AM  Advanced Directives  Does Patient Have a Medical Advance Directive? Yes Yes Yes No  Type of Corporate treasurer of [place];Living will [place];Out of facility DNR (pink MOST or yellow form);Living will   Copy of [place] in Chart?  Yes - validated most recent copy scanned in chart (See row information)    Would patient like information on creating a medical advance directive?    Yes (MAU/Ambulatory/Procedural Areas - Information given)    Current Medications (verified) Outpatient Encounter Medications as of 01/17/2022  Medication Sig   atorvastatin (LIPITOR) 20 MG tablet TAKE 1 TABLET BY MOUTH EVERY DAY   cyclobenzaprine (FLEXERIL) 10 MG tablet Take 1 tablet (10 mg total) by mouth 3 (three) times daily as needed for muscle spasms.   ELIQUIS 5 MG TABS tablet TAKE 1 TABLET BY MOUTH TWICE A DAY   gabapentin (NEURONTIN) 300 MG capsule Take 1 capsule (300 mg total) by mouth 3 (three) times daily.   GLUCOSAMINE HCL PO Take 2,000 mg by mouth daily.   lisinopril (ZESTRIL) 5 MG tablet TAKE 1 TABLET BY MOUTH EVERY DAY   metoprolol tartrate (LOPRESSOR) 25 MG tablet Take 0.5 tablets (12.5 mg total) by mouth every 6 (six) hours as needed (palpitations).   Omega-3 Fatty Acids (FISH OIL) 1000 MG CAPS Take 1,000 mg by mouth daily.   PFIZER COVID-19 VAC BIVALENT injection    No facility-administered encounter medications on file as of 01/17/2022.    Allergies (verified) Patient has no known allergies.   History: Past Medical History:  Diagnosis Date   Aortic aneurysm ([place])    Aortic  insufficiency due to bicuspid aortic valve    Aortic stenosis due to bicuspid aortic valve    Atrial flutter (HCC)    Bicuspid aortic valve    GERD (gastroesophageal reflux disease)    past hx- stopped alcohol and no more issues    NSVT (nonsustained ventricular tachycardia) (HCC)    Paroxysmal atrial fibrillation (HCC)    PVC's (premature ventricular contractions)    RBBB    Past Surgical History:  Procedure Laterality Date   COLONOSCOPY     12 yrs ago [facility]- normal per pt    CYST EXCISION     left knee    CYST EXCISION     tongue   Family History  Problem Relation Age of Onset   Heart disease Mother    Hypertension Father    Stroke Father    COPD Sister    Colon polyps Sister    Hypertension Brother    COPD Brother    Colon cancer Neg Hx    Esophageal cancer Neg Hx    Rectal cancer Neg Hx    Stomach cancer Neg Hx    Social History   Socioeconomic History   Marital status: Married    Spouse name: Jerry Velazquez   Number of children: 0   Years of education: Not on file   Highest education level: Not on file  Occupational  History   Occupation: retired    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   Social Determinants of Health   Financial Resource Strain: Low Risk  (01/11/2021)   Overall Financial Resource Strain (CARDIA)    Difficulty of Paying Living Expenses: Not hard at all  Food Insecurity: No Food Insecurity (01/11/2021)   Hunger Vital Sign    Worried About Running Out of Food in the Last Year: Never true     Ran Out of Food in the Last Year: Never true  Transportation Needs: No Transportation Needs (01/11/2021)   PRAPARE - Hydrologist (Medical): No    Lack of Transportation (Non-Medical): No  Physical Activity: Sufficiently Active (01/11/2021)   Exercise Vital Sign    Days of Exercise per Week: 5 days    Minutes of Exercise per Session: 30 min  Stress: No Stress Concern Present (01/11/2021)   [place]    Feeling of Stress : Not at all  Social Connections: [place] (01/11/2021)   Social Connection and Isolation Panel [NHANES]    Frequency of Communication with Friends and Family: More than three times a week    Frequency of Social Gatherings with Friends and Family: More than three times a week    Attends Religious Services: More than 4 times per year    Active Member of [employer] or Organizations: Yes    Attends Music therapist: More than 4 times per year    Marital Status: Married    Tobacco Counseling Counseling given: Not Answered Tobacco comments: smoked a pipe at age 20 to 29   Clinical Intake:                 Diabetic?***         Activities of Daily Living     No data to display          Patient Care Team: Susy Frizzle, MD as PCP - General (Family Medicine) Lorretta Harp, MD as PCP - Cardiology (Cardiology) [name], PA-C as Physician Assistant (Family Medicine) Gala Romney Cristopher Estimable, MD as Consulting Physician (Gastroenterology) [name], [facility] as Pharmacist (Pharmacist)  Indicate any recent Medical Services you may have received from other than [facility] providers in the past year (date may be approximate).     Assessment:   This is a routine wellness examination for Jerry Velazquez.  Hearing/Vision screen No results found.  Dietary issues and exercise activities discussed:     Goals Addressed   None    Depression  Screen    01/11/2021   11:20 AM 05/25/2020    8:04 AM 11/25/2019    9:34 AM 09/17/2017    7:59 AM 09/03/2017    8:22 AM  PHQ 2/9 Scores  PHQ - 2 Score 0 0 0 0 0    Fall  Risk    01/11/2021   11:24 AM 05/25/2020    8:04 AM 11/25/2019    9:34 AM 09/17/2017    7:59 AM 09/03/2017    8:22 AM  Fall Risk   Falls in the past year? 0 0 0 No No  Number falls in past yr: 0  0    Injury with Fall? 0  0    Risk for fall due to : No Fall Risks No Fall Risks     Follow up Falls prevention discussed Falls evaluation completed       FALL RISK PREVENTION PERTAINING TO THE HOME:  Any stairs in or around the home? {YES/NO:21197} If so, are there any without handrails? {YES/NO:21197} Home free of loose throw rugs in walkways, pet beds, electrical cords, etc? {YES/NO:21197} Adequate lighting in your home to reduce risk of falls? {YES/NO:21197}  ASSISTIVE DEVICES UTILIZED TO PREVENT FALLS:  Life alert? {YES/NO:21197} Use of a cane, walker or w/c? {YES/NO:21197} Grab bars in the bathroom? {YES/NO:21197} Shower chair or bench in shower? {YES/NO:21197} Elevated toilet seat or a handicapped toilet? {YES/NO:21197}  TIMED UP AND GO:  Was the test performed? {YES/NO:21197}.  Length of time to ambulate 10 feet: *** sec.   {Appearance of SPQZ:3007622}  Cognitive Function:        11/25/2019    9:35 AM  6CIT Screen  What Year? 0 points  What month? 0 points  What time? 0 points  Count back from 20 0 points  Months in reverse 0 points  Repeat phrase 0 points  Total Score 0 points    Immunizations Immunization History  Administered Date(s) Administered   Fluad Quad(high Dose 65+) 09/15/2018   Influenza-Unspecified 10/23/2020   Moderna Sars-Covid-2 Vaccination 03/27/2019, 11/08/2019, 06/05/2020   Pfizer Covid-19 Vaccine Bivalent Booster 82yr & up 10/23/2020   Pneumococcal Polysaccharide-23 09/15/2018   Zoster Recombinat (Shingrix) 07/24/2020    {TDAP status:2101805}  {Flu Vaccine  status:2101806}  {Pneumococcal vaccine status:2101807}  {Covid-19 vaccine status:2101808}  Qualifies for Shingles Vaccine? {YES/NO:21197}  Zostavax completed {YES/NO:21197}  {Shingrix Completed?:2101804}  Screening Tests Health Maintenance  Topic Date Due   DTaP/Tdap/Td (1 - Tdap) Never done   Pneumonia Vaccine 77 Years old (2 - PCV) 09/15/2019   INFLUENZA VACCINE  08/14/2021   Medicare Annual Wellness (AWV)  01/11/2022   COVID-19 Vaccine  Completed   Hepatitis C Screening  Completed   Zoster Vaccines- Shingrix  Completed   HPV VACCINES  Aged Out   Fecal DNA (Cologuard)  Discontinued    Health Maintenance  Health Maintenance Due  Topic Date Due   DTaP/Tdap/Td (1 - Tdap) Never done   Pneumonia Vaccine 77 Years old (2 - PCV) 09/15/2019   INFLUENZA VACCINE  08/14/2021   Medicare Annual Wellness (AWV)  01/11/2022    {Colorectal cancer screening:2101809}  Lung Cancer Screening: (Low Dose CT Chest recommended if Age 78-80years, 30 pack-year currently smoking OR have quit w/in 15years.) {DOES NOT does:27190::"does not"} qualify.   Lung Cancer Screening Referral: ***  Additional Screening:  Hepatitis C Screening: {DOES NOT does:27190::"does not"} qualify; Completed ***  Vision Screening: Recommended annual ophthalmology exams for early detection of glaucoma and other disorders of the eye. Is the patient up to date with their annual eye exam?  {YES/NO:21197} Who is the provider or what is the name of the office in which the patient attends annual eye exams? *** If pt is not established with a provider, would they like to be referred to a provider to establish  care? {YES/NO:21197}.   Dental Screening: Recommended annual dental exams for proper oral hygiene  Community Resource Referral / Chronic Care Management: CRR required this visit?  {YES/NO:21197}  CCM required this visit?  {YES/NO:21197}     Plan:     I have personally reviewed and noted the following in the  patient's chart:   Medical and social history Use of alcohol, tobacco or illicit drugs  Current medications and supplements including opioid prescriptions. {Opioid Prescriptions:[phone]} Functional ability and status Nutritional status Physical activity Advanced directives List of other physicians Hospitalizations, surgeries, and ER visits in previous 12 months Vitals Screenings to include cognitive, depression, and falls Referrals and appointments  In addition, I have reviewed and discussed with patient certain preventive protocols, quality metrics, and best practice recommendations. A written personalized care plan for preventive services as well as general preventive health recommendations were provided to patient.     Denman George Pablo P[place], [place]   05/20/3891   Nurse Notes: ***

## 2022-01-17 ENCOUNTER — Ambulatory Visit (INDEPENDENT_AMBULATORY_CARE_PROVIDER_SITE_OTHER): Payer: PPO

## 2022-01-17 VITALS — Ht 73.0 in | Wt 200.0 lb

## 2022-01-17 DIAGNOSIS — Z0000 Encounter for general adult medical examination without abnormal findings: Secondary | ICD-10-CM

## 2022-01-24 DIAGNOSIS — C44329 Squamous cell carcinoma of skin of other parts of face: Secondary | ICD-10-CM | POA: Diagnosis not present

## 2022-01-25 ENCOUNTER — Ambulatory Visit (INDEPENDENT_AMBULATORY_CARE_PROVIDER_SITE_OTHER): Payer: PPO | Admitting: Family Medicine

## 2022-01-25 ENCOUNTER — Encounter: Payer: Self-pay | Admitting: Family Medicine

## 2022-01-25 VITALS — BP 128/78 | HR 72 | Ht 73.0 in | Wt 202.0 lb

## 2022-01-25 DIAGNOSIS — N62 Hypertrophy of breast: Secondary | ICD-10-CM | POA: Diagnosis not present

## 2022-01-25 DIAGNOSIS — N644 Mastodynia: Secondary | ICD-10-CM | POA: Diagnosis not present

## 2022-01-25 MED ORDER — GABAPENTIN 100 MG PO CAPS: 100.0000 mg | ORAL_CAPSULE | Freq: Three times a day (TID) | ORAL | 0 refills | Status: DC

## 2022-01-25 NOTE — Progress Notes (Signed)
Subjective:    Patient ID: Jerry Velazquez, male    DOB: May 25, 1945, 77 y.o.   MRN: [id number]  HPI  Patient presents today with 2 concerns.  First he has been on gabapentin 300 mg 3 times a day for almost a year due to neuropathic pain in his leg from lumbar degenerative disc disease.  Thankfully the pain in his leg has improved and he feels that he no longer needs the gabapentin.  He also thinks the gabapentin may be affecting his level of alertness and may be even contributing to some mild mood symptoms.  He is interested in weaning off of that.  Second he also reports a 30-monthhistory of a mass in his right breast.  On examination today, the right breast is enlarged compared to the left breast.  He has some mild gynecomastia.  The mass itself is a nodular lesion at 6:00 from the nipple.  I believe that this is likely a benign enlargement of the lobule due to the gynecomastia.  However the patient states that he is tender in that area. Past Medical History:  Diagnosis Date   [place]    Aortic insufficiency due to bicuspid aortic valve    Aortic stenosis due to bicuspid aortic valve    Atrial flutter (HCC)    Bicuspid aortic valve    GERD (gastroesophageal reflux disease)    past hx- stopped alcohol and no more issues    NSVT (nonsustained ventricular tachycardia) (HCC)    Paroxysmal atrial fibrillation (HCC)    PVC's (premature ventricular contractions)    RBBB    Past Surgical History:  Procedure Laterality Date   COLONOSCOPY     12 yrs ago [facility]- normal per pt    CYST EXCISION     left knee    CYST EXCISION     tongue   Current Outpatient Medications on File Prior to Visit  Medication Sig Dispense Refill   atorvastatin (LIPITOR) 20 MG tablet TAKE 1 TABLET BY MOUTH EVERY DAY 90 tablet 3   cyclobenzaprine (FLEXERIL) 10 MG tablet Take 1 tablet (10 mg total) by mouth 3 (three) times daily as needed for muscle spasms. 30 tablet 0   ELIQUIS 5 MG TABS tablet TAKE 1  TABLET BY MOUTH TWICE A DAY 180 tablet 0   gabapentin (NEURONTIN) 300 MG capsule Take 1 capsule (300 mg total) by mouth 3 (three) times daily. 90 capsule 5   GLUCOSAMINE HCL PO Take 2,000 mg by mouth daily.     lisinopril (ZESTRIL) 5 MG tablet TAKE 1 TABLET BY MOUTH EVERY DAY 90 tablet 3   metoprolol tartrate (LOPRESSOR) 25 MG tablet Take 0.5 tablets (12.5 mg total) by mouth every 6 (six) hours as needed (palpitations). 30 tablet 0   Omega-3 Fatty Acids (FISH OIL) 1000 MG CAPS Take 1,000 mg by mouth daily.     PFIZER COVID-19 VAC BIVALENT injection      No current facility-administered medications on file prior to visit.   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   Social Determinants of Health   Financial Resource Strain: Low Risk  (01/17/2022)   Overall Financial Resource Strain (CARDIA)    Difficulty of Paying Living Expenses: Not hard at all  Food Insecurity: No Food Insecurity (01/17/2022)   Hunger Vital Sign    Worried About Running Out of Food in the Last Year: Never true    Ran Out of Food in the Last Year: Never true  Transportation Needs: No Transportation Needs (01/17/2022)   PRAPARE - Hydrologist (Medical): No    Lack of Transportation  (Non-Medical): No  Physical Activity: Sufficiently Active (01/17/2022)   Exercise Vital Sign    Days of Exercise per Week: 5 days    Minutes of Exercise per Session: 30 min  Stress: No Stress Concern Present (01/17/2022)   [place]    Feeling of Stress : Not at all  Social Connections: [place] (01/17/2022)   Social Connection and Isolation Panel [NHANES]    Frequency of Communication with Friends and Family: More than three times a week    Frequency of Social Gatherings with Friends and Family: Three times a week    Attends Religious Services: More than 4 times per year    Active Member of Clubs or Organizations: Yes    Attends Archivist Meetings: More than 4 times per year    Marital Status: Married  Human resources officer Violence: Not At Risk (01/17/2022)   Humiliation, Afraid, Rape, and Kick questionnaire    Fear of Current or Ex-Partner: No    Emotionally Abused: No    Physically Abused: No    Sexually Abused: No   Family History  Problem Relation Age of Onset   Heart disease Mother    Hypertension Father    Stroke Father    COPD Sister    Colon polyps Sister    Hypertension Brother    COPD Brother    Colon cancer Neg Hx    Esophageal cancer Neg Hx    Rectal cancer Neg Hx    Stomach cancer Neg Hx       Review of Systems  All other systems reviewed and are negative.      Objective:   Physical Exam Vitals reviewed.  Constitutional:      General: He is not in acute distress.    Appearance: Normal appearance. He is normal weight. He is not ill-appearing, toxic-appearing or diaphoretic.  HENT:     Head: Normocephalic and atraumatic.  Cardiovascular:     Rate and Rhythm: Normal rate and regular rhythm.     Pulses: Normal pulses.     Heart sounds: Murmur heard.     No friction rub. No gallop.  Pulmonary:     Effort: Pulmonary effort is normal. No respiratory distress.     Breath  sounds: Normal breath sounds. No stridor. No wheezing, rhonchi or rales.  Chest:  Breasts:    Breasts are asymmetrical.     Right:  Swelling and tenderness present.    Skin:    General: Skin is warm.     Coloration: Skin is not jaundiced or pale.     Findings: No bruising, erythema, lesion or rash.  Neurological:     Mental Status: He is alert.           Assessment & Plan:  Gynecomastia, male - Plan: MM Digital Diagnostic Unilat R, Testosterone Total,Free,Bio, Males  Mastalgia - Plan: MM Digital Diagnostic Unilat R Wean off the gabapentin by decreasing to 100 mg 3 times a day for 3 to 4 days then 100 mg twice a day 4 days then 100 mg once a day for 3 to 4 days and then stop.  I believe that the enlargement of the right breast is due to gynecomastia most likely due to hypogonadism.  I plan to check a testosterone level.  I believe that the nodular lesion in the nipple is most likely a benign enlarged lobule.  However I will obtain an mammogram to evaluate further however I reassured the patient that I feel that this is most likely benign

## 2022-01-26 LAB — TESTOSTERONE TOTAL,FREE,BIO, MALES
Albumin: 4.1 g/dL (ref 3.6–5.1)
Sex Hormone Binding: 84 nmol/L — ABNORMAL HIGH (ref 22–77)
Testosterone, Bioavailable: 44.6 ng/dL (ref 15.0–150.0)
Testosterone, Free: 23.7 pg/mL (ref 6.0–73.0)
Testosterone: 412 ng/dL (ref 250–827)

## 2022-02-04 ENCOUNTER — Other Ambulatory Visit: Payer: Self-pay | Admitting: Family Medicine

## 2022-02-04 DIAGNOSIS — N644 Mastodynia: Secondary | ICD-10-CM

## 2022-02-04 DIAGNOSIS — N62 Hypertrophy of breast: Secondary | ICD-10-CM

## 2022-02-09 ENCOUNTER — Other Ambulatory Visit: Payer: PPO

## 2022-02-11 ENCOUNTER — Ambulatory Visit: Payer: PPO

## 2022-02-11 ENCOUNTER — Ambulatory Visit
Admission: RE | Admit: 2022-02-11 | Discharge: 2022-02-11 | Disposition: A | Discharge status: Home / Self Care | Payer: PPO | Source: Ambulatory Visit | Attending: Family Medicine | Admitting: Family Medicine

## 2022-02-11 DIAGNOSIS — N62 Hypertrophy of breast: Secondary | ICD-10-CM

## 2022-02-11 DIAGNOSIS — N644 Mastodynia: Secondary | ICD-10-CM

## 2022-02-13 ENCOUNTER — Other Ambulatory Visit: Payer: Self-pay | Admitting: Cardiovascular Disease

## 2022-02-13 ENCOUNTER — Other Ambulatory Visit: Payer: Self-pay

## 2022-02-13 DIAGNOSIS — I480 Paroxysmal atrial fibrillation: Secondary | ICD-10-CM

## 2022-02-13 NOTE — Telephone Encounter (Signed)
Prescription refill request for Eliquis received. Indication: Afib  Last office visit: 09/26/21 Gwenlyn Found)  Scr: 0.69 (05/25/20)  Age: 77 Weight: 91.6kg  Labs overdue. Called pt and made him aware. Pt stated he can come to [facility] lab this Friday, 02/15/22. Lab orders places. Refill sent to prevent missed doses.

## 2022-02-15 ENCOUNTER — Other Ambulatory Visit: Payer: Self-pay

## 2022-02-15 DIAGNOSIS — I7121 Aneurysm of the ascending aorta, without rupture: Secondary | ICD-10-CM

## 2022-02-15 DIAGNOSIS — Q231 Congenital insufficiency of aortic valve: Secondary | ICD-10-CM

## 2022-02-15 DIAGNOSIS — I480 Paroxysmal atrial fibrillation: Secondary | ICD-10-CM

## 2022-02-15 DIAGNOSIS — I10 Essential (primary) hypertension: Secondary | ICD-10-CM

## 2022-02-15 DIAGNOSIS — E785 Hyperlipidemia, unspecified: Secondary | ICD-10-CM

## 2022-02-15 LAB — BASIC METABOLIC PANEL
BUN/Creatinine Ratio: 18 (ref 10–24)
BUN: 14 mg/dL (ref 8–27)
CO2: 25 mmol/L (ref 20–29)
Calcium: 9.6 mg/dL (ref 8.6–10.2)
Chloride: 105 mmol/L (ref 96–106)
Creatinine, Ser: 0.78 mg/dL (ref 0.76–1.27)
Glucose: 86 mg/dL (ref 70–99)
Potassium: 4.9 mmol/L (ref 3.5–5.2)
Sodium: 140 mmol/L (ref 134–144)
eGFR: 92 mL/min/{1.73_m2} (ref >59)

## 2022-02-16 LAB — CBC
Hematocrit: 43.7 % (ref 37.5–51.0)
Hemoglobin: 14.5 g/dL (ref 13.0–17.7)
MCH: 30.3 pg (ref 26.6–33.0)
MCHC: 33.2 g/dL (ref 31.5–35.7)
MCV: 91 fL (ref 79–97)
Platelets: 183 10*3/uL (ref 150–450)
RBC: 4.79 x10E6/uL (ref 4.14–5.80)
RDW: 13.7 % (ref 11.6–15.4)
WBC: 5 10*3/uL (ref 3.4–10.8)

## 2022-02-21 ENCOUNTER — Encounter (HOSPITAL_COMMUNITY): Payer: Self-pay | Admitting: *Deleted

## 2022-04-03 ENCOUNTER — Other Ambulatory Visit: Payer: Self-pay | Admitting: Family Medicine

## 2022-04-12 DIAGNOSIS — L2089 Other atopic dermatitis: Secondary | ICD-10-CM | POA: Diagnosis not present

## 2022-04-12 DIAGNOSIS — Z08 Encounter for follow-up examination after completed treatment for malignant neoplasm: Secondary | ICD-10-CM | POA: Diagnosis not present

## 2022-04-12 DIAGNOSIS — L821 Other seborrheic keratosis: Secondary | ICD-10-CM | POA: Diagnosis not present

## 2022-04-12 DIAGNOSIS — Z85828 Personal history of other malignant neoplasm of skin: Secondary | ICD-10-CM | POA: Diagnosis not present

## 2022-04-12 DIAGNOSIS — L570 Actinic keratosis: Secondary | ICD-10-CM | POA: Diagnosis not present

## 2022-04-12 DIAGNOSIS — L814 Other melanin hyperpigmentation: Secondary | ICD-10-CM | POA: Diagnosis not present

## 2022-04-12 DIAGNOSIS — S70362A Insect bite (nonvenomous), left thigh, initial encounter: Secondary | ICD-10-CM | POA: Diagnosis not present

## 2022-04-12 DIAGNOSIS — Z85820 Personal history of malignant melanoma of skin: Secondary | ICD-10-CM | POA: Diagnosis not present

## 2022-04-12 DIAGNOSIS — D225 Melanocytic nevi of trunk: Secondary | ICD-10-CM | POA: Diagnosis not present

## 2022-05-13 ENCOUNTER — Other Ambulatory Visit: Payer: Self-pay | Admitting: Cardiovascular Disease

## 2022-05-13 DIAGNOSIS — I480 Paroxysmal atrial fibrillation: Secondary | ICD-10-CM

## 2022-05-13 NOTE — Telephone Encounter (Signed)
Pt last saw Dr Allyson Sabal 09/26/21, last labs 02/15/22 Creat 0.78, age 77, weight 91.6kg, based on specified criteria pt is on appropriate dosage of Eliquis 5mg  BID for afib.  Will refill rx.

## 2022-06-05 ENCOUNTER — Other Ambulatory Visit: Payer: Self-pay

## 2022-06-05 DIAGNOSIS — I7121 Aneurysm of the ascending aorta, without rupture: Secondary | ICD-10-CM

## 2022-06-05 DIAGNOSIS — I10 Essential (primary) hypertension: Secondary | ICD-10-CM

## 2022-06-07 ENCOUNTER — Telehealth: Payer: Self-pay | Admitting: Pharmacist

## 2022-06-07 NOTE — Progress Notes (Signed)
Care Management & Coordination Services Pharmacy Team  Reason for Encounter: General adherence update   Contacted patient for general health update and medication adherence call.  Spoke with patient on 06/14/2022    What concerns do you have about your medications? none  The patient denies side effects with their medications.   How often do you forget or accidentally miss a dose? Rarely  Do you use a pillbox? Yes  Are you having any problems getting your medications from your pharmacy? No  Has the cost of your medications been a concern? No If yes, what medication and is patient assistance available or has it been applied for?  Since last visit with PharmD, no interventions have been made.   The patient has not had an ED visit since last contact.   The patient denies problems with their health.   Patient denies concerns or questions for Erskine Emery, PharmD at this time.   Counseled patient on: Access to carecoordination team for any cost, medication or pharmacy concerns.  -Patient states he did not like the "side effects" of Gabapentin so he has stopped taking it. He states he has minimal nerve pain and would rather not take any medication for it. He declined to schedule a follow up appointment with pharmacist at this time. He states he will be open to scheduling a visit at a later time.   Chart Updates:  Recent office visits:  01/25/2022 OV (PCP) Donita Brooks, MD; Wean off the gabapentin by decreasing to 100 mg 3 times a day for 3 to 4 days then 100 mg twice a day 4 days then 100 mg once a day for 3 to 4 days and then stop.   Recent consult visits:  None  Hospital visits:  None in previous 6 months  Medications: Outpatient Encounter Medications as of 06/07/2022  Medication Sig Note   apixaban (ELIQUIS) 5 MG TABS tablet TAKE 1 TABLET BY MOUTH TWICE A DAY    atorvastatin (LIPITOR) 20 MG tablet TAKE 1 TABLET BY MOUTH EVERY DAY    cyclobenzaprine (FLEXERIL) 10 MG  tablet Take 1 tablet (10 mg total) by mouth 3 (three) times daily as needed for muscle spasms.    gabapentin (NEURONTIN) 100 MG capsule TAKE 1 CAPSULE (100 MG TOTAL) BY MOUTH THREE TIMES DAILY.    GLUCOSAMINE HCL PO Take 2,000 mg by mouth daily.    lisinopril (ZESTRIL) 5 MG tablet TAKE 1 TABLET BY MOUTH EVERY DAY    metoprolol tartrate (LOPRESSOR) 25 MG tablet Take 0.5 tablets (12.5 mg total) by mouth every 6 (six) hours as needed (palpitations). 01/11/2021: Pt states he only takes as needed due to palpitations.    Omega-3 Fatty Acids (FISH OIL) 1000 MG CAPS Take 1,000 mg by mouth daily.    PFIZER COVID-19 VAC BIVALENT injection     No facility-administered encounter medications on file as of 06/07/2022.    Recent vitals BP Readings from Last 3 Encounters:  01/25/22 128/78  09/26/21 138/74  09/12/21 (!) 160/60   Pulse Readings from Last 3 Encounters:  01/25/22 72  09/26/21 (!) 58  09/12/21 64   Wt Readings from Last 3 Encounters:  01/25/22 202 lb (91.6 kg)  01/17/22 200 lb (90.7 kg)  09/26/21 199 lb (90.3 kg)   BMI Readings from Last 3 Encounters:  01/25/22 26.65 kg/m  01/17/22 26.39 kg/m  09/26/21 26.25 kg/m    Recent lab results    Component Value Date/Time   NA 140 02/15/2022 1014  K 4.9 02/15/2022 1014   CL 105 02/15/2022 1014   CO2 25 02/15/2022 1014   GLUCOSE 86 02/15/2022 1014   GLUCOSE 81 05/25/2020 0814   BUN 14 02/15/2022 1014   CREATININE 0.78 02/15/2022 1014   CREATININE 0.69 (L) 05/25/2020 0814   CALCIUM 9.6 02/15/2022 1014    Lab Results  Component Value Date   CREATININE 0.78 02/15/2022   EGFR 92 02/15/2022   GFRNONAA 93 05/25/2020   GFRAA 108 05/25/2020   No results found for: "HGBA1C", "FRUCTOSAMINE", "MICROALBUR"  Lab Results  Component Value Date   CHOL 131 05/25/2020   HDL 58 05/25/2020   LDLCALC 62 05/25/2020   TRIG 37 05/25/2020   CHOLHDL 2.3 05/25/2020    Care Gaps: Annual wellness visit in last year? Yes  Star Rating Drugs:   Lisinopril 5 mg last filled 04/19/2022 90 DS Atorvastatin 20 mg last filled 05/24/2022 55 DS   Future Appointments  Date Time Provider Department Center  08/27/2022 11:15 AM [facility] 1 [id number]  10/01/2022 11:15 AM Runell Gess, MD [facility] None  10/04/2022 11:00 AM MC-CT 1 [facility]  01/23/2023 11:45 AM [facility]-ANNUAL WELLNESS VISIT [facility]   April D Calhoun, [facility] Clinical Pharmacist Assistant [phone]

## 2022-08-27 ENCOUNTER — Ambulatory Visit (HOSPITAL_COMMUNITY): Payer: PPO

## 2022-09-12 ENCOUNTER — Ambulatory Visit (HOSPITAL_COMMUNITY): Payer: PPO | Attending: Cardiology

## 2022-09-12 DIAGNOSIS — Q231 Congenital insufficiency of aortic valve: Secondary | ICD-10-CM | POA: Insufficient documentation

## 2022-09-12 DIAGNOSIS — I7121 Aneurysm of the ascending aorta, without rupture: Secondary | ICD-10-CM | POA: Insufficient documentation

## 2022-09-12 DIAGNOSIS — E785 Hyperlipidemia, unspecified: Secondary | ICD-10-CM | POA: Insufficient documentation

## 2022-09-12 DIAGNOSIS — I480 Paroxysmal atrial fibrillation: Secondary | ICD-10-CM | POA: Diagnosis not present

## 2022-09-12 DIAGNOSIS — I10 Essential (primary) hypertension: Secondary | ICD-10-CM | POA: Insufficient documentation

## 2022-09-12 LAB — ECHOCARDIOGRAM COMPLETE
AR max vel: 1.18 cm2
AV Area VTI: 1.19 cm2
AV Area mean vel: 1.27 cm2
AV Mean grad: 25 mmHg
AV Peak grad: 41 mmHg
Ao pk vel: 3.2 m/s
S' Lateral: 2.8 cm

## 2022-09-20 ENCOUNTER — Other Ambulatory Visit (HOSPITAL_BASED_OUTPATIENT_CLINIC_OR_DEPARTMENT_OTHER): Payer: PPO

## 2022-09-24 ENCOUNTER — Ambulatory Visit: Payer: PPO | Admitting: Cardiovascular Disease

## 2022-09-26 ENCOUNTER — Ambulatory Visit (HOSPITAL_BASED_OUTPATIENT_CLINIC_OR_DEPARTMENT_OTHER): Payer: PPO

## 2022-10-01 ENCOUNTER — Encounter: Payer: Self-pay | Admitting: Cardiovascular Disease

## 2022-10-01 ENCOUNTER — Ambulatory Visit: Payer: PPO | Attending: Cardiovascular Disease | Admitting: Cardiovascular Disease

## 2022-10-01 VITALS — BP 138/80 | HR 65 | Ht 73.0 in | Wt 199.0 lb

## 2022-10-01 DIAGNOSIS — I10 Essential (primary) hypertension: Secondary | ICD-10-CM | POA: Diagnosis not present

## 2022-10-01 DIAGNOSIS — Q231 Congenital insufficiency of aortic valve: Secondary | ICD-10-CM

## 2022-10-01 DIAGNOSIS — I7121 Aneurysm of the ascending aorta, without rupture: Secondary | ICD-10-CM

## 2022-10-01 DIAGNOSIS — I471 Supraventricular tachycardia, unspecified: Secondary | ICD-10-CM | POA: Diagnosis not present

## 2022-10-01 DIAGNOSIS — R002 Palpitations: Secondary | ICD-10-CM | POA: Diagnosis not present

## 2022-10-01 DIAGNOSIS — E785 Hyperlipidemia, unspecified: Secondary | ICD-10-CM | POA: Diagnosis not present

## 2022-10-01 DIAGNOSIS — I480 Paroxysmal atrial fibrillation: Secondary | ICD-10-CM

## 2022-10-01 DIAGNOSIS — I351 Nonrheumatic aortic (valve) insufficiency: Secondary | ICD-10-CM

## 2022-10-01 NOTE — Patient Instructions (Signed)
Medication Instructions:  Your physician recommends that you continue on your current medications as directed. Please refer to the Current Medication list given to you today.  *If you need a refill on your cardiac medications before your next appointment, please call your pharmacy*   Testing/Procedures: Your physician has requested that you have an echocardiogram. Echocardiography is a painless test that uses sound waves to create images of your heart. It provides your doctor with information about the size and shape of your heart and how well your heart's chambers and valves are working. This procedure takes approximately one hour. There are no restrictions for this procedure. Please do NOT wear cologne, perfume, aftershave, or lotions (deodorant is allowed). Please arrive 15 minutes prior to your appointment time.  **To be done in August 2025**    Follow-Up: At [facility], you and your health needs are our priority.  As part of our continuing mission to provide you with exceptional heart care, we have created designated Provider Care Teams.  These Care Teams include your primary Cardiologist (physician) and Advanced Practice Providers (APPs -  Physician Assistants and Nurse Practitioners) who all work together to provide you with the care you need, when you need it.  We recommend signing up for the patient portal called "MyChart".  Sign up information is provided on this After Visit Summary.  MyChart is used to connect with patients for Virtual Visits (Telemedicine).  Patients are able to view lab/test results, encounter notes, upcoming appointments, etc.  Non-urgent messages can be sent to your provider as well.   To learn more about what you can do with MyChart, go to [email or web address].    Your next appointment:   12 month(s)  Provider:   Nanetta Batty, MD

## 2022-10-01 NOTE — Assessment & Plan Note (Signed)
History of paroxysmal atrial fibrillation currently in A-fib with a slow ventricular sponsor 64 on Eliquis oral anticoagulation.  He is unaware of this.

## 2022-10-01 NOTE — Progress Notes (Signed)
10/01/2022 Jerry Velazquez   28-Aug-1945  [id number]  Primary Physician Tanya Nones Priscille Heidelberg, MD Primary Cardiologist: Jerry Gess MD Jerry Velazquez, [place]  HPI:  Jerry Velazquez is a 77 y.o.   thin appearing married Caucasian male father of no children who is retired Production manager.  He was referred by Jerry Hutson Luft, PA-C for cardiovascular evaluation because of aortic insufficiency.    I last  saw him in the office 09/26/2021.  He has no cardiac risk factors.  He was seen because of palpitations with a recent Holter monitor that showed PVCs and short atrial runs.  2D echo performed because of his soft murmur revealed a bicuspid aortic valve with mild stenosis and mild to moderate aortic insufficiency with normal LV size and function.  There was noted mild dilatation of the thoracic aorta.  He also was experiences a quivering sensation in his chest.  He has denied chest pain or shortness of breath in the past.   I referred him to Dr. Johney Frame and Dr. Laneta Simmers as well.  Ultimately he was begun on Eliquis oral anticoagulation.  A chest CTA was performed 10/31/2017 revealing a 4.9 cm ascending thoracic aortic aneurysm.  This will be followed on an semiannual basis.  He will ultimately need thoracic aortic repair as well as AVR.  It is possible that he would also benefit from a surgical maze procedure during the operation and removal of his left atrial appendage but I will leave this to Dr. Laneta Simmers to decide.   Since I  saw him a year ago he continues to do well.  I he is completely asymptomatic.  He is very active.  Recent 2D echo performed 09/12/2022 revealed normal LV systolic function, grade 1 diastolic dysfunction, mild to moderate AI, moderate AS (bicuspid aortic valve) with an aortic valve area of 1.19 cm cm and a peak gradient of 41 mmHg.  This was unchanged from his prior echo.  Recent CTA performed 09/12/2021 revealed his thoracic aorta measured 48 mm, not at threshold for consideration  for replacement.  He is followed by Dr. Sharee Pimple office.   Current Meds  Medication Sig   apixaban (ELIQUIS) 5 MG TABS tablet TAKE 1 TABLET BY MOUTH TWICE A DAY   atorvastatin (LIPITOR) 20 MG tablet TAKE 1 TABLET BY MOUTH EVERY DAY   cyclobenzaprine (FLEXERIL) 10 MG tablet Take 1 tablet (10 mg total) by mouth 3 (three) times daily as needed for muscle spasms.   GLUCOSAMINE HCL PO Take 2,000 mg by mouth daily.   lisinopril (ZESTRIL) 5 MG tablet TAKE 1 TABLET BY MOUTH EVERY DAY   Omega-3 Fatty Acids (FISH OIL) 1000 MG CAPS Take 1,000 mg by mouth daily.   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   Social Determinants of Health   Financial Resource Strain: Low Risk  (01/17/2022)   Overall Financial Resource Strain (CARDIA)    Difficulty of Paying Living Expenses: Not hard at all  Food Insecurity: No Food Insecurity (01/17/2022)   Hunger Vital Sign    Worried About Running Out of Food in the Last Year: Never true    Ran Out of Food in the Last Year: Never true  Transportation Needs: No Transportation Needs (01/17/2022)   PRAPARE - Administrator, Civil Service (Medical): No    Lack of Transportation (Non-Medical): No   Physical Activity: Sufficiently Active (01/17/2022)   Exercise Vital Sign    Days of Exercise per Week: 5 days    Minutes of Exercise per Session: 30 min  Stress: No Stress Concern Present (01/17/2022)   [name] of Occupational Health - Occupational Stress Questionnaire    Feeling of Stress : Not at all  Social Connections: Socially Integrated (01/17/2022)   Social Connection and Isolation Panel [NHANES]    Frequency of Communication with Friends and Family: More than three times a week    Frequency of Social Gatherings with Friends and Family: Three times a week    Attends Religious Services: More than 4 times per year    Active Member of Clubs or Organizations: Yes    Attends Banker Meetings: More than 4 times per year    Marital Status: Married  Catering manager Violence: Not At Risk (01/17/2022)   Humiliation, Afraid, Rape, and Kick questionnaire    Fear of Current or Ex-Partner: No    Emotionally Abused: No    Physically Abused: No    Sexually Abused: No     Review of Systems: General: negative for chills, fever, night sweats or weight changes.  Cardiovascular: negative for chest pain, dyspnea on exertion, edema, orthopnea, palpitations, paroxysmal nocturnal dyspnea or shortness of breath Dermatological: negative for rash Respiratory: negative for cough or wheezing Urologic: negative for hematuria Abdominal: negative for nausea, vomiting, diarrhea, bright red blood per rectum, melena, or hematemesis Neurologic: negative for visual changes, syncope, or dizziness All other systems reviewed and are otherwise negative except as noted above.    Blood pressure 138/80, pulse 65, height 6\' 1"  (1.854 m), weight 199 lb (90.3 kg).  General appearance: alert and no distress Neck: no adenopathy, no carotid bruit, no JVD, supple, symmetrical, trachea midline, and thyroid not enlarged, symmetric, no tenderness/mass/nodules Lungs: clear to auscultation bilaterally Heart:  irregularly irregular rhythm and soft outflow tract murmur consistent with aortic stenosis Extremities: extremities normal, atraumatic, no cyanosis or edema Pulses: 2+ and symmetric Skin: Skin color, texture, turgor normal. No rashes or lesions Neurologic: Grossly normal  EKG EKG Interpretation Date/Time:  Tuesday October 01 2022 11:21:13 EDT Ventricular Rate:  65 PR Interval:    QRS Duration:  96 QT Interval:  396 QTC Calculation: 411 R Axis:   -30  Text Interpretation: Atrial fibrillation Left axis deviation RSR' or QR pattern in V1 suggests right ventricular conduction delay Minimal voltage criteria for LVH, may be normal variant ( R in aVL ) Septal infarct , age undetermined When compared with ECG of 26-Aug-2018 09:45, Atrial fibrillation has replaced Sinus rhythm Confirmed by Allyson Sabal,  Christiane Ha [phone]) on 10/01/2022 11:35:25 AM    ASSESSMENT AND PLAN:   Dyslipidemia History of dyslipidemia on statin therapy with lipid profile performed 05/25/2020 revealed total cholesterol 131, LDL 62 and HDL 58.  Aortic insufficiency History of mild aortic insufficiency by 2D echo recently performed 09/12/2022.  Thoracic aortic aneurysm (HCC) History of thoracic aortic aneurysm currently measuring 50 mm by 2D echo, 48 mm by CTA a year ago.  Is followed by Dr. Laneta Simmers.  Bicuspid aortic valve History of bicuspid aortic valve with moderate aortic stenosis by 2D echo performed 09/12/2022.  His aortic valve area measures 1.19 cm with a peak gradient of 41 mmHg which represents only mild worsening compared to last year.  This is repeated on an annual basis.  Should he require thoracic aortic root replacement he would need to have right and left heart cath and aortic valve replacement simultaneously.  Essential hypertension History of essential hypertension blood pressure measured today at 138/80.  He is on lisinopril and metoprolol.  Paroxysmal atrial fibrillation (HCC) History of paroxysmal atrial  fibrillation currently in A-fib with a slow ventricular sponsor 65 on Eliquis oral anticoagulation.  He is unaware of this.     Jerry Gess MD FACP,FACC,FAHA, [facility] 10/01/2022 11:54 AM

## 2022-10-01 NOTE — Assessment & Plan Note (Signed)
History of dyslipidemia on statin therapy with lipid profile performed 05/25/2020 revealed total cholesterol 131, LDL 62 and HDL 58.

## 2022-10-01 NOTE — Assessment & Plan Note (Signed)
History of essential hypertension blood pressure measured today at 138/80.  He is on lisinopril and metoprolol.

## 2022-10-01 NOTE — Assessment & Plan Note (Signed)
History of mild aortic insufficiency by 2D echo recently performed 09/12/2022.

## 2022-10-01 NOTE — Assessment & Plan Note (Signed)
History of bicuspid aortic valve with moderate aortic stenosis by 2D echo performed 09/12/2022.  His aortic valve area measures 1.19 cm with a peak gradient of 41 mmHg which represents only mild worsening compared to last year.  This is repeated on an annual basis.  Should he require thoracic aortic root replacement he would need to have right and left heart cath and aortic valve replacement simultaneously.

## 2022-10-01 NOTE — Assessment & Plan Note (Signed)
History of thoracic aortic aneurysm currently measuring 50 mm by 2D echo, 48 mm by CTA a year ago.  Is followed by Dr. Laneta Simmers.

## 2022-10-04 ENCOUNTER — Ambulatory Visit (HOSPITAL_COMMUNITY)
Admission: RE | Admit: 2022-10-04 | Discharge: 2022-10-04 | Disposition: A | Discharge status: Home / Self Care | Payer: PPO | Source: Ambulatory Visit | Attending: Cardiovascular Disease | Admitting: Cardiovascular Disease

## 2022-10-04 DIAGNOSIS — I7121 Aneurysm of the ascending aorta, without rupture: Secondary | ICD-10-CM | POA: Diagnosis not present

## 2022-10-04 DIAGNOSIS — I10 Essential (primary) hypertension: Secondary | ICD-10-CM | POA: Diagnosis not present

## 2022-10-04 DIAGNOSIS — I517 Cardiomegaly: Secondary | ICD-10-CM | POA: Diagnosis not present

## 2022-10-04 DIAGNOSIS — I2510 Atherosclerotic heart disease of native coronary artery without angina pectoris: Secondary | ICD-10-CM | POA: Diagnosis not present

## 2022-10-04 DIAGNOSIS — I700 Atherosclerosis of aorta: Secondary | ICD-10-CM | POA: Diagnosis not present

## 2022-10-04 MED ORDER — IOHEXOL 350 MG/ML SOLN
75.0000 mL | Freq: Once | INTRAVENOUS | Status: AC | PRN
  Administered 2022-10-04: 75 mL via INTRAVENOUS

## 2022-10-09 ENCOUNTER — Encounter: Payer: Self-pay | Admitting: Surgery

## 2022-10-09 ENCOUNTER — Ambulatory Visit: Payer: PPO | Admitting: Surgery

## 2022-10-09 VITALS — BP 130/88 | HR 71 | Resp 18 | Ht 73.0 in | Wt 199.0 lb

## 2022-10-09 DIAGNOSIS — I712 Thoracic aortic aneurysm, without rupture, unspecified: Secondary | ICD-10-CM

## 2022-10-09 NOTE — Progress Notes (Unsigned)
HPI:  The patient is a 77 year old gentleman with bicuspid aortic valve and a history of a stable 4.9 cm ascending aortic aneurysm that has been stable since it was diagnosed in 2019.  He has significant aortic valve calcification with a mean gradient of 19 mmHg by echo in October 2020 consistent with mild to moderate aortic stenosis.  There was also mild to moderate aortic insufficiency.  He had a follow-up echocardiogram on 08/23/2019 which showed a mean gradient across the aortic valve of 16 mmHg with mild aortic insufficiency.  The ascending aorta was measured at 49 mm.  Left ventricular ejection fraction was normal at 60 to 65%.  He saw Gershon Crane, PA-C on 09/06/2020 and CTA of the chest at that time showed the ascending aneurysm to be stable at 4.8 cm.  2D echocardiogram at that time showed a severely calcified and thickened aortic valve with a mean gradient of 25 mmHg and a dimensionless index of 0.27.  Aortic valve area by VTI was 1.35 cm consistent with moderate aortic stenosis.  AI pressure half-time was 418 ms consistent with mild aortic insufficiency.  Left ventricular ejection fraction was 55 to 60%.  I saw him on 09/12/2021 and CTA of the chest showed a stable 4.8 cm fusiform ascending aortic aneurysm.  Echocardiogram showed a calcified bicuspid aortic valve with a mean gradient of 26 mmHg and a valve area by VTI of 1.33 cm consistent with moderate aortic stenosis.  There was mild to moderate aortic insufficiency.  He continues to report that he can exert himself taking brisk walks without shortness of breath or having to stop.  He still notes that if he works hard for several hours he is exhausted at the end of the day.  He denies any chest pain or pressure.  He denies dizziness and syncope.  Current Outpatient Medications  Medication Sig Dispense Refill   apixaban (ELIQUIS) 5 MG TABS tablet TAKE 1 TABLET BY MOUTH TWICE A DAY 180 tablet 1   atorvastatin (LIPITOR) 20 MG tablet TAKE 1 TABLET  BY MOUTH EVERY DAY 90 tablet 3   cyclobenzaprine (FLEXERIL) 10 MG tablet Take 1 tablet (10 mg total) by mouth 3 (three) times daily as needed for muscle spasms. 30 tablet 0   GLUCOSAMINE HCL PO Take 2,000 mg by mouth daily.     lisinopril (ZESTRIL) 5 MG tablet TAKE 1 TABLET BY MOUTH EVERY DAY 90 tablet 3   Omega-3 Fatty Acids (FISH OIL) 1000 MG CAPS Take 1,000 mg by mouth daily.     PFIZER COVID-19 VAC BIVALENT injection      metoprolol tartrate (LOPRESSOR) 25 MG tablet Take 0.5 tablets (12.5 mg total) by mouth every 6 (six) hours as needed (palpitations). 30 tablet 0   No current facility-administered medications for this visit.     Physical Exam: BP 130/88 (BP Location: Left Arm, Patient Position: Sitting)   Pulse 71   Resp 18   Ht 6\' 1"  (1.854 m)   Wt 199 lb (90.3 kg)   SpO2 98% Comment: RA  BMI 26.25 kg/m  He looks well. Cardiac exam shows a regular rate and rhythm with a 2/6 systolic murmur along the right sternal border.  There is no diastolic murmur. Lungs are clear. There is no peripheral edema.  Diagnostic Tests:  ECHOCARDIOGRAM REPORT       Patient Name:   Jerry Velazquez Date of Exam: 09/12/2022  Medical Rec #:  [id number]     Height:  73.0 in  Accession #:    [id number]    Weight:       202.0 lb  Date of Birth:  16-Jun-1945     BSA:          2.161 m  Patient Age:    77 years      BP:           147/91 mmHg  Patient Gender: M             HR:           78 bpm.  Exam Location:  [address]   Procedure: 2D Echo, Cardiac Doppler and Color Doppler   Indications:    Q23.1 Bicuspid aortic valve    History:        Patient has prior history of Echocardiogram examinations,  most                 recent 08/22/2020. Thoracic aortic aneurysm measures 48 mm  by CTA                  performed 09/02/21, Bicuspid aortic stenosis,  Arrythmias:Atrial                 Fibrillation; Risk Factors:Dyslipidemia and Hypertension.                  Previous echo LVEF 60% 3D 57% -21.4 %  PAP 31 mmHg. AV mean                  gradient 25 mmHg.    Sonographer:    Chanetta Marshall BA, RDCS  Referring Phys: [phone] JONATHAN J BERRY   IMPRESSIONS     1. Left ventricular ejection fraction, by estimation, is 60 to 65%. The  left ventricle has normal function. The left ventricle has no regional  wall motion abnormalities. There is mild left ventricular hypertrophy of  the basal-septal segment. Left  ventricular diastolic parameters are indeterminate.   2. Right ventricular systolic function is normal. The right ventricular  size is normal.   3. The mitral valve is normal in structure. Mild mitral valve  regurgitation. No evidence of mitral stenosis.   4. The aortic valve is bicuspid. Aortic valve regurgitation is mild.  Moderate aortic valve stenosis.   5. Aortic dilatation noted. There is moderate dilatation of the aortic  root, measuring 49 mm. There is severe dilatation of the ascending aorta,  measuring 50 mm.   FINDINGS   Left Ventricle: Left ventricular ejection fraction, by estimation, is 60  to 65%. The left ventricle has normal function. The left ventricle has no  regional wall motion abnormalities. The left ventricular internal cavity  size was normal in size. There is   mild left ventricular hypertrophy of the basal-septal segment. Left  ventricular diastolic function could not be evaluated due to atrial  fibrillation. Left ventricular diastolic parameters are indeterminate.   Right Ventricle: The right ventricular size is normal. Right ventricular  systolic function is normal.   Left Atrium: Left atrial size was normal in size.   Right Atrium: Right atrial size was normal in size.   Pericardium: There is no evidence of pericardial effusion.   Mitral Valve: The mitral valve is normal in structure. Mild mitral valve  regurgitation. No evidence of mitral valve stenosis.   Tricuspid Valve: The tricuspid valve is normal in structure. Tricuspid  valve  regurgitation is mild . No evidence of tricuspid stenosis.   Aortic Valve: The aortic  valve is bicuspid. Aortic valve regurgitation is  mild. Moderate aortic stenosis is present. Aortic valve mean gradient  measures 25.0 mmHg. Aortic valve peak gradient measures 41.0 mmHg. Aortic  valve area, by VTI measures 1.19  cm.   Pulmonic Valve: The pulmonic valve was normal in structure. Pulmonic valve  regurgitation is trivial. No evidence of pulmonic stenosis.   Aorta: Aortic dilatation noted. There is moderate dilatation of the aortic  root, measuring 49 mm. There is severe dilatation of the ascending aorta,  measuring 50 mm.   Venous: The inferior vena cava was not well visualized.   IAS/Shunts: The interatrial septum was not well visualized.     LEFT VENTRICLE  PLAX 2D  LVIDd:         4.60 cm   Diastology  LVIDs:         2.80 cm   LV e' lateral: 7.90 cm/s  LV PW:         1.00 cm  LV IVS:        1.40 cm  LVOT diam:     2.60 cm  LV SV:         79  LV SV Index:   36  LVOT Area:     5.31 cm     RIGHT VENTRICLE  RV Basal diam:  4.20 cm  RV S prime:     10.04 cm/s  TAPSE (M-mode): 1.8 cm  RVSP:           24.9 mmHg   LEFT ATRIUM             Index        RIGHT ATRIUM           Index  LA diam:        3.80 cm 1.76 cm/m   RA Pressure: 3.00 mmHg  LA Vol (A2C):   43.3 ml 20.04 ml/m  RA Area:     18.30 cm  LA Vol (A4C):   46.7 ml 21.61 ml/m  RA Volume:   44.00 ml  20.36 ml/m  LA Biplane Vol: 46.3 ml 21.43 ml/m   AORTIC VALVE  AV Area (Vmax):    1.18 cm  AV Area (Vmean):   1.27 cm  AV Area (VTI):     1.19 cm  AV Vmax:           320.00 cm/s  AV Vmean:          203.800 cm/s  AV VTI:            0.660 m  AV Peak Grad:      41.0 mmHg  AV Mean Grad:      25.0 mmHg  LVOT Vmax:         71.30 cm/s  LVOT Vmean:        48.725 cm/s  LVOT VTI:          0.148 m  LVOT/AV VTI ratio: 0.22    AORTA  Ao Root diam:  4.80 cm  Ao Sinus diam: 4.90 cm  Ao STJ diam:   3.6 cm  Ao Asc diam:    5.00 cm   TRICUSPID VALVE  TR Peak grad:   21.9 mmHg  TR Vmax:        234.00 cm/s  Estimated RAP:  3.00 mmHg  RVSP:           24.9 mmHg    SHUNTS  Systemic VTI:  0.15 m  Systemic Diam: 2.60 cm  Olga Millers MD  Electronically signed by Olga Millers MD  Signature Date/Time: 09/12/2022/2:31:20 PM        Final      Narrative & Impression  CLINICAL DATA:  77 year old male with aortic aneurysm   EXAM: CT ANGIOGRAPHY CHEST WITH CONTRAST   TECHNIQUE: Multidetector CT imaging of the chest was performed using the standard protocol during bolus administration of intravenous contrast. Multiplanar CT image reconstructions and MIPs were obtained to evaluate the vascular anatomy.   RADIATION DOSE REDUCTION: This exam was performed according to the departmental dose-optimization program which includes automated exposure control, adjustment of the mA and/or kV according to patient size and/or use of iterative reconstruction technique.   CONTRAST:  75mL OMNIPAQUE IOHEXOL 350 MG/ML SOLN   COMPARISON:  09/12/2021, 09/06/2020, 02/09/2020   FINDINGS: Cardiovascular:   Heart:   Cardiomegaly. No pericardial fluid/thickening. Calcifications of the left anterior descending and circumflex coronary arteries.   Aorta:   Dense calcifications of the aortic valve leaflets, particularly anterior.   Greatest estimated diameter of the aortic annulus 37 mm on the coronal reformatted images.   Greatest estimated diameter of the sino-tubular junction, 37 mm on the coronal images.   Greatest estimated diameter of the ascending aorta on the axial images, 47 mm.   Mild atherosclerotic changes of the aorta. Branch vessels are patent with a 3 vessel arch. Cervical cerebral vessels patent at the base of the neck.   No pedunculated plaque, ulcerated plaque, dissection, periaortic fluid. No wall thickening.   Pulmonary arteries:   Timing of the contrast bolus is not optimized for  evaluation of pulmonary artery filling defects. Unremarkable size of the main pulmonary artery.   Mediastinum/Nodes: No mediastinal adenopathy. Unremarkable appearance of the thoracic esophagus.   Unremarkable appearance of the thoracic inlet.   Lungs/Pleura: Central airways are clear. No pleural effusion. No confluent airspace disease.   No pneumothorax.   Upper Abdomen: No acute finding of the upper abdomen. Low-density/cystic lesions of the liver, compatible with benign biliary cysts and have been present from baseline CT studies relatively unchanged.   Musculoskeletal: No acute displaced fracture. Degenerative changes of the spine.   Review of the MIP images confirms the above findings.   IMPRESSION: Relatively unchanged size and configuration of the ascending aorta, estimated 4.7 cm on the current CT. Ascending thoracic aortic aneurysm. Recommend semi-annual imaging followup by CTA or MRA and referral to cardiothoracic surgery if not already obtained. This recommendation follows 2010 ACCF/AHA/AATS/ACR/ASA/SCA/SCAI/SIR/STS/SVM Guidelines for the Diagnosis and Management of Patients With Thoracic Aortic Disease. Circulation. 2010; 121: Y865-H846. Aortic aneurysm NOS (ICD10-I71.9)   Redemonstration dense calcifications of the aortic valve, potentially representing a functionally bicuspid valve, and correlation with ECHO may be useful if not already performed.   Coronary artery disease and aortic atherosclerosis. Aortic Atherosclerosis (ICD10-I70.0).   Signed,   Yvone Neu. Miachel Roux, RPVI   [facility]   [facility]     Electronically Signed   By: Gilmer Mor D.O.   On: 10/10/2022 14:38     Impression:  This 77 year old gentleman has a bicuspid aortic valve with a mean gradient of 25 mmHg consistent with moderate aortic stenosis as well as mild aortic insufficiency.  This is not changed significantly from  a year ago.  His echo was read as showing an aortic root diameter of 4.9 cm and an ascending aortic diameter of 5 cm.  CTA of the chest done 10/04/2022 showed an aortic root diameter of 4.9  cm and an ascending aortic diameter of 4.7 cm which is unchanged from a year ago.  He remains asymptomatic and continues to be quite active.  There is no clear indication for aortic valve replacement at this time unless he develops progressive symptoms.  His aortic aneurysm is still below the surgical threshold of 5 cm with a bicuspid aortic valve although just barely.  I reviewed the echo and CT images with the patient and his wife.  I discussed the alternatives of proceeding with surgery at this time versus continuing to follow this.  Since he is 20 and asymptomatic I think it is reasonable to continue following it with a repeat echocardiogram in 6 months and CTA of the chest in 1 year.  I discussed the importance of continued good blood pressure control in preventing further enlargement of his aorta and acute aortic dissection.  I discussed the symptoms of progressive aortic stenosis and aortic insufficiency with him and advised him to let us know if he develops any of them.  Plan:  He will continue to follow-up with Dr. Allyson Sabal and will need a repeat echocardiogram every 6 months to follow his bicuspid aortic valve stenosis and insufficiency.  I will plan to see him back in 1 year with a CTA of the chest for aortic surveillance.   I spent 15 minutes performing this established patient evaluation and > 50% of this time was spent face to face counseling and coordinating the care of this patient's aortic aneurysm and bicuspid aortic valve stenosis and insufficiency.    Alleen Borne, MD [facility] [phone]

## 2022-10-14 ENCOUNTER — Other Ambulatory Visit: Payer: Self-pay | Admitting: Cardiovascular Disease

## 2022-10-29 DIAGNOSIS — C44319 Basal cell carcinoma of skin of other parts of face: Secondary | ICD-10-CM | POA: Diagnosis not present

## 2022-10-29 DIAGNOSIS — L570 Actinic keratosis: Secondary | ICD-10-CM | POA: Diagnosis not present

## 2022-10-29 DIAGNOSIS — L821 Other seborrheic keratosis: Secondary | ICD-10-CM | POA: Diagnosis not present

## 2022-10-29 DIAGNOSIS — Z08 Encounter for follow-up examination after completed treatment for malignant neoplasm: Secondary | ICD-10-CM | POA: Diagnosis not present

## 2022-10-29 DIAGNOSIS — L814 Other melanin hyperpigmentation: Secondary | ICD-10-CM | POA: Diagnosis not present

## 2022-10-29 DIAGNOSIS — Z85820 Personal history of malignant melanoma of skin: Secondary | ICD-10-CM | POA: Diagnosis not present

## 2022-10-29 DIAGNOSIS — D225 Melanocytic nevi of trunk: Secondary | ICD-10-CM | POA: Diagnosis not present

## 2022-10-29 DIAGNOSIS — Z85828 Personal history of other malignant neoplasm of skin: Secondary | ICD-10-CM | POA: Diagnosis not present

## 2022-10-29 DIAGNOSIS — D485 Neoplasm of uncertain behavior of skin: Secondary | ICD-10-CM | POA: Diagnosis not present

## 2022-11-13 DIAGNOSIS — C44319 Basal cell carcinoma of skin of other parts of face: Secondary | ICD-10-CM | POA: Diagnosis not present

## 2022-11-18 ENCOUNTER — Other Ambulatory Visit: Payer: Self-pay | Admitting: Cardiovascular Disease

## 2022-11-18 DIAGNOSIS — I480 Paroxysmal atrial fibrillation: Secondary | ICD-10-CM

## 2022-11-18 NOTE — Telephone Encounter (Signed)
Prescription refill request for Eliquis received. Indication: PAF Last office visit: 10/01/22  Erlene Quan MD Scr: 0.78 on 02/15/22  Epic Age: 77 Weight: 90.3kg  Based on above findings Eliquis 5mg  twice daily is the appropriate dose.  Refill approved.

## 2023-01-23 ENCOUNTER — Ambulatory Visit: Payer: PPO | Admitting: *Deleted

## 2023-01-23 DIAGNOSIS — Z0000 Encounter for general adult medical examination without abnormal findings: Secondary | ICD-10-CM

## 2023-01-23 NOTE — Progress Notes (Signed)
 Subjective:   Jerry Velazquez is a 78 y.o. male who presents for Medicare Annual/Subsequent preventive examination.  Visit Complete: Virtual I connected with  Jerry Velazquez on 01/23/23 by a video and audio enabled telemedicine application and verified that I am speaking with the correct person using two identifiers.  Patient Location: Home  Provider Location: Home Office  I discussed the limitations of evaluation and management by telemedicine. The patient expressed understanding and agreed to proceed.  Vital Signs: Because this visit was a virtual/telehealth visit, some criteria may be missing or patient reported. Any vitals not documented were not able to be obtained and vitals that have been documented are patient reported.  Patient Medicare AWV questionnaire was completed by the patient on 01-23-2023; I have confirmed that all information answered by patient is correct and no changes since this date.  Cardiac Risk Factors include: advanced age (>41men, >74 women);male gender;family history of premature cardiovascular disease;hypertension     Objective:    There were no vitals filed for this visit. There is no height or weight on file to calculate BMI.     01/23/2023   11:14 AM 01/17/2022   11:47 AM 07/05/2021   12:12 PM 01/11/2021   11:24 AM 11/25/2019    9:34 AM 09/17/2017    8:35 AM  Advanced Directives  Does Patient Have a Medical Advance Directive? Yes Yes Yes Yes Yes No  Type of Advance Directive Healthcare Power of Attorney Living will;Healthcare Power of [employer] Power of [place];Living will Healthcare Power of [place];Out of facility DNR (pink MOST or yellow form);Living will   Does patient want to make changes to medical advance directive?  No - Patient declined      Copy of Healthcare Power of Attorney in Chart? No - copy requested Yes - validated most recent copy scanned in chart (See row information)  Yes - validated most recent copy scanned in chart (See row  information)    Would patient like information on creating a medical advance directive?      Yes (MAU/Ambulatory/Procedural Areas - Information given)    Current Medications (verified) Outpatient Encounter Medications as of 01/23/2023  Medication Sig   atorvastatin  (LIPITOR) 20 MG tablet TAKE 1 TABLET BY MOUTH EVERY DAY   cyclobenzaprine  (FLEXERIL ) 10 MG tablet Take 1 tablet (10 mg total) by mouth 3 (three) times daily as needed for muscle spasms.   ELIQUIS  5 MG TABS tablet TAKE 1 TABLET BY MOUTH TWICE A DAY   GLUCOSAMINE HCL PO Take 2,000 mg by mouth daily.   lisinopril  (ZESTRIL ) 5 MG tablet TAKE 1 TABLET BY MOUTH EVERY DAY   Omega-3 Fatty Acids (FISH OIL) 1000 MG CAPS Take 1,000 mg by mouth daily.   PFIZER COVID-19 VAC BIVALENT injection    metoprolol  tartrate (LOPRESSOR ) 25 MG tablet Take 0.5 tablets (12.5 mg total) by mouth every 6 (six) hours as needed (palpitations).   No facility-administered encounter medications on file as of 01/23/2023.    Allergies (verified) Patient has no known allergies.   History: Past Medical History:  Diagnosis Date   Aortic aneurysm (HCC)    Aortic insufficiency due to bicuspid aortic valve    Aortic stenosis due to bicuspid aortic valve    Atrial flutter (HCC)    Bicuspid aortic valve    GERD (gastroesophageal reflux disease)    past hx- stopped alcohol and no more issues    NSVT (nonsustained ventricular tachycardia) (HCC)    Paroxysmal atrial fibrillation (HCC)  PVC's (premature ventricular contractions)    RBBB    Past Surgical History:  Procedure Laterality Date   COLONOSCOPY     12 yrs ago [facility]- normal per pt    CYST EXCISION     left knee    CYST EXCISION     tongue   Family History  Problem Relation Age of Onset   Heart disease Mother    Hypertension Father    Stroke Father    COPD Sister    Colon polyps Sister    Cervical cancer Paternal Grandmother    Hypertension Brother    COPD Brother    Skin cancer Brother     Colon cancer Neg Hx    Esophageal cancer Neg Hx    Rectal cancer Neg Hx    Stomach cancer Neg Hx    Social History   Socioeconomic History   Marital status: Married    Spouse name: [name]   Number of children: 0   Years of education: Not on file   Highest education level: Bachelor's degree (e.g., BA, AB, BS)  Occupational History   Occupation: retired    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   Social Drivers of Corporate Investment Banker Strain: Low Risk  (01/23/2023)   Overall Financial Resource Strain (CARDIA)    Difficulty of Paying Living Expenses: Not hard at all  Food Insecurity: No Food Insecurity (01/23/2023)   Hunger Vital Sign    Worried About Running Out of Food in the Last Year: Never true    Ran Out of Food in the Last Year: Never true  Transportation Needs: No Transportation Needs (01/23/2023)   PRAPARE - Administrator, Civil Service (Medical): No    Lack of Transportation (Non-Medical): No  Physical Activity: Sufficiently Active (01/23/2023)   Exercise Vital Sign    Days of Exercise per Week: 6 days    Minutes of Exercise per Session: 30 min  Stress: No Stress Concern Present (01/23/2023)   [name] of Occupational Health - Occupational Stress Questionnaire    Feeling of Stress : Not at all  Social Connections: Moderately Integrated (01/23/2023)   Social Connection and Isolation Panel [NHANES]    Frequency of Communication with Friends and Family: More than three times a week    Frequency  of Social Gatherings with Friends and Family: Three times a week    Attends Religious Services: Never    Active Member of Clubs or Organizations: Yes    Attends Engineer, Structural: More than 4 times per year    Marital Status: Married    Tobacco Counseling Counseling given: Not Answered Tobacco comments: smoked a pipe at age 57 to 30   Clinical Intake:  Pre-visit preparation completed: Yes  Pain : No/denies pain     Diabetes: No  How often do you need to have someone help  you when you read instructions, pamphlets, or other written materials from your doctor or pharmacy?: 1 - Never  Interpreter Needed?: No  Information entered by :: Mliss Graff LPN   Activities of Daily Living    01/23/2023   11:14 AM 01/23/2023    9:35 AM  In your present state of health, do you have any difficulty performing the following activities:  Hearing? 0 0  Vision? 0 0  Difficulty concentrating or making decisions? 0 0  Walking or climbing stairs? 0 0  Dressing or bathing? 0 0  Doing errands, shopping? 0 0  Preparing Food and eating ? N N  Using the Toilet? N N  In the past six months, have you accidently leaked urine? N N  Do you have problems with loss of bowel control? N N  Managing your Medications? N N  Managing your Finances? N N  Housekeeping or managing your Housekeeping? N N    Patient Care Team: [name], MD as PCP - General (Family Medicine) Court Dorn PARAS, MD as PCP - Cardiology (Cardiology) Leavy Mole, PA-C as Physician Assistant (Family Medicine) Shaaron Lamar HERO, MD as Consulting Physician (Gastroenterology) Nicholaus Sherlean CROME, [facility] (Inactive) as Pharmacist (Pharmacist)  Indicate any recent Medical Services you may have received from other than [facility] providers in the past year (date may be approximate).     Assessment:   This is a routine wellness examination for Attila.  Hearing/Vision screen Hearing Screening - Comments:: Bilateral hearing  aids Vision Screening - Comments:: Up to date [facility]   Goals Addressed             This Visit's Progress    Patient Stated       Maintain current lifestyle       Depression Screen    01/23/2023   11:02 AM 01/17/2022   11:50 AM 01/11/2021   11:20 AM 05/25/2020    8:04 AM 11/25/2019    9:34 AM 09/17/2017    7:59 AM 09/03/2017    8:22 AM  PHQ 2/9 Scores  PHQ - 2 Score 0 0 0 0 0 0 0  PHQ- 9 Score 2          Fall Risk    01/23/2023   11:01 AM 01/23/2023    9:35 AM 01/17/2022   11:40 AM 01/11/2021   11:24 AM 05/25/2020    8:04 AM  Fall Risk   Falls in the past year? 0 0 0 0 0  Number falls in past yr: 1 1 0 0   Injury with Fall? 0 0 0 0   Risk for fall due to :   No Fall Risks No Fall Risks No Fall Risks  Follow up Falls evaluation completed;Education provided;Falls prevention discussed  Falls evaluation completed;Education provided;Falls prevention discussed Falls prevention discussed Falls evaluation completed    MEDICARE RISK AT HOME: Medicare Risk at Home Any stairs in or around the home?: (Patient-Rptd) Yes If so, are there any without handrails?: (Patient-Rptd) No Home free of loose throw rugs in walkways, pet beds, electrical cords, etc?: (Patient-Rptd) Yes Adequate lighting in your home to reduce risk of falls?: (Patient-Rptd) Yes Life alert?: (Patient-Rptd) No Use of a cane, walker or w/c?: (Patient-Rptd) No Grab bars in the bathroom?: (Patient-Rptd) No Shower chair or bench in shower?: (Patient-Rptd) No Elevated toilet seat or a handicapped toilet?: (Patient-Rptd) No  TIMED UP AND GO:  Was the test performed?  No    Cognitive Function:  01/23/2023   11:01 AM 01/17/2022   11:49 AM 11/25/2019    9:35 AM  6CIT Screen  What Year? 0 points 0 points 0 points  What month? 0 points 0 points 0 points  What time? 0 points 0 points 0 points  Count back from 20 0 points 0 points 0 points  Months in reverse 0 points 0 points 0 points  Repeat phrase 0 points 0  points 0 points  Total Score 0 points 0 points 0 points    Immunizations Immunization History  Administered Date(s) Administered   Fluad Quad(high Dose 65+) 09/15/2018, 10/22/2021   Influenza, High Dose Seasonal PF 12/20/2022   Influenza-Unspecified 10/23/2020   Moderna Sars-Covid-2 Vaccination 03/27/2019, 11/08/2019, 06/05/2020   Pfizer Covid-19 Vaccine Bivalent Booster 29yrs & up 10/23/2020   Pfizer(Comirnaty)Fall Seasonal Vaccine 12 years and older 10/22/2021, 01/04/2023   Pneumococcal Polysaccharide-23 09/15/2018   Respiratory Syncytial Virus Vaccine,Recomb Aduvanted(Arexvy) 10/22/2021   Tdap 12/20/2022   Zoster Recombinant(Shingrix) 07/24/2020, 01/24/2021    TDAP status: Up to date  Flu Vaccine status: Up to date  Pneumococcal vaccine status: Up to date  Covid-19 vaccine status: Completed vaccines  Qualifies for Shingles Vaccine? No   Zostavax completed Yes   Shingrix Completed?: Yes  Screening Tests Health Maintenance  Topic Date Due   Pneumonia Vaccine 23+ Years old (2 of 2 - PCV) 01/23/2024 (Originally 09/15/2019)   Medicare Annual Wellness (AWV)  01/23/2024   DTaP/Tdap/Td (2 - Td or Tdap) 12/19/2032   INFLUENZA VACCINE  Completed   COVID-19 Vaccine  Completed   Hepatitis C Screening  Completed   Zoster Vaccines- Shingrix  Completed   HPV VACCINES  Aged Out   Fecal DNA (Cologuard)  Discontinued    Health Maintenance  There are no preventive care reminders to display for this patient.   Colorectal cancer screening: No longer required.   Lung Cancer Screening: (Low Dose CT Chest recommended if Age 73-80 years, 20 pack-year currently smoking OR have quit w/in 15years.) does not qualify.   Lung Cancer Screening Referral:   Additional Screening:  Hepatitis C Screening: does not qualify; Completed 2019  Vision Screening: Recommended annual ophthalmology exams for early detection of glaucoma and other disorders of the eye. Is the patient up to date with  their annual eye exam?  Yes  Who is the provider or what is the name of the office in which the patient attends annual eye exams? [facility] If pt is not established with a provider, would they like to be referred to a provider to establish care? No .   Dental Screening: Recommended annual dental exams for proper oral hygiene    Community Resource Referral / Chronic Care Management: CRR required this visit?  No   CCM required this visit?  No     Plan:     I have personally reviewed and noted the following in the patient's chart:   Medical and social history Use of alcohol, tobacco or illicit drugs  Current medications and supplements including opioid prescriptions. Patient is not currently taking opioid prescriptions. Functional ability and status Nutritional status Physical activity Advanced directives List of other physicians Hospitalizations, surgeries, and ER visits in previous 12 months Vitals Screenings to include cognitive, depression, and falls Referrals and appointments  In addition, I have reviewed and discussed with patient certain preventive protocols, quality metrics, and best practice recommendations. A written personalized care plan for preventive services as well as general preventive health recommendations were provided to patient.  Mliss Graff, LPN   8/[id number]/[id number]   After Visit Summary: (MyChart) Due to this being a telephonic visit, the after visit summary with patients personalized plan was offered to patient via MyChart   Nurse Notes:

## 2023-01-23 NOTE — Patient Instructions (Signed)
 [name] , Thank you for taking time to come for your Medicare Wellness Visit. I appreciate your ongoing commitment to your health goals. Please review the following plan we discussed and let me know if I can assist you in the future.   Screening recommendations/referrals: Colonoscopy: no longer required Recommended yearly ophthalmology/optometry visit for glaucoma screening and checkup Recommended yearly dental visit for hygiene and checkup  Vaccinations: Influenza vaccine: up to date Pneumococcal vaccine: up to date Tdap vaccine: is up to date  not due until 2034 Shingles vaccine: up to date     Preventive Care 65 Years and Older, Male Preventive care refers to lifestyle choices and visits with your health care provider that can promote health and wellness. What does preventive care include? A yearly physical exam. This is also called an annual well check. Dental exams once or twice a year. Routine eye exams. Ask your health care provider how often you should have your eyes checked. Personal lifestyle choices, including: Daily care of your teeth and gums. Regular physical activity. Eating a healthy diet. Avoiding tobacco and drug use. Limiting alcohol use. Practicing safe sex. Taking low doses of aspirin every day. Taking vitamin and mineral supplements as recommended by your health care provider. What happens during an annual well check? The services and screenings done by your health care provider during your annual well check will depend on your age, overall health, lifestyle risk factors, and family history of disease. Counseling  Your health care provider may ask you questions about your: Alcohol use. Tobacco use. Drug use. Emotional well-being. Home and relationship well-being. Sexual activity. Eating habits. History of falls. Memory and ability to understand (cognition). Work and work astronomer. Screening  You may have the following tests or  measurements: Height, weight, and BMI. Blood pressure. Lipid and cholesterol levels. These may be checked every 5 years, or more frequently if you are over 3 years old. Skin check. Lung cancer screening. You may have this screening every year starting at age 41 if you have a 30-pack-year history of smoking and currently smoke or have quit within the past 15 years. Fecal occult blood test (FOBT) of the stool. You may have this test every year starting at age 36. Flexible sigmoidoscopy or colonoscopy. You may have a sigmoidoscopy every 5 years or a colonoscopy every 10 years starting at age 36. Prostate cancer screening. Recommendations will vary depending on your family history and other risks. Hepatitis C blood test. Hepatitis B blood test. Sexually transmitted disease (STD) testing. Diabetes screening. This is done by checking your blood sugar (glucose) after you have not eaten for a while (fasting). You may have this done every 1-3 years. Abdominal aortic aneurysm (AAA) screening. You may need this if you are a current or former smoker. Osteoporosis. You may be screened starting at age 35 if you are at high risk. Talk with your health care provider about your test results, treatment options, and if necessary, the need for more tests. Vaccines  Your health care provider may recommend certain vaccines, such as: Influenza vaccine. This is recommended every year. Tetanus, diphtheria, and acellular pertussis (Tdap, Td) vaccine. You may need a Td booster every 10 years. Zoster vaccine. You may need this after age 4. Pneumococcal 13-valent conjugate (PCV13) vaccine. One dose is recommended after age 72. Pneumococcal polysaccharide (PPSV23) vaccine. One dose is recommended after age 5. Talk to your health care provider about which screenings and vaccines you need and how often you need them.  This information is not intended to replace advice given to you by your health care provider. Make sure  you discuss any questions you have with your health care provider. Document Released: 01/27/2015 Document Revised: 09/20/2015 Document Reviewed: 11/01/2014 Elsevier Interactive Patient Education  2017 [employer].  Fall Prevention in the Home Falls can cause injuries. They can happen to people of all ages. There are many things you can do to make your home safe and to help prevent falls. What can I do on the outside of my home? Regularly fix the edges of walkways and driveways and fix any cracks. Remove anything that might make you trip as you walk through a door, such as a raised step or threshold. Trim any bushes or trees on the path to your home. Use bright outdoor lighting. Clear any walking paths of anything that might make someone trip, such as rocks or tools. Regularly check to see if handrails are loose or broken. Make sure that both sides of any steps have handrails. Any raised decks and porches should have guardrails on the edges. Have any leaves, snow, or ice cleared regularly. Use sand or salt on walking paths during winter. Clean up any spills in your garage right away. This includes oil or grease spills. What can I do in the bathroom? Use night lights. Install grab bars by the toilet and in the tub and shower. Do not use towel bars as grab bars. Use non-skid mats or decals in the tub or shower. If you need to sit down in the shower, use a plastic, non-slip stool. Keep the floor dry. Clean up any water that spills on the floor as soon as it happens. Remove soap buildup in the tub or shower regularly. Attach bath mats securely with double-sided non-slip rug tape. Do not have throw rugs and other things on the floor that can make you trip. What can I do in the bedroom? Use night lights. Make sure that you have a light by your bed that is easy to reach. Do not use any sheets or blankets that are too big for your bed. They should not hang down onto the floor. Have a firm  chair that has side arms. You can use this for support while you get dressed. Do not have throw rugs and other things on the floor that can make you trip. What can I do in the kitchen? Clean up any spills right away. Avoid walking on wet floors. Keep items that you use a lot in easy-to-reach places. If you need to reach something above you, use a strong step stool that has a grab bar. Keep electrical cords out of the way. Do not use floor polish or wax that makes floors slippery. If you must use wax, use non-skid floor wax. Do not have throw rugs and other things on the floor that can make you trip. What can I do with my stairs? Do not leave any items on the stairs. Make sure that there are handrails on both sides of the stairs and use them. Fix handrails that are broken or loose. Make sure that handrails are as long as the stairways. Check any carpeting to make sure that it is firmly attached to the stairs. Fix any carpet that is loose or worn. Avoid having throw rugs at the top or bottom of the stairs. If you do have throw rugs, attach them to the floor with carpet tape. Make sure that you have a light switch at the  top of the stairs and the bottom of the stairs. If you do not have them, ask someone to add them for you. What else can I do to help prevent falls? Wear shoes that: Do not have high heels. Have rubber bottoms. Are comfortable and fit you well. Are closed at the toe. Do not wear sandals. If you use a stepladder: Make sure that it is fully opened. Do not climb a closed stepladder. Make sure that both sides of the stepladder are locked into place. Ask someone to hold it for you, if possible. Clearly mark and make sure that you can see: Any grab bars or handrails. First and last steps. Where the edge of each step is. Use tools that help you move around (mobility aids) if they are needed. These include: Canes. Walkers. Scooters. Crutches. Turn on the lights when you go  into a dark area. Replace any light bulbs as soon as they burn out. Set up your furniture so you have a clear path. Avoid moving your furniture around. If any of your floors are uneven, fix them. If there are any pets around you, be aware of where they are. Review your medicines with your doctor. Some medicines can make you feel dizzy. This can increase your chance of falling. Ask your doctor what other things that you can do to help prevent falls. This information is not intended to replace advice given to you by your health care provider. Make sure you discuss any questions you have with your health care provider. Document Released: 10/27/2008 Document Revised: 06/08/2015 Document Reviewed: 02/04/2014 Elsevier Interactive Patient Education  2017 [employer].

## 2023-05-01 DIAGNOSIS — Z85828 Personal history of other malignant neoplasm of skin: Secondary | ICD-10-CM | POA: Diagnosis not present

## 2023-05-01 DIAGNOSIS — L570 Actinic keratosis: Secondary | ICD-10-CM | POA: Diagnosis not present

## 2023-05-01 DIAGNOSIS — Z86006 Personal history of melanoma in-situ: Secondary | ICD-10-CM | POA: Diagnosis not present

## 2023-05-01 DIAGNOSIS — D225 Melanocytic nevi of trunk: Secondary | ICD-10-CM | POA: Diagnosis not present

## 2023-05-01 DIAGNOSIS — Z08 Encounter for follow-up examination after completed treatment for malignant neoplasm: Secondary | ICD-10-CM | POA: Diagnosis not present

## 2023-05-01 DIAGNOSIS — L821 Other seborrheic keratosis: Secondary | ICD-10-CM | POA: Diagnosis not present

## 2023-05-01 DIAGNOSIS — L820 Inflamed seborrheic keratosis: Secondary | ICD-10-CM | POA: Diagnosis not present

## 2023-05-01 DIAGNOSIS — L538 Other specified erythematous conditions: Secondary | ICD-10-CM | POA: Diagnosis not present

## 2023-05-01 DIAGNOSIS — L814 Other melanin hyperpigmentation: Secondary | ICD-10-CM | POA: Diagnosis not present

## 2023-05-14 ENCOUNTER — Other Ambulatory Visit: Payer: Self-pay | Admitting: Cardiovascular Disease

## 2023-05-14 DIAGNOSIS — I480 Paroxysmal atrial fibrillation: Secondary | ICD-10-CM

## 2023-05-15 NOTE — Telephone Encounter (Signed)
 Pt returned call. Pt states he can come to [facility] lab within the next month to have labs drawn. Lab orders placed. Refill sent to prevent any missed doses.

## 2023-05-15 NOTE — Telephone Encounter (Signed)
 Prescription refill request for Eliquis  received. Indication: Afib Last office visit: 10/01/22 Katheryne Pane) Scr: 0.78 (02/15/22) Age: 78 Weight: 90.3kg  Labs overdue.

## 2023-05-15 NOTE — Telephone Encounter (Signed)
 Called pt, no answer. Left message on voicemail.

## 2023-05-19 ENCOUNTER — Encounter: Payer: Self-pay | Admitting: Cardiovascular Disease

## 2023-05-28 ENCOUNTER — Ambulatory Visit: Attending: Cardiovascular Disease | Admitting: Cardiovascular Disease

## 2023-05-28 ENCOUNTER — Encounter: Payer: Self-pay | Admitting: Cardiovascular Disease

## 2023-05-28 VITALS — BP 124/64 | HR 73 | Ht 73.0 in | Wt 194.6 lb

## 2023-05-28 DIAGNOSIS — I480 Paroxysmal atrial fibrillation: Secondary | ICD-10-CM | POA: Diagnosis not present

## 2023-05-28 DIAGNOSIS — E785 Hyperlipidemia, unspecified: Secondary | ICD-10-CM | POA: Diagnosis not present

## 2023-05-28 DIAGNOSIS — I10 Essential (primary) hypertension: Secondary | ICD-10-CM | POA: Diagnosis not present

## 2023-05-28 DIAGNOSIS — I7121 Aneurysm of the ascending aorta, without rupture: Secondary | ICD-10-CM | POA: Diagnosis not present

## 2023-05-28 DIAGNOSIS — I471 Supraventricular tachycardia, unspecified: Secondary | ICD-10-CM

## 2023-05-28 DIAGNOSIS — R42 Dizziness and giddiness: Secondary | ICD-10-CM

## 2023-05-28 DIAGNOSIS — I351 Nonrheumatic aortic (valve) insufficiency: Secondary | ICD-10-CM

## 2023-05-28 NOTE — Assessment & Plan Note (Signed)
 History of persistent A-fib rate controlled on Eliquis  oral anticoagulation.  He has complained of episodes of dizziness.  I am going to get a 30-day event monitor to further evaluate.  He also complains of amaurosis although he is had this for many years and is already on oral anticoagulation.

## 2023-05-28 NOTE — Assessment & Plan Note (Signed)
 History of a ascending thoracic aortic aneurysm measuring 47 mm by CTA and 50 mm by 2D echo in August and September respectively of last year.  This has remained stable.  It is followed on an annual basis.  Dr. Sherene Dilling is also following along.

## 2023-05-28 NOTE — Assessment & Plan Note (Signed)
History of dyslipidemia on statin therapy followed by his PCP 

## 2023-05-28 NOTE — Progress Notes (Signed)
 05/28/2023 Jerry Velazquez   February 15, 1945  [id number]  Primary Physician Jerry Cork [place], MD Primary Cardiologist: Avanell Leigh MD Bennye Bravo, [place]  HPI:  Jerry Velazquez is a 78 y.o.   thin appearing married Caucasian male father of no children who is retired Production manager.  He was referred by Adeline Hone, PA-C for cardiovascular evaluation because of aortic insufficiency.    I last  saw him in the office 10/01/2022.  He has no cardiac risk factors.  He was seen because of palpitations with a recent Holter monitor that showed PVCs and short atrial runs.  2D echo performed because of his soft murmur revealed a bicuspid aortic valve with mild stenosis and mild to moderate aortic insufficiency with normal LV size and function.  There was noted mild dilatation of the thoracic aorta.  He also was experiences a quivering sensation in his chest.  He has denied chest pain or shortness of breath in the past.   I referred him to Dr. Nunzio Belch and Dr. Sherene Dilling as well.  Ultimately he was begun on Eliquis  oral anticoagulation.  A chest CTA was performed 10/31/2017 revealing a 4.9 cm ascending thoracic aortic aneurysm.  This will be followed on an semiannual basis.  He will ultimately need thoracic aortic repair as well as AVR.  It is possible that he would also benefit from a surgical maze procedure during the operation and removal of his left atrial appendage but I will leave this to Dr. Sherene Dilling to decide.   Since I  saw him 8 months ago he continues to do well.  [name]  He is very active.  Recent 2D echo performed 09/12/2022 revealed normal LV systolic function, grade 1 diastolic dysfunction, mild to moderate AI, moderate AS (bicuspid aortic valve) with an aortic valve area of 1.19 cm cm and a peak gradient of 41 mmHg.  This was unchanged from his prior echo.  Recent CTA performed 09/12/2021 revealed his thoracic aorta measured 48 mm, not at threshold for consideration for replacement.  He is  followed by Dr. Everlina Hock office.  He has complained of several episodes of dizziness while at rest and also amaurosis which she has had for many years.  He says that his exercise stamina has mildly reduced.  He is very active.   Current Meds  Medication Sig   atorvastatin  (LIPITOR) 20 MG tablet TAKE 1 TABLET BY MOUTH EVERY DAY   cyclobenzaprine  (FLEXERIL ) 10 MG tablet Take 1 tablet (10 mg total) by mouth 3 (three) times daily as needed for muscle spasms.   ELIQUIS  5 MG TABS tablet TAKE 1 TABLET BY MOUTH TWICE A DAY   GLUCOSAMINE HCL PO Take 2,000 mg by mouth daily.   lisinopril  (ZESTRIL ) 5 MG tablet TAKE 1 TABLET BY MOUTH EVERY DAY   metoprolol  tartrate (LOPRESSOR ) 25 MG tablet Take 0.5 tablets (12.5 mg total) by mouth every 6 (six) hours as needed (palpitations).   Omega-3 Fatty Acids (FISH OIL) 1000 MG CAPS Take 1,000 mg by mouth daily.   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   Social Drivers of Corporate investment banker Strain: Low Risk  (01/23/2023)   Overall Financial Resource Strain (CARDIA)    Difficulty of Paying Living Expenses: Not hard at all  Food Insecurity: No Food Insecurity (01/23/2023)    Hunger Vital Sign    Worried About Running Out of Food in the Last Year: Never true    Ran Out of Food in the Last Year: Never true  Transportation Needs: No Transportation Needs (01/23/2023)   PRAPARE - Administrator, Civil Service (Medical): No    Lack of Transportation (Non-Medical): No  Physical Activity: Sufficiently Active (01/23/2023)   Exercise Vital Sign    Days of Exercise per Week: 6 days    Minutes of Exercise per Session: 30 min  Stress: No Stress Concern Present (01/23/2023)   [name] of Occupational Health - Occupational Stress Questionnaire    Feeling of Stress : Not at all  Social Connections: Moderately Integrated (01/23/2023)   Social Connection and Isolation Panel [NHANES]    Frequency of Communication with Friends and Family: More than three times a week    Frequency of Social Gatherings with Friends and Family: Three times a week    Attends Religious Services: Never    Active Member of Clubs or Organizations: Yes    Attends Banker Meetings: More than 4 times per year    Marital Status: Married  Catering manager Violence: Not At Risk (01/23/2023)   Humiliation, Afraid, Rape, and Kick questionnaire    Fear of Current or Ex-Partner: No    Emotionally Abused: No    Physically Abused: No    Sexually Abused: No     Review of Systems: General: negative for chills, fever, night sweats or weight changes.  Cardiovascular: negative for chest pain, dyspnea on exertion, edema, orthopnea, palpitations, paroxysmal nocturnal dyspnea or shortness of breath Dermatological: negative for rash Respiratory: negative for cough or wheezing Urologic: negative for hematuria Abdominal: negative for nausea, vomiting, diarrhea, bright red blood per rectum, melena, or hematemesis Neurologic: negative for visual changes, syncope, or dizziness All other systems reviewed and are otherwise negative except as noted above.    Blood pressure 124/64, pulse 73,  height 6\' 1"  (1.854 m), weight 194 lb 9.6 oz (88.3 kg), SpO2 95%.  General appearance: alert and no distress Neck: no adenopathy, no carotid bruit, no JVD, supple, symmetrical, trachea midline, and thyroid  not enlarged, symmetric, no tenderness/mass/nodules Lungs: clear to auscultation bilaterally Heart: irregularly irregular rhythm and soft outflow tract murmur Extremities: extremities normal, atraumatic, no cyanosis or edema Pulses: 2+ and symmetric Skin: Skin color, texture, turgor normal. No rashes or lesions Neurologic: Grossly normal  EKG EKG Interpretation Date/Time:  Wednesday May 28 2023 14:54:52 EDT Ventricular Rate:  80 PR Interval:    QRS Duration:  100 QT Interval:  374 QTC Calculation: 431 R Axis:   -33  Text Interpretation: Atrial fibrillation Left axis deviation Minimal voltage criteria for LVH, may be normal variant ( R in aVL ) Cannot rule out Anteroseptal infarct (cited on or before 01-Oct-2022) When compared with ECG of 01-Oct-2022 11:21, No significant change was found Confirmed by Lauro Portal [phone]) on 05/28/2023 2:59:17 PM    ASSESSMENT AND PLAN:   Dyslipidemia History of dyslipidemia on statin therapy followed by his PCP.  Thoracic aortic aneurysm [facility]) History of a ascending thoracic aortic aneurysm measuring 47 mm by CTA and 50 mm by 2D echo in August and September respectively of last year.  This has remained stable.  It is followed on an annual basis.  Dr. Sherene Dilling is also following along.  Essential hypertension History of essential hypertension with blood pressure measured today at 124/64.  He is on low-dose lisinopril  and metoprolol .  Paroxysmal atrial fibrillation (HCC) History of persistent A-fib rate controlled on Eliquis  oral anticoagulation.  He has complained of episodes of dizziness.  I am going to get a 30-day event monitor to further evaluate.  He also complains of amaurosis although he is had this for many years and is already on oral  anticoagulation.     Avanell Leigh MD FACP,FACC,FAHA, [employer] 05/28/2023 3:11 PM

## 2023-05-28 NOTE — Patient Instructions (Signed)
 Medication Instructions:  Your physician recommends that you continue on your current medications as directed. Please refer to the Current Medication list given to you today.  *If you need a refill on your cardiac medications before your next appointment, please call your pharmacy*  Lab Work: Your physician recommends that you return for lab work in: the next week or 2 for FASTING Lipids  If you have labs (blood work) drawn today and your tests are completely normal, you will receive your results only by: MyChart Message (if you have MyChart) OR A paper copy in the mail If you have any lab test that is abnormal or we need to change your treatment, we will call you to review the results.  Testing/Procedures: Your physician has requested that you have an echocardiogram. Echocardiography is a painless test that uses sound waves to create images of your heart. It provides your doctor with information about the size and shape of your heart and how well your heart's chambers and valves are working. This procedure takes approximately one hour. There are no restrictions for this procedure. Please do NOT wear cologne, perfume, aftershave, or lotions (deodorant is allowed). Please arrive 15 minutes prior to your appointment time.  Please note: We ask at that you not bring children with you during ultrasound (echo/ vascular) testing. Due to room size and safety concerns, children are not allowed in the ultrasound rooms during exams. Our front office staff cannot provide observation of children in our lobby area while testing is being conducted. An adult accompanying a patient to their appointment will only be allowed in the ultrasound room at the discretion of the ultrasound technician under special circumstances. We apologize for any inconvenience.  [employer] Cardiac Event Monitor Instructions  Your physician has requested you wear your cardiac event monitor for 30 days. [employer] may call or text to  confirm a shipping address. The monitor will be sent to a land address via UPS. [employer] will not ship a monitor to a PO BOX. It typically takes 3-5 days to receive your monitor after it has been enrolled. [employer] will assist with USPS tracking if your package is delayed. The telephone number for [employer] is [phone]. Once you have received your monitor, please review the enclosed instructions. Instruction tutorials can also be viewed under help and settings on the enclosed cell phone. Your monitor has already been registered assigning a specific monitor serial # to you.  Billing and Self Pay Discount Information  [employer] has been provided the insurance information we had on file for you.  If your insurance has been updated, please call [employer] at [phone] to provide them with your updated insurance information.   [employer] offers a discounted Self Pay option for patients who have insurance that does not cover their cardiac event monitor or patients without insurance.  The discounted cost of a Self Pay Cardiac Event Monitor would be $225.00 , if the patient contacts [employer] at [phone] within 7 days of applying the monitor to make payment arrangements.  If the patient does not contact [employer] within 7 days of applying the monitor, the cost of the cardiac event monitor will be $350.00.  Applying the monitor  Remove cell phone from case and turn it on. The cell phone works as IT consultant and needs to be within [employer] of you at all times. The cell phone will need to be charged on a daily basis. We recommend you plug the cell phone into the enclosed charger at your bedside  table every night.  Monitor batteries: You will receive two monitor batteries labelled #1 and #2. These are your recorders. Plug battery #2 onto the second connection on the enclosed charger. Keep one battery on the charger at all times. This will keep the monitor battery deactivated. It  will also keep it fully charged for when you need to switch your monitor batteries. A small light will be blinking on the battery emblem when it is charging. The light on the battery emblem will remain on when the battery is fully charged.  Open package of a Monitor strip. Insert battery #1 into black hood on strip and gently squeeze monitor battery onto connection as indicated in instruction booklet. Set aside while preparing skin.  Choose location for your strip, vertical or horizontal, as indicated in the instruction booklet. Shave to remove all hair from location. There cannot be any lotions, oils, powders, or colognes on skin where monitor is to be applied. Wipe skin clean with enclosed Saline wipe. Dry skin completely.  Peel paper labeled #1 off the back of the Monitor strip exposing the adhesive. Place the monitor on the chest in the vertical or horizontal position shown in the instruction booklet. One arrow on the monitor strip must be pointing upward. Carefully remove paper labeled #2, attaching remainder of strip to your skin. Try not to create any folds or wrinkles in the strip as you apply it.  Firmly press and release the circle in the center of the monitor battery. You will hear a small beep. This is turning the monitor battery on. The heart emblem on the monitor battery will light up every 5 seconds if the monitor battery in turned on and connected to the patient securely. Do not push and hold the circle down as this turns the monitor battery off. The cell phone will locate the monitor battery. A screen will appear on the cell phone checking the connection of your monitor strip. This may read poor connection initially but change to good connection within the next minute. Once your monitor accepts the connection you will hear a series of 3 beeps followed by a climbing crescendo of beeps. A screen will appear on the cell phone showing the two monitor strip placement options. Touch  the picture that demonstrates where you applied the monitor strip.  Your monitor strip and battery are waterproof. You are able to shower, bathe, or swim with the monitor on. They just ask you do not submerge deeper than 3 feet underwater. We recommend removing the monitor if you are swimming in a lake, river, or ocean.  Your monitor battery will need to be switched to a fully charged monitor battery approximately once a week. The cell phone will alert you of an action which needs to be made.  On the cell phone, tap for details to reveal connection status, monitor battery status, and cell phone battery status. The green dots indicates your monitor is in good status. A red dot indicates there is something that needs your attention.  To record a symptom, click the circle on the monitor battery. In 30-60 seconds a list of symptoms will appear on the cell phone. Select your symptom and tap save. Your monitor will record a sustained or significant arrhythmia regardless of you clicking the button. Some patients do not feel the heart rhythm irregularities. [employer] will notify us  of any serious or critical events.  Refer to instruction booklet for instructions on switching batteries, changing strips, the Do not disturb or  Pause features, or any additional questions.  Call [employer] at [phone], to confirm your monitor is transmitting and record your baseline. They will answer any questions you may have regarding the monitor instructions at that time.  Returning the monitor to [employer]  Place all equipment back into blue box. Peel off strip of paper to expose adhesive and close box securely. There is a prepaid UPS shipping label on this box. Drop in a UPS drop box, or at a UPS facility like [employer]. You may also contact [employer] to arrange UPS to pick up monitor package at your home.   Follow-Up: At [facility], you and your health needs are our priority.  As part of  our continuing mission to provide you with exceptional heart care, our providers are all part of one team.  This team includes your primary Cardiologist (physician) and Advanced Practice Providers or APPs (Physician Assistants and Nurse Practitioners) who all work together to provide you with the care you need, when you need it.  Your next appointment:   3 month(s)  Provider:   Marcie Sever, PA-C, Callie Goodrich, PA-C, Kathleen Johnson, PA-C, Hao Meng, PA-C, Marlana Silvan, NP, or Katlyn West, NP        Then, Lauro Portal, MD will plan to see you again in 6 month(s).    We recommend signing up for the patient portal called "MyChart".  Sign up information is provided on this After Visit Summary.  MyChart is used to connect with patients for Virtual Visits (Telemedicine).  Patients are able to view lab/test results, encounter notes, upcoming appointments, etc.  Non-urgent messages can be sent to your provider as well.   To learn more about what you can do with MyChart, go to [email or web address].

## 2023-05-28 NOTE — Assessment & Plan Note (Signed)
 History of essential hypertension with blood pressure measured today at 124/64.  He is on low-dose lisinopril  and metoprolol .

## 2023-06-02 DIAGNOSIS — I480 Paroxysmal atrial fibrillation: Secondary | ICD-10-CM | POA: Diagnosis not present

## 2023-06-02 DIAGNOSIS — E785 Hyperlipidemia, unspecified: Secondary | ICD-10-CM | POA: Diagnosis not present

## 2023-06-03 ENCOUNTER — Ambulatory Visit: Payer: Self-pay | Admitting: Cardiovascular Disease

## 2023-06-03 LAB — COMPREHENSIVE METABOLIC PANEL WITH GFR
ALT: 30 IU/L (ref 0–44)
AST: 26 IU/L (ref 0–40)
Albumin: 4.1 g/dL (ref 3.8–4.8)
Alkaline Phosphatase: 91 IU/L (ref 44–121)
BUN/Creatinine Ratio: 21 (ref 10–24)
BUN: 16 mg/dL (ref 8–27)
Bilirubin Total: 0.8 mg/dL (ref 0.0–1.2)
CO2: 22 mmol/L (ref 20–29)
Calcium: 9.3 mg/dL (ref 8.6–10.2)
Chloride: 104 mmol/L (ref 96–106)
Creatinine, Ser: 0.77 mg/dL (ref 0.76–1.27)
Globulin, Total: 2.4 g/dL (ref 1.5–4.5)
Glucose: 84 mg/dL (ref 70–99)
Potassium: 5.3 mmol/L — ABNORMAL HIGH (ref 3.5–5.2)
Sodium: 141 mmol/L (ref 134–144)
Total Protein: 6.5 g/dL (ref 6.0–8.5)
eGFR: 92 mL/min/{1.73_m2} (ref >59)

## 2023-06-03 LAB — LIPID PANEL
Chol/HDL Ratio: 2.5 ratio (ref 0.0–5.0)
Cholesterol, Total: 121 mg/dL (ref 100–199)
HDL: 49 mg/dL (ref >39)
LDL Chol Calc (NIH): 61 mg/dL (ref 0–99)
Triglycerides: 47 mg/dL (ref 0–149)
VLDL Cholesterol Cal: 11 mg/dL (ref 5–40)

## 2023-06-03 LAB — CBC
Hematocrit: 44.9 % (ref 37.5–51.0)
Hemoglobin: 14.1 g/dL (ref 13.0–17.7)
MCH: 30.2 pg (ref 26.6–33.0)
MCHC: 31.4 g/dL — ABNORMAL LOW (ref 31.5–35.7)
MCV: 96 fL (ref 79–97)
Platelets: 169 10*3/uL (ref 150–450)
RBC: 4.67 x10E6/uL (ref 4.14–5.80)
RDW: 12.9 % (ref 11.6–15.4)
WBC: 4.1 10*3/uL (ref 3.4–10.8)

## 2023-06-04 ENCOUNTER — Telehealth: Payer: Self-pay | Admitting: Cardiovascular Disease

## 2023-06-04 DIAGNOSIS — R42 Dizziness and giddiness: Secondary | ICD-10-CM | POA: Diagnosis not present

## 2023-06-04 NOTE — Telephone Encounter (Signed)
 Jerry Velazquez calling to report urgent EKG results.

## 2023-06-04 NOTE — Telephone Encounter (Signed)
   Cardiac Monitor Alert  Date of alert:  06/04/2023   Patient Name: Jerry Velazquez  DOB: 1945-02-02  MRN: [id number]   [facility] Cardiologist: Lauro Portal, MD  [facility] EP:  None    Monitor Information: Cardiac Event Monitor [[employer]]  Reason:  New Onset A-flutter baseline Ordering provider:  Dr. Katheryne Pane   Alert Atrial Fibrillation/Flutter This is the 1st alert for this rhythm.  The patient has a hx of Atrial Fibrillation/Flutter.    Anticoagulation medication as of 06/04/2023           ELIQUIS  5 MG TABS tablet TAKE 1 TABLET BY MOUTH TWICE A DAY       Next Cardiology Appointment   Date:  09/01/2023  Provider:  Katlyn West NP  The patient was contacted today.  He is asymptomatic. Arrhythmia, symptoms and history reviewed with Katlyn West NP.  Plan:  Patient has been seen at Afib clinic, offered referral and patient refused. Continue to monitor symptoms until appointment date.    Jaiyla Granados, RN  06/04/2023 2:40 PM

## 2023-06-05 ENCOUNTER — Telehealth: Payer: Self-pay | Admitting: Cardiovascular Disease

## 2023-06-05 NOTE — Telephone Encounter (Signed)
 Calling to report urgent EKG notification. Call transferred

## 2023-06-05 NOTE — Telephone Encounter (Signed)
 Returned call to [facility] 5/22 at 528 am atrial flutter 60 bpm 3.2 second pause

## 2023-06-05 NOTE — Telephone Encounter (Signed)
   Cardiac Monitor Alert  Date of alert:  06/05/2023   Patient Name: Jerry Velazquez  DOB: [birth year]  MRN: [id number]   [facility] Cardiologist: Lauro Portal, MD  [facility] EP:  None    Monitor Information: Cardiac Event Monitor [[employer]]  Reason:  Dizziness Ordering provider:  Dr.Berry   Alert Atrial Fibrillation/Flutter This is the 2nd alert for this rhythm.  The patient has a hx of Atrial Fibrillation/Flutter.    Anticoagulation medication as of 06/05/2023           ELIQUIS  5 MG TABS tablet TAKE 1 TABLET BY MOUTH TWICE A DAY       Next Cardiology Appointment   Date:  09/01/2023  Provider:  Katelyn West  The patient was contacted today.  He is asymptomatic. Continue to monitor   Other: Spoke with Dr. Lindbergh Reusing, LPN  2/53/6644 0:34 PM

## 2023-06-05 NOTE — Telephone Encounter (Signed)
 Sura calling from [facility] with auto triggered event at 754 severe brady atrial flutter at 36 bpm

## 2023-06-05 NOTE — Telephone Encounter (Signed)
 Abn reading on heart monitor

## 2023-06-05 NOTE — Telephone Encounter (Signed)
   Cardiac Monitor Alert  Date of alert:  06/05/2023   Patient Name: Jerry Velazquez  DOB: 1945/01/17  MRN: [id number]   [facility] Cardiologist: Lauro Portal, MD  [facility] EP:  None    Monitor Information: Cardiac Event Monitor [[employer]]  Reason:  Dizziness Ordering provider:  Dr.Berry   Alert Atrial Fibrillation/Flutter This is the 3rd alert for this rhythm.  The patient has a hx of Atrial Fibrillation/Flutter.    Anticoagulation medication as of 06/05/2023           ELIQUIS  5 MG TABS tablet TAKE 1 TABLET BY MOUTH TWICE A DAY       Next Cardiology Appointment   Date:  09/01/2023  Provider:  Katelyn West  The patient was contacted today.  He is asymptomatic. Continue to monitor   Other: Spoke with Dr. Lindbergh Reusing, LPN  09/12/5619 3:08 PM

## 2023-06-06 NOTE — Telephone Encounter (Signed)
 Monitoring strips received from urgent report 06/05/2023 and reviewed by Dr [name], DOD. Continue monitoring as pt was asymptomatic. Report signed by Dr [name].

## 2023-06-07 ENCOUNTER — Telehealth: Payer: Self-pay | Admitting: Home Health

## 2023-06-07 NOTE — Telephone Encounter (Signed)
 I also reviewed his rhythm strip Friday and did not think it was important. A. Fib is known and he is on anticoagulation and 3 sec pause is insignificant. Needs to be > 5 Seconds

## 2023-06-07 NOTE — Telephone Encounter (Signed)
 I have extensively reviewed his chart, he does have mild hypokalemia, but he has been on lisinopril  for a long time and low potassium diet has been discussed as per charting notes hence we will renew the prescription.

## 2023-06-07 NOTE — Telephone Encounter (Signed)
 Jerry Velazquez representative paged after-hours line reporting abnormal telemetry report.  Auto triggered event at 3:35 AM today revealing atrial flutter with slowed ventricular rate at 31 bpm, tracing was 75-second long, patient was in a flutter with ventricular rate 31 to 59 bpm.  Next recording was at 4:30 AM with atrial flutter at 53 bpm.  Patient was not reached out.   Called patient today, voice mail left for [employer] screening system for the patient to call back.

## 2023-06-07 NOTE — Telephone Encounter (Signed)
 Called the patient's emergency contact Wife Ms Mcgibbon again this afternoon for follow up, as he did not call back. There is no one to answer the phone.

## 2023-06-08 NOTE — Telephone Encounter (Signed)
 Remote monitoring bradycardia alert triggered by aflutter with 2.7 second pause. The patient with several alerts for this rhythm. I did not call the patient as the pause was minimal.   Velvet Gibbs, MD MS  Overnight Cardiology Moonlighter

## 2023-06-10 ENCOUNTER — Telehealth: Payer: Self-pay

## 2023-06-10 NOTE — Telephone Encounter (Signed)
 Per Dr. Berry Bristol, a-fib is ok and pauses less than 5 seconds are not clinically significant. Will continue to monitor.

## 2023-06-10 NOTE — Telephone Encounter (Signed)
   Cardiac Monitor Alert  Date of alert:  06/10/2023   Patient Name: Jerry Velazquez  DOB: May 31, 1945  MRN: [id number]   [facility] Cardiologist: Lauro Portal, MD  [facility] EP:  None    Monitor Information: Cardiac Event Monitor [[employer]]  Reason:  History of persistent A. FIB rate controlled  Ordering provider:  Dr. Katheryne Pane   Alert Atrial Fibrillation/Flutter This is the 4th alert for this rhythm.  The patient has a hx of Atrial Fibrillation/Flutter. -06/07/23 3:34 am EDT atrial flutter with multiple pauses noted, longest Pause is 2.6 seconds -06/08/23 12:31 am EDT atrial flutter with 2.7 second pause -06/10/23 7:56 am EDT atrial flutter with multiple pauses noted, longest pause is 2.6 seconds  Confirm that the anticoagulant listed below is correct. If not update the chart   :1}  Anticoagulation medication as of 06/10/2023           ELIQUIS  5 MG TABS tablet TAKE 1 TABLET BY MOUTH TWICE A DAY       Next Cardiology Appointment   Date:  09/01/23  Provider:  Katlyn West, NP  The patient was contacted today.  He is asymptomatic. Arrhythmia, symptoms and history reviewed with Dr. Micael Adas (DOD).  Plan:  Refer to ordering provider.   Other: Will have sign monitor scanned into chart for Dr. Katheryne Pane to review  Antonetta Kitchen, RN  06/10/2023 8:30 AM

## 2023-06-10 NOTE — Telephone Encounter (Signed)
 Jerry Velazquez representative called to report a trigger for yesterday at 3:49 AM, patient's heart beat was 30 beats/min with a 3.0 sec pause and stated patient was in afib. Followed by another trigger for this morning at 7:57 AM in afib with heartbeat of 34 beats/min with a 2.6 sec pause with caused for bradycardia to be noted. Please advise.

## 2023-06-11 ENCOUNTER — Telehealth: Payer: Self-pay | Admitting: Cardiology

## 2023-06-11 NOTE — Telephone Encounter (Signed)
   Alerted about urgent EKG from [facility] monitoring system on this patient. They reported A. Flutter with HR 34 bpm at 6:01 AM on 5/28 with brief pauses. Strips were faxed in and personally reviewed. It showed atrial flutter with brief pauses, the longest lasting 2.1 seconds. Attempted to call both the patient and his wife, there was no answer at either number. Upon chart review, it appears that this patient has previously experienced this as it is noted multiple times within the chart. No recommendations at this time.  Jiles Mote, PA-C 06/11/2023 8:00 AM

## 2023-06-11 NOTE — Telephone Encounter (Signed)
 No indication for changing course, monitor placed for heart block and not A. Fib. Okay to continue conservative observation for nwo

## 2023-06-13 ENCOUNTER — Telehealth: Payer: Self-pay | Admitting: Cardiology

## 2023-06-13 ENCOUNTER — Telehealth: Payer: Self-pay | Admitting: Cardiovascular Disease

## 2023-06-13 ENCOUNTER — Encounter: Payer: Self-pay | Admitting: Cardiovascular Disease

## 2023-06-13 NOTE — Telephone Encounter (Signed)
   Cardiac Monitor Alert  Date of alert:  06/13/2023   Patient Name: Jerry Velazquez  DOB: [birth year]  MRN: [id number]   [facility] Cardiologist: Lauro Portal, MD  [facility] EP:  None    Monitor Information: Cardiac Event Monitor [Preventice]  Reason:  Dizziness and Giddiness Ordering provider:  Katheryne Pane   Alert Atrial Flutter 30bpm with 3 second pause at 754am This is the 4th alert for this rhythm.  The patient has a hx of Atrial Fibrillation/Flutter.    Anticoagulation medication as of 06/13/2023           ELIQUIS  5 MG TABS tablet TAKE 1 TABLET BY MOUTH TWICE A DAY       Next Cardiology Appointment   Date:  09/01/2023  Provider:  Katlyn West, NP  The patient was contacted today.  He is asymptomatic.  Pt reports he was most likely asleep at the time of event. Reviewed with Dr Meldon Sport and Continue Monitoring    Arva Lathe, RN  06/13/2023 10:25 AM

## 2023-06-13 NOTE — Telephone Encounter (Signed)
 Jerry Velazquez from [facility] is calling with critical EKG results. Call transferred.

## 2023-06-13 NOTE — Telephone Encounter (Signed)
 Paged about critical EKG result from [facility]. At 3:39am, patient had a 3 second pause while in atrial flutter with HR 38bpm. Patient already on apixaban . Suspect sleeping at 3:39am so less concerned about the 3 second pause. Chart review shows he appears to have had this multiple times without symptoms. Will CC his primary cardiologist Dr. Katheryne Pane as an FYI in case he needs any adjustments to his metoprolol .  Aubrey Leaf, MD Cardiology 06/13/2023

## 2023-06-13 NOTE — Telephone Encounter (Signed)
 No changes needed, asymptomatic pauses at night.  Continue monitoring.

## 2023-06-16 ENCOUNTER — Telehealth: Payer: Self-pay

## 2023-06-16 ENCOUNTER — Telehealth: Payer: Self-pay | Admitting: Cardiovascular Disease

## 2023-06-16 NOTE — Telephone Encounter (Signed)
   Cardiac Monitor Alert  Date of alert:  06/16/2023   Patient Name: Jerry Velazquez  DOB: [birth year]  MRN: [id number]   [facility] Cardiologist: Lauro Portal, MD  [facility] EP:  None    Monitor Information: Cardiac Event Monitor [Preventice]  Reason:  Dizziness, Hx of paroxysmal a-fib Ordering provider:  Dr. Katheryne Pane  Alert Atrial Fibrillation/Flutter with pauses 3.6 sec, 3.0 sec, 3.4 sec This is the 10th alert for this rhythm.  The patient has a hx of Atrial Fibrillation/Flutter.    Anticoagulation medication as of 06/16/2023           ELIQUIS  5 MG TABS tablet TAKE 1 TABLET BY MOUTH TWICE A DAY       Next Cardiology Appointment   Date:  8/18  Provider:  Katlyn West, NP  The patient could NOT be reached by telephone today.  Multiple events happening when pt is asleep.  We will continue to monitor.    Rashelle Ireland L, RN  06/16/2023 8:57 AM

## 2023-06-16 NOTE — Telephone Encounter (Signed)
 Caller (May) is reporting out of range results.

## 2023-06-16 NOTE — Telephone Encounter (Signed)
    Cardiac Monitor Alert   Date of alert:  06/16/2023    Patient Name: Jerry Velazquez  DOB: December 15, 1945  MRN: [id number]    [facility] Cardiologist: Lauro Portal, MD  [facility] EP:  None     Monitor Information: Cardiac Event Monitor [Preventice]  Reason:  Dizziness, Hx of paroxysmal a-fib Ordering provider:  Dr. Katheryne Pane   Alert Atrial Fibrillation/Flutter with pause 3.1 sec.  This is the 15th alert for this rhythm.  The patient has a hx of Atrial Fibrillation/Flutter.    Anticoagulation medication as of 06/16/2023              ELIQUIS  5 MG TABS tablet TAKE 1 TABLET BY MOUTH TWICE A DAY           Next Cardiology Appointment   Date:  8/18  Provider:  Katlyn West, NP   The patient could NOT be reached by telephone today.  Multiple events happening when pt is asleep.  We will continue to monitor.      Ovetta Bazzano L, RN  06/16/2023 10:44 AM

## 2023-06-17 ENCOUNTER — Telehealth: Payer: Self-pay | Admitting: Cardiovascular Disease

## 2023-06-17 DIAGNOSIS — R001 Bradycardia, unspecified: Secondary | ICD-10-CM

## 2023-06-17 DIAGNOSIS — R42 Dizziness and giddiness: Secondary | ICD-10-CM

## 2023-06-17 NOTE — Telephone Encounter (Signed)
 Transferred from call center-   [name] from [facility]- Abnormal EKG-Dr. Maeola Schmidt-   [phone]- severe brady HR 28- lasted 20 seconds ( A flutter underlying rhythm) auto triggered no symptoms.   Report to be faxed over via onbase.     ______________________________________  Jerry Velazquez pt- he states he was sleeping at the time of the event and no symptoms. Feels fine today

## 2023-06-17 NOTE — Telephone Encounter (Signed)
 Follow Up:      He an abnormal EKG results that he needs to report.is

## 2023-06-18 ENCOUNTER — Telehealth: Payer: Self-pay | Admitting: Cardiovascular Disease

## 2023-06-18 NOTE — Telephone Encounter (Signed)
 Jerry Velazquez is calling with abnormal notification on heart monitor. Transferred to triage.

## 2023-06-18 NOTE — Telephone Encounter (Signed)
 Received call from [place] with [facility] Cardiac Monitoring. Waiting for report to come in.     Cardiac Monitor Alert  Date of alert:  06/18/2023   Patient Name: Jerry Velazquez  DOB: Sep 28, 1945  MRN: [id number]   [facility] Cardiologist: Lauro Portal, MD  [facility] EP:  None    Monitor Information: Cardiac Event Monitor [Preventice]  Reason:  Dizziness, Hx of PAF Ordering provider:  Dr. Katheryne Pane   Alert Atrial Fibrillation/Flutter  with  a 3.4 second pause. This is the 17 alert for this rhythm.  The patient has a hx of Atrial Fibrillation/Flutter.    Anticoagulation medication as of 06/18/2023           ELIQUIS  5 MG TABS tablet TAKE 1 TABLET BY MOUTH TWICE A DAY       Next Cardiology Appointment   Date:  09/01/23  Provider:  Katlyn West NP  The patient was contacted today.  He is asymptomatic. Send to Dr. Katheryne Pane to review    Antonetta Kitchen, RN  06/18/2023 3:43 PM

## 2023-06-19 ENCOUNTER — Telehealth: Payer: Self-pay | Admitting: Cardiovascular Disease

## 2023-06-19 NOTE — Telephone Encounter (Signed)
 [facility] monitoring calling with  results

## 2023-06-19 NOTE — Telephone Encounter (Signed)
   Cardiac Monitor Alert  Date of alert:  06/19/2023   Patient Name: Jerry Velazquez  DOB: 05/19/1945  MRN: [id number]   [facility] Cardiologist: Lauro Portal, MD  [facility] EP:  None    Monitor Information: Cardiac Event Monitor [Pre[employer]ice]  Reason:  AF with pauses - longest lasting 3.2 seconds Ordering provider:  Dr Katheryne Pane :1}  Alert Atrial Fibrillation/Flutter with pauses - longest lasting 3.2 seconds This is the 13 alert for this rhythm.  The patient has a hx of Atrial Fibrillation/Flutter.  {  Anticoagulation medication as of 06/19/2023           ELIQUIS  5 MG TABS tablet TAKE 1 TABLET BY MOUTH TWICE A DAY       Next Cardiology Appointment   Date:  09/01/23  Provider:  K. Stephenie Einstein, NP  The patient was contacted today.  He is asymptomatic. Was asleep during auto trigger which occurred at 5:30 am and states no symptoms. Confirmed he is still taking Eliquis  as prescribed. Will forward to Dr Katheryne Pane    Dareen Ebbing, RN  06/19/2023 10:36 AM

## 2023-06-23 ENCOUNTER — Telehealth: Payer: Self-pay | Admitting: Cardiovascular Disease

## 2023-06-23 NOTE — Telephone Encounter (Signed)
 Shelton Dibbles calling with urgent EKG notification

## 2023-06-23 NOTE — Telephone Encounter (Signed)
EP referral placed.  

## 2023-06-23 NOTE — Telephone Encounter (Signed)
 Not in triage- routing back

## 2023-06-23 NOTE — Telephone Encounter (Signed)
 Transferred as stat call, jackie from [facility] [employer].   Auto trigger 6/8 0548am, Atrial flutter with multiple pauses, longest 3.6secs at 30bpm during 75 seconds.   Called patient to check on patient- he states he was asleep and it did not wake him up and he feels great. This is about the 4th or 5th time this has occurred. Will route to provider as Arlie Lain, of note patient does have appointment this week.

## 2023-06-24 ENCOUNTER — Telehealth: Payer: Self-pay | Admitting: Cardiovascular Disease

## 2023-06-24 NOTE — Telephone Encounter (Signed)
Calling with urgent EKG notification.

## 2023-06-24 NOTE — Telephone Encounter (Signed)
 Transferred from call center-   Abnormal EKG- bradycardia auto trigger 6/10 0626am, atrial flutter avg hr of 39bpm, 75 seconds, report posted.   Called pt just to check on event- pt was asleep at time of the event and feels totally fine.

## 2023-06-25 ENCOUNTER — Telehealth: Payer: Self-pay

## 2023-06-25 ENCOUNTER — Telehealth: Payer: Self-pay | Admitting: Cardiovascular Disease

## 2023-06-25 NOTE — Progress Notes (Signed)
 Electrophysiology Office Note:   Date:  06/28/2023  ID:  Jerry Velazquez, Jerry Velazquez Oct 22, 1945, MRN [id number]  Primary Cardiologist: Lauro Portal, MD Electrophysiologist: Jerry Kohler, MD      History of Present Illness:   Jerry Velazquez is a 78 y.o. male with h/o bicuspid aortic valve, ascending thoracic aortic aneurysm, hypertension, persistent atrial fibrillation who is being seen today for evaluation at the request of Dr. Katheryne Pane.  Discussed the use of AI scribe software for clinical note transcription with the patient, who gave verbal consent to proceed.  History of Present Illness He has a history of atrial fibrillation, identified during a routine visit about six months ago, with EKGs confirming its presence since at least September 2023. No palpitations or chest pain are reported. He experiences episodes of lightheadedness and temporary blindness, rare in occurrence, with the most recent episode about a week ago while driving. These episodes last two to three minutes and occur during low activity periods. He mentions these episodes have been part of his life since high school, though they had not occurred for many years until recently.  A live cardiac monitor was placed because of these symptoms.  He has only been notified for pauses that have always occurred during sleep. He has not been screened for sleep apnea, although his wife suspects he snores.  He is currently on Eliquis , started in 2018, and has been compliant with the medication regimen. No history of strokes or major complications related to atrial fibrillation.   Review of systems complete and found to be negative unless listed in HPI.   EP Information / Studies Reviewed:    EKG is ordered today. Personal review as below.  EKG Interpretation Date/Time:  Thursday June 26 2023 08:35:51 EDT Ventricular Rate:  72 PR Interval:    QRS Duration:  100 QT Interval:  390 QTC Calculation: 427 R Axis:   -37  Text  Interpretation: Atrial fibrillation Left axis deviation Incomplete right bundle branch block Moderate voltage criteria for LVH, may be normal variant ( R in aVL , Cornell product ) When compared with ECG of 28-May-2023 14:54, Minimal criteria for Anteroseptal infarct are no longer Present Confirmed by Jerry Velazquez [phone]) on 06/26/2023 9:10:54 AM   EKG 05/28/23: AF   Echo 09/12/22:  1. Left ventricular ejection fraction, by estimation, is 60 to 65%. The  left ventricle has normal function. The left ventricle has no regional  wall motion abnormalities. There is mild left ventricular hypertrophy of  the basal-septal segment. Left  ventricular diastolic parameters are indeterminate.   2. Right ventricular systolic function is normal. The right ventricular  size is normal.   3. The mitral valve is normal in structure. Mild mitral valve  regurgitation. No evidence of mitral stenosis.   4. The aortic valve is bicuspid. Aortic valve regurgitation is mild.  Moderate aortic valve stenosis.   5. Aortic dilatation noted. There is moderate dilatation of the aortic  root, measuring 49 mm. There is severe dilatation of the ascending aorta,  measuring 50 mm.   Risk Assessment/Calculations:    CHA2DS2-VASc Score = 4   This indicates a 4.8% annual risk of stroke. The patient's score is based upon: CHF History: 0 HTN History: 1 Diabetes History: 0 Stroke History: 0 Vascular Disease History: 1 Age Score: 2 Gender Score: 0     STOP-Bang Score:  5       Physical Exam:   VS:  BP 110/60   Pulse 72   Wt  199 lb 6.4 oz (90.4 kg)   SpO2 98%   BMI 26.31 kg/m    Wt Readings from Last 3 Encounters:  06/26/23 199 lb 6.4 oz (90.4 kg)  05/28/23 194 lb 9.6 oz (88.3 kg)  10/09/22 199 lb (90.3 kg)     GEN: Well nourished, well developed in no acute distress NECK: No JVD CARDIAC: Normal rate, irregular RESPIRATORY:  Clear to auscultation without rales, wheezing or rhonchi  ABDOMEN: Soft,  non-distended EXTREMITIES:  1+ edema; No deformity   ASSESSMENT AND PLAN:    #. Dizziness and Visual Changes: Patient is currently wearing a cardiac monitor.  The only time he has been contacted for abnormal findings has been with pauses that have always occurred during sleep.  He has continued to have intermittent episodes of dizziness/lightheadedness and visual changes while wearing the monitor, but has not been contacted for any abnormal findings during these episodes.  The results of the monitor are not completely available for review at this time. - We will have the patient return in 4 weeks with me to review results of the monitor to see if there are any correlations of heart rhythm abnormalities during his episodes of dizziness and visual changes.  #. Longstanding persistent atrial fibrillation: Appears to be asymptomatic. Has not been in sinus for >1 year.  Normal heart rates during daytime and with activity. #. Nocturnal bradycardia: Heart rates appears to be appropriate during the day without symptoms.  -No current indication for pacing.  -Screen for sleep apnea.   #. Secondary hypercoagulable state due to atrial fibrillation: - Continue Eliquis  5mg  BID.   #. Hypertension -At goal today.  Recommend checking blood pressures 1-2 times per week at home and recording the values.  Recommend bringing these recordings to the primary care physician.  Follow up with Dr. [name] in 4 weeks  Signed, Jerry Kohler, MD

## 2023-06-25 NOTE — Telephone Encounter (Signed)
   Cardiac Monitor Alert  Date of alert:  06/25/2023   Patient Name: Jerry Velazquez  DOB: 1945/02/10  MRN: [id number]   [facility] Cardiologist: Lauro Portal, MD  [facility] EP:  None    Monitor Information: Cardiac Event Monitor [Pre[employer]ice]  Reason:  dizziness Ordering provider:  Katheryne Pane   Alert Atrial Fibrillation/Flutter This is the 8 alert for this rhythm.  The patient has a hx of Atrial Fibrillation/Flutter.    Anticoagulation medication as of 06/25/2023           ELIQUIS  5 MG TABS tablet TAKE 1 TABLET BY MOUTH TWICE A DAY       Next Cardiology Appointment   Date:  06/26/2023  Provider:  [name]  The patient was contacted today.  He is asymptomatic. Arrhythmia, symptoms and history reviewed with Katheryne Pane.  Plan:  Keep appointment with EP   Other:   Carrie Usery Wayman Hai, LPN  7/82/9562 1:30 AM

## 2023-06-25 NOTE — Telephone Encounter (Signed)
   Cardiac Monitor Alert  Date of alert:  06/25/2023   Patient Name: Jerry Velazquez  DOB: September 21, 1945  MRN: [id number]   [facility] Cardiologist: Lauro Portal, MD  [facility] EP:  None    Monitor Information: Cardiac Event Monitor [Preventice]  Reason:  AFLutter with 3.1 sec pause at 6:49 am Ordering provider:  Katheryne Pane   Alert Atrial Fibrillation/Flutter This is the 20th alert for this rhythm.  The patient has hx of Atrial Fibrillation/Flutter.     Next Cardiology Appointment   Date:  06/26/2023  Provider:  Dr. [name]  The patient was contacted today.  He is asymptomatic. And scheduled for referral to EP tomorrow    Alvenia Aus, RN  06/25/2023 11:29 AM

## 2023-06-25 NOTE — Telephone Encounter (Signed)
 [facility] calling to report urgent EKG results.

## 2023-06-26 ENCOUNTER — Encounter: Payer: Self-pay | Admitting: Student in an Organized Health Care Education/Training Program

## 2023-06-26 ENCOUNTER — Ambulatory Visit: Attending: Cardiology | Admitting: Cardiology

## 2023-06-26 ENCOUNTER — Telehealth: Payer: Self-pay | Admitting: Radiology

## 2023-06-26 ENCOUNTER — Encounter: Payer: Self-pay | Admitting: Cardiology

## 2023-06-26 ENCOUNTER — Telehealth: Payer: Self-pay | Admitting: Cardiovascular Disease

## 2023-06-26 VITALS — BP 110/60 | HR 72 | Wt 199.4 lb

## 2023-06-26 DIAGNOSIS — I4811 Longstanding persistent atrial fibrillation: Secondary | ICD-10-CM

## 2023-06-26 DIAGNOSIS — I10 Essential (primary) hypertension: Secondary | ICD-10-CM | POA: Diagnosis not present

## 2023-06-26 DIAGNOSIS — R001 Bradycardia, unspecified: Secondary | ICD-10-CM

## 2023-06-26 DIAGNOSIS — D6869 Other thrombophilia: Secondary | ICD-10-CM

## 2023-06-26 NOTE — Patient Instructions (Signed)
 Medication Instructions:  Your physician recommends that you continue on your current medications as directed. Please refer to the Current Medication list given to you today.  *If you need a refill on your cardiac medications before your next appointment, please call your pharmacy*  Testing/Procedures: [facility]amar Sleep Study  Your physician has recommended that you have a sleep study. This test records several body functions during sleep, including: brain activity, eye movement, oxygen and carbon dioxide blood levels, heart rate and rhythm, breathing rate and rhythm, the flow of air through your mouth and nose, snoring, body muscle movements, and chest and belly movement.  Follow-Up: At [facility], you and your health needs are our priority.  As part of our continuing mission to provide you with exceptional heart care, our providers are all part of one team.  This team includes your primary Cardiologist (physician) and Advanced Practice Providers or APPs (Physician Assistants and Nurse Practitioners) who all work together to provide you with the care you need, when you need it.  Your next appointment:   4 weeks  Provider:   Ardeen Kohler, MD

## 2023-06-26 NOTE — Progress Notes (Signed)
 I was paged by [name] regarding an ECG result for [name].  He had an episode of atrial flutter and 2 2-3 second nocturnal pauses.  These are both known conditions that the patient has.

## 2023-06-26 NOTE — Telephone Encounter (Signed)
 Patient agreement reviewed and signed on 06/26/2023.  WatchPAT issued to patient on 06/26/2023 by Alec Huntington, CMA. Patient aware to not open the WatchPAT box until contacted with the activation PIN. Patient profile initialized in CloudPAT on 06/26/2023 by Jenise Mixer. Device serial number: [id number]  Please list Reason for Call as Advice Only and type WatchPAT issued to patient in the comment box.

## 2023-06-26 NOTE — Telephone Encounter (Signed)
 Jerry Velazquez with [name] calling in to report critical monitor results

## 2023-06-26 NOTE — Telephone Encounter (Signed)
 Spoke with [facility], they reports the patient had a 3 sec pause at 3:30 am. 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 Patient was seen by dr [name]. Will forward this to him.

## 2023-06-27 ENCOUNTER — Telehealth: Payer: Self-pay

## 2023-06-27 NOTE — Telephone Encounter (Signed)
   Cardiac Monitor Alert  Date of alert:  06/27/2023   Patient Name: Jerry Velazquez  DOB: [birth year]  MRN: [id number]   [facility] Cardiologist: Lauro Portal, MD  [facility] EP:  Ardeen Kohler, MD    Monitor Information: Cardiac Event Monitor [Pre[employer]ice]  Reason:  A-flutter with multiple pauses noted, longest pause is 3.1 seconds Ordering provider:  Lauro Portal, MD   Alert Atrial Fibrillation/Flutter This is the 21st alert for this rhythm.  The patient has a hx of Atrial Fibrillation/Flutter.    Anticoagulation medication as of 06/27/2023           ELIQUIS  5 MG TABS tablet TAKE 1 TABLET BY MOUTH TWICE A DAY       Next Cardiology Appointment   Date:  7/10  Provider:  Dr. [name]  The patient was contacted today.  He is asymptomatic. Arrhythmia, symptoms and history reviewed with Dr. Stann Earnest.  Plan:  No changes - pt is on eliquis .    Syanne Looney M Pluma Diniz, LPN  09/12/5619 3:08 AM

## 2023-06-27 NOTE — Telephone Encounter (Signed)
 Caller Buelah Carmel) is reporting urgent readings.

## 2023-06-27 NOTE — Telephone Encounter (Signed)
   Cardiac Monitor Alert  Date of alert:  06/27/2023   Patient Name: Jerry Velazquez  DOB: [birth year]  MRN: [id number]   [facility] Cardiologist: Lauro Portal, MD  [facility] EP:  Ardeen Kohler, MD    Monitor Information: Cardiac Event Monitor [Pre[employer]ice]  Reason:    Dizziness, Hx of paroxysmal a-fib  Ordering provider:  Dr. Lauro Portal   Alert Atrial Fibrillation/Flutter with a pause for 2.4 seconds. This is the 22th alert for this rhythm.  The patient has a hx of Atrial Fibrillation/Flutter.    Anticoagulation medication as of 06/27/2023           ELIQUIS  5 MG TABS tablet TAKE 1 TABLET BY MOUTH TWICE A DAY       Next Cardiology Appointment   Date:  7/10  Provider:  Dr. [name]   The patient could NOT be reached by telephone today.  Pt already called today, pt is asymptomatic with these events. No changes at this time.   Other: Strips are uploaded to pt's chart under media.  Echo Propp L, RN  06/27/2023 10:03 AM

## 2023-06-28 ENCOUNTER — Telehealth: Payer: Self-pay | Admitting: Physician Assistant

## 2023-06-28 NOTE — Telephone Encounter (Signed)
   Cardiac Monitor Alert  Date of alert:  06/28/2023   Patient Name: Jerry Velazquez  DOB: 1945/07/14  MRN: [id number]   [facility] Cardiologist: Lauro Portal, MD  [facility] EP:  Ardeen Kohler, MD    Monitor Information: Cardiac Event Monitor [Pre[employer]ice]  Reason:  dizziness  Ordering provider:  Dr. Katheryne Pane   Alert Pause(s) - Longest:  2.6 seconds No symptoms reported. This is c/w many other alerts received.  This is the 23rd alert for this rhythm.   Next Cardiology Appointment   Date:  7/10  Provider:  Dr. [name]  Pt has seen EP (Dr. [name]) and has follow up planned.   Other:   Patrice Moates, PA-C  06/28/2023 10:28 AM

## 2023-07-01 ENCOUNTER — Telehealth: Payer: Self-pay | Admitting: Cardiology

## 2023-07-01 NOTE — Telephone Encounter (Signed)
   Cardiac Monitor Alert  Date of alert:  07/01/2023   Patient Name: Jerry Velazquez  DOB: 1945/05/18  MRN: [id number]   [facility] Cardiologist: Lauro Portal, MD  [facility] EP:  Ardeen Kohler, MD    Monitor Information: Cardiac Event Monitor [[employer]]  Reason:  dizziness Ordering provider:  Dr. Katheryne Pane  Alert Pause(s) - Longest:  3.2 seconds this morning at 0239. No symptoms reported, auto-triggered and consistent with other alerts previously received This is the 24 alert for this rhythm.   Next Cardiology Appointment  Date:  7/10  Provider:  Dr. [name]  Seen by EP in the office recently with outpatient follow up planned.    Johnie Nailer, NP  07/01/2023 6:40 AM

## 2023-07-02 ENCOUNTER — Telehealth: Payer: Self-pay | Admitting: Cardiovascular Disease

## 2023-07-02 NOTE — Telephone Encounter (Signed)
   Cardiac Monitor Alert  Date of alert:  07/02/2023   Patient Name: Jerry Velazquez  DOB: [birth year]  MRN: [id number]   [facility] Cardiologist: Lauro Portal, MD  [facility] EP:  Ardeen Kohler, MD    Monitor Information: Cardiac Event Monitor [[employer]]  Reason:  Dizziness Ordering provider:  Dr. Katheryne Pane :1}  Alert Atrial Flutter, 2.5 second pause, HR 38 bpm This is the 25th alert for this rhythm.  The patient has a hx of Atrial Fibrillation/Flutter.  { Anticoagulation medication as of 07/02/2023           ELIQUIS  5 MG TABS tablet TAKE 1 TABLET BY MOUTH TWICE A DAY       Next Cardiology Appointment   Date:  07/24/2023  Provider:  Dr. [name]  The patient was contacted today.  He is asymptomatic. Arrhythmia, symptoms and history reviewed with DOD 2--Dr. Lawana Pray.  Plan:  Continue to monitor.    Luwana Salvo, RN  07/02/2023 9:53 AM

## 2023-07-02 NOTE — Telephone Encounter (Signed)
 Call either dropped during transfer or they hung up.  Will await a return call.

## 2023-07-02 NOTE — Telephone Encounter (Signed)
 Calling with abn reading

## 2023-07-02 NOTE — Telephone Encounter (Signed)
 Alert triggered on 07/02/2023 at 1:30 AM.

## 2023-07-02 NOTE — Telephone Encounter (Signed)
 Urgent EKG notification

## 2023-07-07 ENCOUNTER — Encounter: Payer: Self-pay | Admitting: Cardiovascular Disease

## 2023-07-08 ENCOUNTER — Ambulatory Visit (HOSPITAL_COMMUNITY)
Admission: RE | Admit: 2023-07-08 | Discharge: 2023-07-08 | Disposition: A | Discharge status: Home / Self Care | Source: Ambulatory Visit | Attending: Internal Medicine | Admitting: Internal Medicine

## 2023-07-08 ENCOUNTER — Telehealth: Payer: Self-pay

## 2023-07-08 ENCOUNTER — Ambulatory Visit (INDEPENDENT_AMBULATORY_CARE_PROVIDER_SITE_OTHER)

## 2023-07-08 ENCOUNTER — Encounter: Payer: Self-pay | Admitting: Cardiology

## 2023-07-08 DIAGNOSIS — R42 Dizziness and giddiness: Secondary | ICD-10-CM

## 2023-07-08 DIAGNOSIS — I351 Nonrheumatic aortic (valve) insufficiency: Secondary | ICD-10-CM

## 2023-07-08 LAB — ECHOCARDIOGRAM COMPLETE
AR max vel: 0.68 cm2
AV Area VTI: 0.74 cm2
AV Area mean vel: 0.71 cm2
AV Mean grad: 28.4 mmHg
AV Peak grad: 50.1 mmHg
Ao pk vel: 3.54 m/s
Area-P 1/2: 3.73 cm2
P 1/2 time: 485 ms
S' Lateral: 4.3 cm

## 2023-07-08 NOTE — Telephone Encounter (Signed)
 Event monitor final report is complete.  Closing this encounter.

## 2023-07-08 NOTE — Telephone Encounter (Addendum)
 Notified patient the sleep team is working on the Prior Authorization for Home Sleep Test and will reach out to him in a few days with his PIN.

## 2023-07-09 ENCOUNTER — Other Ambulatory Visit: Payer: Self-pay | Admitting: Cardiovascular Disease

## 2023-07-09 DIAGNOSIS — I480 Paroxysmal atrial fibrillation: Secondary | ICD-10-CM

## 2023-07-09 NOTE — Telephone Encounter (Signed)
 Prescription refill request for Eliquis  received. Indication:afib Last office visit:6/25 Scr:0.77  5/25 Age: 78 Weight:90.4  kg  Prescription refilled

## 2023-07-10 ENCOUNTER — Telehealth: Payer: Self-pay

## 2023-07-10 ENCOUNTER — Encounter (INDEPENDENT_AMBULATORY_CARE_PROVIDER_SITE_OTHER): Payer: Self-pay | Admitting: Cardiology

## 2023-07-10 DIAGNOSIS — G4733 Obstructive sleep apnea (adult) (pediatric): Secondary | ICD-10-CM

## 2023-07-10 NOTE — Telephone Encounter (Signed)
**Note De-Identified Creighton Longley Obfuscation** Ordering provider: Dr Kennyth Associated diagnoses: A-Fib-I48.0 and Snoring-R06.83  WatchPAT PA obtained on 07/10/2023 by Keiasha Diep, Avelina HERO, LPN. Authorization: Per the [facility] Provider Portal this [facility]-HST PA has been approved from 07/08/2023 - 10/06/2023. Outpatient Authorization #: [id number]  Patient notified of PIN ([id number]) on 07/10/2023 Alannie Amodio Notification Method: phone.  Phone note routed to covering staff for follow-up.

## 2023-07-14 ENCOUNTER — Ambulatory Visit: Admitting: Cardiovascular Disease

## 2023-07-21 ENCOUNTER — Ambulatory Visit: Attending: Cardiology

## 2023-07-21 ENCOUNTER — Telehealth: Payer: Self-pay

## 2023-07-21 DIAGNOSIS — G4733 Obstructive sleep apnea (adult) (pediatric): Secondary | ICD-10-CM

## 2023-07-21 DIAGNOSIS — I351 Nonrheumatic aortic (valve) insufficiency: Secondary | ICD-10-CM

## 2023-07-21 DIAGNOSIS — I4811 Longstanding persistent atrial fibrillation: Secondary | ICD-10-CM

## 2023-07-21 DIAGNOSIS — R001 Bradycardia, unspecified: Secondary | ICD-10-CM

## 2023-07-21 DIAGNOSIS — I10 Essential (primary) hypertension: Secondary | ICD-10-CM

## 2023-07-21 DIAGNOSIS — I471 Supraventricular tachycardia, unspecified: Secondary | ICD-10-CM

## 2023-07-21 DIAGNOSIS — R002 Palpitations: Secondary | ICD-10-CM

## 2023-07-21 NOTE — Telephone Encounter (Signed)
-----   Message from Wilbert Bihari sent at 07/21/2023  1:36 PM EDT ----- Please let patient know that they have sleep apnea.  Recommend therapeutic CPAP titration for treatment of patient's sleep disordered breathing.

## 2023-07-21 NOTE — Procedures (Signed)
 SLEEP STUDY REPORT Patient Information Study Date: 07/10/2023 Patient Name: Jerry Velazquez Patient ID: [id number] Birth Date: 07-05-1945 Age: 78 Gender: Male BMI: 26.3 (W=198 lb, H=6' 1'') Stopbang: 5 Referring Physician: [name], MD  TEST DESCRIPTION: Home sleep apnea testing was completed using the WatchPat, a Type 1 device, utilizing  peripheral arterial tonometry (PAT), chest movement, actigraphy, pulse oximetry, pulse rate, body position and snore.  AHI was calculated with apnea and hypopnea using valid sleep time as the denominator. RDI includes apneas,  hypopneas, and RERAs. The data acquired and the scoring of sleep and all associated events were performed in  accordance with the recommended standards and specifications as outlined in the AASM Manual for the Scoring of  Sleep and Associated Events 2.2.0 (2015).  FINDINGS:  1. Moderate Obstructive Sleep Apnea with AHI 17.5/hr.   2. Central Sleep Apnea with pAHIc 9/hr.  3. Oxygen desaturations as low as 75%.  4. Mild snoring was present. O2 sats were < 88% for 2.6 min.  5. Total sleep time was 7 hrs and 17 min.  6. 38.7% of total sleep time was spent in REM sleep.   7. Normal sleep onset latency at 16 min  8. Shortened REM sleep onset latency at 45 min.   9. Total awakenings were 3.  10. Arrhythmia detection: Suggestive of possible brief atrial fibrillation lasting 6 hrs 12 min and 2 seconds. This is not  diagnostic and further testing with outpatient telemetry monitoring is recommended.  DIAGNOSIS:  Moderate Obstructive Sleep Apnea (G47.33) Possible Atrial Fibrillation RECOMMENDATIONS: 1. Clinical correlation of these findings is necessary. The decision to treat obstructive sleep apnea (OSA) is usually  based on the presence of apnea symptoms or the presence of associated medical conditions such as Hypertension,  Congestive Heart Failure, Atrial Fibrillation or Obesity. The most common symptoms of OSA are snoring,  gasping for  breath while sleeping, daytime sleepiness and fatigue.  2. Initiating apnea therapy is recommended given the presence of symptoms and/or associated conditions.  Recommend proceeding with one of the following:  a. Auto-CPAP therapy with a pressure range of 5-20cm H2O.  b. An oral appliance (OA) that can be obtained from certain dentists with expertise in sleep medicine. These are  primarily of use in non-obese patients with mild and moderate disease.  c. An ENT consultation which may be useful to look for specific causes of obstruction and possible treatment  options.  d. If patient is intolerant to PAP therapy, consider referral to ENT for evaluation for hypoglossal nerve stimulator.  3. Close follow-up is necessary to ensure success with CPAP or oral appliance therapy for maximum benefit . 4. A follow-up oximetry study on CPAP is recommended to assess the adequacy of therapy and determine the need  for supplemental oxygen or the potential need for Bi-level therapy. An arterial blood gas to determine the adequacy of  baseline ventilation and oxygenation should also be considered. 5. Healthy sleep recommendations include: adequate nightly sleep (normal 7-9 hrs/night), avoidance of caffeine after  noon and alcohol near bedtime, and maintaining a sleep environment that is cool, dark and quiet. 6. Weight loss for overweight patients is recommended. Even modest amounts of weight loss can significantly  improve the severity of sleep apnea. 7. Snoring recommendations include: weight loss where appropriate, side sleeping, and avoidance of alcohol before  bed. 8. Operation of motor vehicle should not be performed when sleepy.  Signature: Wilbert Bihari, MD; [facility]; Diplomat, American Board of Sleep  Medicine Electronically  Signed: 07/21/2023 1:35:20 PM

## 2023-07-21 NOTE — Telephone Encounter (Signed)
 Notified patient of sleep study results and recommendations. All questions were answered and patient verbalized understanding. CPAP Titration ordered today.

## 2023-07-23 ENCOUNTER — Telehealth: Payer: Self-pay

## 2023-07-23 NOTE — Progress Notes (Signed)
 Electrophysiology Office Note:   Date:  07/25/2023  ID:  Jerry Velazquez, [name] 27-Nov-1945, MRN [id number]  Primary Cardiologist: Dorn Lesches, MD Electrophysiologist: [name], MD      History of Present Illness:   Jerry Velazquez is a 78 y.o. male with h/o bicuspid aortic valve, ascending thoracic aortic aneurysm, hypertension, persistent atrial fibrillation who is being seen today for evaluation at the request of Dr. Lesches.  Discussed the use of AI scribe software for clinical note transcription with the patient, who gave verbal consent to proceed.  History of Present Illness Jerry Velazquez is a 78 year old male with atrial fibrillation and aortic valve stenosis who presents for follow-up on heart monitor results and management of his conditions.  He has persistent atrial fibrillation and was monitored for heart rate irregularities. The monitor results indicated continuous atrial fibrillation (100% burden( with a highest recorded heart rate of 164 beats per minute and an average heart rate of 70 beats per minute. He experiences episodes of lightheadedness during periods of increased heart rate, particularly overnight or early morning. His heart rate occasionally slows, with the longest pause between beats being 3.6 seconds. He is currently using metoprolol  on an as-needed basis to manage fast heart rates.  He has a history of aortic valve stenosis. Additionally, he has an aneurysm in the ascending aorta, which has been monitored since late 2018. He was not in atrial fibrillation prior to his last scan. He has been in atrial fibrillation since approximately September of the previous year.  He has been diagnosed with sleep apnea and has completed a sleep study. He is awaiting CPAP titration to manage his sleep apnea.   Review of systems complete and found to be negative unless listed in HPI.   EP Information / Studies Reviewed:    EKG is not ordered today. EKG from 06/26/23 reviewed which  showed AFD      Cardiac Monitor 05/2023:   Echo 09/12/22:  1. Left ventricular ejection fraction, by estimation, is 60 to 65%. The  left ventricle has normal function. The left ventricle has no regional  wall motion abnormalities. There is mild left ventricular hypertrophy of  the basal-septal segment. Left  ventricular diastolic parameters are indeterminate.   2. Right ventricular systolic function is normal. The right ventricular  size is normal.   3. The mitral valve is normal in structure. Mild mitral valve  regurgitation. No evidence of mitral stenosis.   4. The aortic valve is bicuspid. Aortic valve regurgitation is mild.  Moderate aortic valve stenosis.   5. Aortic dilatation noted. There is moderate dilatation of the aortic  root, measuring 49 mm. There is severe dilatation of the ascending aorta,  measuring 50 mm.   Echo 07/08/23:   1. Left ventricular ejection fraction, by estimation, is 55 to 60%. The  left ventricle has normal function. The left ventricle has no regional  wall motion abnormalities. There is mild concentric left ventricular  hypertrophy. Left ventricular diastolic  function could not be evaluated.   2. Right ventricular systolic function is normal. The right ventricular  size is normal. There is normal pulmonary artery systolic pressure. The  estimated right ventricular systolic pressure is 21.8 mmHg.   3. Left atrial size was moderately dilated.   4. The mitral valve is normal in structure. Trivial mitral valve  regurgitation. No evidence of mitral stenosis.   5. The aortic valve is abnormal. There is severe calcifcation of the  aortic valve. There  is severe thickening of the aortic valve. Aortic valve  regurgitation is mild. Severe aortic valve stenosis. Aortic regurgitation  PHT measures 485 msec. Aortic valve   area, by VTI measures 0.74 cm. Aortic valve mean gradient measures 28.4  mmHg. Aortic valve Vmax measures 3.54 m/s.   6. Aortic  dilatation noted. There is mild dilatation of the aortic root,  measuring 41 mm. There is moderate dilatation of the ascending aorta,  measuring 48 mm.   7. The inferior vena cava is normal in size with greater than 50%  respiratory variability, suggesting right atrial pressure of 3 mmHg.   Risk Assessment/Calculations:    CHA2DS2-VASc Score = 4   This indicates a 4.8% annual risk of stroke. The patient's score is based upon: CHF History: 0 HTN History: 1 Diabetes History: 0 Stroke History: 0 Vascular Disease History: 1 Age Score: 2 Gender Score: 0     STOP-Bang Score:  5       Physical Exam:   VS:  BP (!) 149/79   Pulse 78   Ht 6' 1 (1.854 m)   Wt 199 lb (90.3 kg)   SpO2 98%   BMI 26.25 kg/m    Wt Readings from Last 3 Encounters:  07/24/23 199 lb (90.3 kg)  06/26/23 199 lb 6.4 oz (90.4 kg)  05/28/23 194 lb 9.6 oz (88.3 kg)     GEN: Well nourished, well developed in no acute distress NECK: No JVD CARDIAC: Normal rate, irregular RESPIRATORY:  Clear to auscultation without rales, wheezing or rhonchi  ABDOMEN: Soft, non-distended EXTREMITIES:  Trace edema; No deformity   ASSESSMENT AND PLAN:    #. Longstanding persistent atrial fibrillation: Appears to be asymptomatic. Has not been in sinus since at least Sept 2024. Normal heart rates during daytime. Did have some RVR on monitor, unclear if this is the cause of his dizziness.  #. Nocturnal bradycardia: Pauses up to 3.6s in duration during sleep. -No current indication for pacing.  -Recently tested positive for sleep apnea. He would like to pursue a cardioversion after starting treatment with his CPAP.  Explained that it is unlikely he will maintain sinus for very long without AAD therapy or ablation.  We can attempt trial of sinus rhythm to assess for symptoms that are attributable to AF.  Even in the absence of symptoms rhythm control would not be unreasonable given that he has severe AS. -Metoprolol  as needed.   #.  Secondary hypercoagulable state due to atrial fibrillation: - Continue Eliquis  5mg  BID.   #. Severe aortic stenosis:  #. Ascending aortic aneurysm:  - I will discuss with his primary cardiologist, Dr. Wadie, regarding patient's surgical options.  If he were to undergo open heart surgery, then recommend left atrial appendage clip at time of surgery.  I suspect he may end up with permanent pacemaker after surgery.  #. Hypertension -At goal today.  Recommend checking blood pressures 1-2 times per week at home and recording the values.  Recommend bringing these recordings to the primary care physician.  #. OSA: Recently diagnosed.  - Encouraged compliance with CPAP once initiated.  Follow up with EP APP in 2 months.   Signed, [name], MD

## 2023-07-23 NOTE — Telephone Encounter (Signed)
**Note De-Identified Meliana Canner Obfuscation** I started a CPAP Titration PA through the [facility] provider portal and it is currently pending review. Outpatient Authorization [id number]

## 2023-07-24 ENCOUNTER — Ambulatory Visit: Attending: Cardiology | Admitting: Cardiology

## 2023-07-24 ENCOUNTER — Encounter: Payer: Self-pay | Admitting: Cardiology

## 2023-07-24 VITALS — BP 149/79 | HR 78 | Ht 73.0 in | Wt 199.0 lb

## 2023-07-24 DIAGNOSIS — I4811 Longstanding persistent atrial fibrillation: Secondary | ICD-10-CM | POA: Diagnosis not present

## 2023-07-24 DIAGNOSIS — I350 Nonrheumatic aortic (valve) stenosis: Secondary | ICD-10-CM

## 2023-07-24 DIAGNOSIS — D6869 Other thrombophilia: Secondary | ICD-10-CM | POA: Diagnosis not present

## 2023-07-24 DIAGNOSIS — G4733 Obstructive sleep apnea (adult) (pediatric): Secondary | ICD-10-CM | POA: Diagnosis not present

## 2023-07-24 DIAGNOSIS — R001 Bradycardia, unspecified: Secondary | ICD-10-CM

## 2023-07-24 DIAGNOSIS — I7121 Aneurysm of the ascending aorta, without rupture: Secondary | ICD-10-CM | POA: Diagnosis not present

## 2023-07-24 DIAGNOSIS — I10 Essential (primary) hypertension: Secondary | ICD-10-CM | POA: Diagnosis not present

## 2023-07-24 NOTE — Patient Instructions (Signed)
 Medication Instructions:  Your physician recommends that you continue on your current medications as directed. Please refer to the Current Medication list given to you today.   *If you need a refill on your cardiac medications before your next appointment, please call your pharmacy*  Follow-Up: At [facility], you and your health needs are our priority.  As part of our continuing mission to provide you with exceptional heart care, our providers are all part of one team.  This team includes your primary Cardiologist (physician) and Advanced Practice Providers or APPs (Physician Assistants and Nurse Practitioners) who all work together to provide you with the care you need, when you need it.  Your next appointment:   8 weeks  Provider:   You will see one of the following Advanced Practice Providers on your designated Care Team:   Charlies Arthur, PA-C Michael Andy Tillery, PA-C Suzann Riddle, NP Daphne Barrack, NP

## 2023-07-30 ENCOUNTER — Encounter: Payer: Self-pay | Admitting: Cardiovascular Disease

## 2023-07-30 ENCOUNTER — Ambulatory Visit: Attending: Cardiovascular Disease | Admitting: Cardiovascular Disease

## 2023-07-30 VITALS — BP 138/80 | HR 82 | Ht 73.0 in | Wt 197.0 lb

## 2023-07-30 DIAGNOSIS — R001 Bradycardia, unspecified: Secondary | ICD-10-CM | POA: Diagnosis not present

## 2023-07-30 DIAGNOSIS — Q2381 Bicuspid aortic valve: Secondary | ICD-10-CM | POA: Diagnosis not present

## 2023-07-30 DIAGNOSIS — I10 Essential (primary) hypertension: Secondary | ICD-10-CM

## 2023-07-30 DIAGNOSIS — I480 Paroxysmal atrial fibrillation: Secondary | ICD-10-CM | POA: Diagnosis not present

## 2023-07-30 DIAGNOSIS — E785 Hyperlipidemia, unspecified: Secondary | ICD-10-CM | POA: Diagnosis not present

## 2023-07-30 NOTE — Progress Notes (Signed)
 07/30/2023 Jerry Velazquez   1945/12/22  [id number]  Primary Physician Jerry Velazquez DASEN, MD Primary Cardiologist: Jerry JINNY Lesches MD Jerry Velazquez, [place]  HPI:  Jerry Velazquez is a 78 y.o.  thin appearing married Caucasian male father of no children who is retired Production manager.  He was referred by Jerry Cower, PA-C for cardiovascular evaluation because of aortic insufficiency.    I last  saw him in the office 05/28/2023.  He has no cardiac risk factors.  He was seen because of palpitations with a recent Holter monitor that showed PVCs and short atrial runs.  2D echo performed because of his soft murmur revealed a bicuspid aortic valve with mild stenosis and mild to moderate aortic insufficiency with normal LV size and function.  There was noted mild dilatation of the thoracic aorta.  He also was experiences a quivering sensation in his chest.  He has denied chest pain or shortness of breath in the past.   I referred him to Dr. Kelsie and Dr. Lucas as well.  Ultimately he was begun on Eliquis  oral anticoagulation.  A chest CTA was performed 10/31/2017 revealing a 4.9 cm ascending thoracic aortic aneurysm.  This will be followed on an semiannual basis.  He will ultimately need thoracic aortic repair as well as AVR.  It is possible that he would also benefit from a surgical maze procedure during the operation and removal of his left atrial appendage but I will leave this to Dr. Lucas to decide.   An echo performed 09/12/2022 revealed normal LV systolic function, grade 1 diastolic dysfunction, mild to moderate AI, moderate AS (bicuspid aortic valve) with an aortic valve area of 1.19 cm cm and a peak gradient of 41 mmHg.  This was unchanged from his prior echo.  Recent CTA performed 09/12/2021 revealed his thoracic aorta measured 48 mm, not at threshold for consideration for replacement.  He is followed by Dr. [name].  He has complained of several episodes of dizziness while  at rest and also amaurosis which she has had for many years.  He says that his exercise stamina has mildly reduced.  He is very active.  Since I saw him 2 months ago I did get a event monitor on 07/04/2023 that showed persistent A-fib with 99% burden with pauses up to 3.6 seconds mostly in the early a.m. hours.  His 2D echo performed 07/08/2023 showed progression of his aortic stenosis now to severe with a valve area of 0.74 cm and a peak gradient of 50 mmHg.  He remains only minimally symptomatic.  I did refer him to Dr. Kennyth, EP, who talked about rhythm control and cardioversion.  I am a little hesitant to allow him to undergo cardioversion with severe aortic stenosis given his minimal symptoms.   Current Meds  Medication Sig   apixaban  (ELIQUIS ) 5 MG TABS tablet TAKE 1 TABLET BY MOUTH TWICE A DAY   atorvastatin  (LIPITOR) 20 MG tablet TAKE 1 TABLET BY MOUTH EVERY DAY   cyclobenzaprine  (FLEXERIL ) 10 MG tablet Take 1 tablet (10 mg total) by mouth 3 (three) times daily as needed for muscle spasms.   GLUCOSAMINE HCL PO Take 2,000 mg by mouth daily.   lisinopril  (ZESTRIL ) 5 MG tablet TAKE 1 TABLET BY MOUTH EVERY DAY   metoprolol  tartrate (LOPRESSOR ) 25 MG tablet Take 0.5 tablets (12.5 mg total) by mouth every 6 (six) hours as needed (palpitations).   Omega-3 Fatty Acids (FISH OIL) 1000  MG CAPS Take 1,000 mg by mouth daily.   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   Social Drivers of Corporate investment banker Strain: Low Risk  (01/23/2023)   Overall Financial Resource Strain (CARDIA)    Difficulty of Paying Living Expenses: Not hard at all  Food Insecurity: No Food Insecurity (01/23/2023)   Hunger Vital Sign    Worried About Running Out of Food in the Last Year: Never true    Ran Out of Food in the Last Year: Never true  Transportation Needs: No Transportation Needs (01/23/2023)   PRAPARE - Administrator, Civil Service (Medical): No    Lack of Transportation (Non-Medical): No  Physical Activity: Sufficiently Active (01/23/2023)   Exercise Vital Sign    Days of Exercise per Week: 6 days    Minutes of Exercise per Session: 30 min  Stress: No Stress Concern Present (01/23/2023)   Jerry Velazquez of Occupational Health - Occupational Stress Questionnaire    Feeling of Stress : Not at all  Social Connections: Moderately Integrated (01/23/2023)   Social Connection and Isolation Panel    Frequency of Communication with Friends and Family: More than three times a week    Frequency of Social Gatherings with Friends and Family: Three times a week    Attends Religious Services: Never    Active Member of Clubs or Organizations: Yes    Attends Banker Meetings: More than 4 times per year    Marital Status: Married  Catering manager Violence: Not At Risk (01/23/2023)   Humiliation, Afraid, Rape, and Kick questionnaire    Fear of Current or Ex-Partner: No    Emotionally Abused: No    Physically Abused: No    Sexually Abused: No     Review of Systems: General: negative for chills, fever, night sweats or weight changes.  Cardiovascular: negative for chest pain, dyspnea on exertion,  edema, orthopnea, palpitations, paroxysmal nocturnal dyspnea or shortness of breath Dermatological: negative for rash Respiratory: negative for cough or wheezing Urologic: negative for hematuria Abdominal: negative for nausea, vomiting, diarrhea, bright red blood per rectum, melena, or hematemesis Neurologic: negative for visual changes, syncope, or dizziness All other systems reviewed and are otherwise negative except as noted above.    Blood pressure 138/80, pulse 82, height 6' 1 (1.854 m), weight 197 lb (89.4 kg), SpO2 97%.  General appearance: alert and no distress Neck: no adenopathy, no carotid bruit, no JVD, supple, symmetrical, trachea midline, and thyroid  not  enlarged, symmetric, no tenderness/mass/nodules Lungs: clear to auscultation bilaterally Heart: irregularly irregular rhythm and 2/6 outflow tract murmur consistent with aortic stenosis. Extremities: extremities normal, atraumatic, no cyanosis or edema Pulses: 2+ and symmetric Skin: Skin color, texture, turgor normal. No rashes or lesions Neurologic: Grossly normal  EKG EKG Interpretation Date/Time:  Wednesday July 30 2023 10:47:07 EDT Ventricular Rate:  82 PR Interval:    QRS Duration:  98 QT Interval:  376 QTC Calculation: 439 R Axis:   -43  Text Interpretation: Atrial fibrillation Left axis deviation Incomplete right bundle branch block Left ventricular hypertrophy ( R in aVL , Cornell product , Romhilt-Estes ) When compared with ECG of 26-Jun-2023 08:35, No significant change was found Confirmed by Court Carrier [phone]) on 07/30/2023 11:05:04 AM    ASSESSMENT AND PLAN:   Dyslipidemia History of dyslipidemia on atorvastatin  with lipid profile performed 06/02/2023 revealed total cholesterol 121, LDL 61 and HDL 49.  Thoracic aortic aneurysm [facility]) History of thoracic aortic aneurysm measuring 48 to 49 mm unchanged since 2019 followed by Dr. Lucas.  Bicuspid aortic valve History of bicuspid aortic valve with  recent echo that showed progression of his aortic stenosis now to severe with an aortic valve area measured at 0.74 cm with a peak gradient of 50 increased from 41 on 07/04/2023.  He still remains asymptomatic.  Will continue to follow him closely.  At some point in the near future, if and when he develops symptoms, he will need a Bentall procedure plus or minus CABG based on his coronary anatomy.  He would also benefit from having a left atrial appendage clipping and a left atrial Maze procedure given his atrial fibrillation.  Essential hypertension History of essential hypertension blood pressure measured today 138/80.  He is on lisinopril .  Paroxysmal atrial fibrillation (HCC) History of persistent A-fib with up to 3.6-second pauses mostly in the a.m. hours which are asymptomatic.  He has been dizzy in the past however.  He is on Eliquis  oral anticoagulation.  I did send him to Dr. Kennyth who talked about rhythm control and cardioversion.  I am little hesitant to allow him to undergo this given his severe aortic stenosis.     Carrier DOROTHA Court MD FACP,FACC,FAHA, [facility] 07/30/2023 11:23 AM

## 2023-07-30 NOTE — Assessment & Plan Note (Signed)
 History of dyslipidemia on atorvastatin  with lipid profile performed 06/02/2023 revealed total cholesterol 121, LDL 61 and HDL 49.

## 2023-07-30 NOTE — Assessment & Plan Note (Signed)
 History of thoracic aortic aneurysm measuring 48 to 49 mm unchanged since 2019 followed by Dr. Lucas.

## 2023-07-30 NOTE — Assessment & Plan Note (Signed)
 History of persistent A-fib with up to 3.6-second pauses mostly in the a.m. hours which are asymptomatic.  He has been dizzy in the past however.  He is on Eliquis  oral anticoagulation.  I did send him to Dr. Kennyth who talked about rhythm control and cardioversion.  I am little hesitant to allow him to undergo this given his severe aortic stenosis.

## 2023-07-30 NOTE — Assessment & Plan Note (Signed)
 History of bicuspid aortic valve with recent echo that showed progression of his aortic stenosis now to severe with an aortic valve area measured at 0.74 cm with a peak gradient of 50 increased from 41 on 07/04/2023.  He still remains asymptomatic.  Will continue to follow him closely.  At some point in the near future, if and when he develops symptoms, he will need a Bentall procedure plus or minus CABG based on his coronary anatomy.  He would also benefit from having a left atrial appendage clipping and a left atrial Maze procedure given his atrial fibrillation.

## 2023-07-30 NOTE — Patient Instructions (Signed)
 Medication Instructions:  Your physician recommends that you continue on your current medications as directed. Please refer to the Current Medication list given to you today.  *If you need a refill on your cardiac medications before your next appointment, please call your pharmacy*   Testing/Procedures: Your physician has requested that you have an echocardiogram. Echocardiography is a painless test that uses sound waves to create images of your heart. It provides your doctor with information about the size and shape of your heart and how well your heart's chambers and valves are working. This procedure takes approximately one hour. There are no restrictions for this procedure. Please do NOT wear cologne, perfume, aftershave, or lotions (deodorant is allowed). Please arrive 15 minutes prior to your appointment time.  Please note: We ask at that you not bring children with you during ultrasound (echo/ vascular) testing. Due to room size and safety concerns, children are not allowed in the ultrasound rooms during exams. Our front office staff cannot provide observation of children in our lobby area while testing is being conducted. An adult accompanying a patient to their appointment will only be allowed in the ultrasound room at the discretion of the ultrasound technician under special circumstances. We apologize for any inconvenience.  **To do in January 2026**   Follow-Up: At [facility], you and your health needs are our priority.  As part of our continuing mission to provide you with exceptional heart care, our providers are all part of one team.  This team includes your primary Cardiologist (physician) and Advanced Practice Providers or APPs (Physician Assistants and Nurse Practitioners) who all work together to provide you with the care you need, when you need it.  Your next appointment:   6 month(s)  Provider:   Dorn Lesches, MD    We recommend signing up for the patient  portal called MyChart.  Sign up information is provided on this After Visit Summary.  MyChart is used to connect with patients for Virtual Visits (Telemedicine).  Patients are able to view lab/test results, encounter notes, upcoming appointments, etc.  Non-urgent messages can be sent to your provider as well.   To learn more about what you can do with MyChart, go to [email or web address].

## 2023-07-30 NOTE — Assessment & Plan Note (Signed)
 History of essential hypertension blood pressure measured today 138/80.  He is on lisinopril .

## 2023-07-31 NOTE — Telephone Encounter (Signed)
**Note De-Identified [facility] Obfuscation** Letter received from [facility] Barbar fax stating that they have approved the pts CPAP Titration from 07/23/2023 until 10/21/2023. Authorization #: [id number]  I have transferred the order to the Sleep Lab.

## 2023-08-27 ENCOUNTER — Other Ambulatory Visit: Payer: Self-pay | Admitting: Surgery

## 2023-08-27 DIAGNOSIS — I712 Thoracic aortic aneurysm, without rupture, unspecified: Secondary | ICD-10-CM

## 2023-08-28 ENCOUNTER — Encounter: Payer: Self-pay | Admitting: Cardiology

## 2023-09-01 ENCOUNTER — Ambulatory Visit: Admitting: Cardiology

## 2023-09-10 ENCOUNTER — Ambulatory Visit (HOSPITAL_BASED_OUTPATIENT_CLINIC_OR_DEPARTMENT_OTHER): Attending: Cardiology | Admitting: Cardiology

## 2023-09-10 DIAGNOSIS — I351 Nonrheumatic aortic (valve) insufficiency: Secondary | ICD-10-CM | POA: Insufficient documentation

## 2023-09-10 DIAGNOSIS — I4811 Longstanding persistent atrial fibrillation: Secondary | ICD-10-CM | POA: Insufficient documentation

## 2023-09-10 DIAGNOSIS — I10 Essential (primary) hypertension: Secondary | ICD-10-CM | POA: Insufficient documentation

## 2023-09-10 DIAGNOSIS — G4733 Obstructive sleep apnea (adult) (pediatric): Secondary | ICD-10-CM | POA: Insufficient documentation

## 2023-09-10 DIAGNOSIS — R002 Palpitations: Secondary | ICD-10-CM | POA: Diagnosis not present

## 2023-09-10 DIAGNOSIS — R001 Bradycardia, unspecified: Secondary | ICD-10-CM | POA: Insufficient documentation

## 2023-09-10 DIAGNOSIS — I471 Supraventricular tachycardia, unspecified: Secondary | ICD-10-CM | POA: Diagnosis not present

## 2023-09-17 NOTE — Progress Notes (Unsigned)
 Cardiology Office Note:  .   Date:  09/17/2023  ID:  Jerry Velazquez, DOB [birth year], MRN [id number] PCP: Jerry Velazquez DASEN, MD  Jerry Velazquez Cardiologist:  Jerry Lesches, MD Electrophysiologist:  Jerry Kitty, MD {  History of Present Illness: .   Jerry Velazquez is a 78 y.o. male w/PMHx of  HTN Bicuspid AV, ascending thoracic aortic aneurysm  AFib  Referred to Dr. Kitty June 2025, w/symptoms described as lightheadedness and temporary blindness, rare in occurrence (dating back to high scool, though had resolved until recently) > monitoring noted nocturnal pauses  Still wearing the monitor > though during the day w/symptoms, to date at least no reported abnormal findings w/them Planned for f/u  He saw Dr. Kitty again 07/24/23, monitoring with 100% AF burden, episodes of lightheadedness during periods of increased heart rate, particularly overnight or early morning. His heart rate occasionally slows, with the longest pause between beats being 3.6 seconds. He is currently using metoprolol  on an as-needed basis  + sleep test pending CPAP titration Echo with severe AS No current indication for PPM Pt wanted to pursue DCCV after he started CPAP to try and regain SR Suspected that maintenance of SR would be difficult without an AAD though pt preferred to try . Planned to refer back to Dr. Lesches for further discussion on his AS Suspect surgical need and if so > rec LAA clip with probable development of permanent AFib   He saw Dr. Lesches 07/30/23, reported as asymptomatic from AS , with plans to follow closely > eventuially needing a Bentall procedure plus or minus CABG and a left atrial appendage clipping   Today's visit is scheduled as an 8 week visit ROS:   He feels well Doesn't formally exercise, but he keeps busy, describes he/his wife as active retirees.  No CP, palpitations or cardiac awareness No SOB, reports good exertional capacity No dizzy spells, near syncope or  syncope No bleeding or signs of bleeding  He has completed his CPAP titration >> pending delivery of his equipment Reports plan with Dr.l Velazquez is to see him/do echos Q 35mo and at some point mentioned he would need to have valve surgery  Arrhythmia/AAD hx AFib goes back to ~ 2019  Studies Reviewed: SABRA    EKG not done today  Echo 07/08/23:   1. Left ventricular ejection fraction, by estimation, is 55 to 60%. The  left ventricle has normal function. The left ventricle has no regional  wall motion abnormalities. There is mild concentric left ventricular  hypertrophy. Left ventricular diastolic  function could not be evaluated.   2. Right ventricular systolic function is normal. The right ventricular  size is normal. There is normal pulmonary artery systolic pressure. The  estimated right ventricular systolic pressure is 21.8 mmHg.   3. Left atrial size was moderately dilated.   4. The mitral valve is normal in structure. Trivial mitral valve  regurgitation. No evidence of mitral stenosis.   5. The aortic valve is abnormal. There is severe calcifcation of the  aortic valve. There is severe thickening of the aortic valve. Aortic valve  regurgitation is mild. Severe aortic valve stenosis. Aortic regurgitation  PHT measures 485 msec. Aortic valve   area, by VTI measures 0.74 cm. Aortic valve mean gradient measures 28.4  mmHg. Aortic valve Vmax measures 3.54 m/s.   6. Aortic dilatation noted. There is mild dilatation of the aortic root,  measuring 41 mm. There is moderate dilatation of the ascending  aorta,  measuring 48 mm.   7. The inferior vena cava is normal in size with greater than 50%  respiratory variability, suggesting right atrial pressure of 3 mmHg.   06/2023, monitor 1: Persistent A-fib with variable ventricular response (99% A-fib burden burden).  He did have pauses up to 3.6 seconds. 2: Needs return office visit to discuss.  Echo 09/12/22:  1. Left ventricular ejection  fraction, by estimation, is 60 to 65%. The  left ventricle has normal function. The left ventricle has no regional  wall motion abnormalities. There is mild left ventricular hypertrophy of  the basal-septal segment. Left  ventricular diastolic parameters are indeterminate.   2. Right ventricular systolic function is normal. The right ventricular  size is normal.   3. The mitral valve is normal in structure. Mild mitral valve  regurgitation. No evidence of mitral stenosis.   4. The aortic valve is bicuspid. Aortic valve regurgitation is mild.  Moderate aortic valve stenosis.   5. Aortic dilatation noted. There is moderate dilatation of the aortic  root, measuring 49 mm. There is severe dilatation of the ascending aorta,  measuring 50 mm.    Risk Assessment/Calculations:    Physical Exam:   VS:  There were no vitals taken for this visit.   Wt Readings from Last 3 Encounters:  09/10/23 195 lb (88.5 kg)  07/30/23 197 lb (89.4 kg)  07/24/23 199 lb (90.3 kg)    GEN: Well nourished, well developed in no acute distress NECK: No JVD; No carotid bruits CARDIAC: irreg-irreg, 2-3/6SM, rubs, gallops RESPIRATORY:  CTA b/l without rales, wheezing or rhonchi  ABDOMEN: Soft, non-tender, non-distended EXTREMITIES: No edema; No deformity   ASSESSMENT AND PLAN: .    Longstanding persistent AFib CHA2DS2Vasc is 3, on Eliquis , appropriately dosed Dr. Court has felt reluctant to pursue DCCV Rate controlled and minimal if any symptoms  Nocturnal pauses > not felt to require PPM Pending CPAP delivery   Bicuspid AV Severe AS Ascending aortic aneurysm No symptoms Defferred to Dr. Brenna  HTN No change today Deferred to Dr. court  Secondary hypercoagulable state 2/2 AFib    Dispo: he will follow with Dr. Court > due for en echo/visit in January, I think he can see EP team PRN, he is agreeable  Signed, Jerry Macario Arthur, PA-C

## 2023-09-18 ENCOUNTER — Ambulatory Visit: Attending: Physician Assistant | Admitting: Physician Assistant

## 2023-09-18 ENCOUNTER — Encounter: Payer: Self-pay | Admitting: Physician Assistant

## 2023-09-18 VITALS — BP 140/58 | HR 76 | Ht 73.0 in | Wt 194.0 lb

## 2023-09-18 DIAGNOSIS — D6869 Other thrombophilia: Secondary | ICD-10-CM | POA: Diagnosis not present

## 2023-09-18 DIAGNOSIS — I4811 Longstanding persistent atrial fibrillation: Secondary | ICD-10-CM | POA: Diagnosis not present

## 2023-09-18 DIAGNOSIS — I10 Essential (primary) hypertension: Secondary | ICD-10-CM | POA: Diagnosis not present

## 2023-09-18 DIAGNOSIS — I350 Nonrheumatic aortic (valve) stenosis: Secondary | ICD-10-CM | POA: Diagnosis not present

## 2023-09-18 NOTE — Patient Instructions (Addendum)
 Medication Instructions:   Your physician recommends that you continue on your current medications as directed. Please refer to the Current Medication list given to you today.   *If you need a refill on your cardiac medications before your next appointment, please call your pharmacy*   Lab Work:  NONE ORDERED  TODAY     If you have labs (blood work) drawn today and your tests are completely normal, you will receive your results only by: MyChart Message (if you have MyChart) OR A paper copy in the mail If you have any lab test that is abnormal or we need to change your treatment, we will call you to review the results.    Testing/Procedures: NONE ORDERED  TODAY    Follow-Up: At [facility], you and your health needs are our priority.  As part of our continuing mission to provide you with exceptional heart care, our providers are all part of one team.  This team includes your primary Cardiologist (physician) and Advanced Practice Providers or APPs (Physician Assistants and Nurse Practitioners) who all work together to provide you with the care you need, when you need it.   Your next appointment:   WITH DR COURT AFTER ECHOCARDIOGRAM    WITH EP DEPARTMENT CONTACT [facility] 336 [phone] AS NEEDED FOR  ANY CARDIAC RELATED SYMPTOMS      We recommend signing up for the patient portal called MyChart.  Sign up information is provided on this After Visit Summary.  MyChart is used to connect with patients for Virtual Visits (Telemedicine).  Patients are able to view lab/test results, encounter notes, upcoming appointments, etc.  Non-urgent messages can be sent to your provider as well.   To learn more about what you can do with MyChart, go to [email or web address].   Other Instructions

## 2023-09-21 NOTE — Procedures (Addendum)
 Indications for Polysomnography The patient is a 78 year old Male who is 6' 1 and weighs 195.0 lbs. His BMI equals 25.8.  A full night titration treatment study was performed.  Medications Taken:NO MEDICATIONS TAKEN. Polysomnogram Data A full night polysomnogram recorded the standard physiologic parameters including EEG, EOG, EMG, EKG, nasal and oral airflow.  Respiratory parameters of chest and abdominal movements were recorded with Respiratory Inductance Plethysmography belts.   Oxygen saturation was recorded by pulse oximetry.  Sleep Architecture The total recording time of the polysomnogram was 442.8 minutes.  The total sleep time was 286.0 minutes.  The patient spent 9.4% of total sleep time in Stage N1, 74.1% in Stage N2, 0.0% in Stages N3, and 16.4% in REM.  Sleep latency was 10.9 minutes.   REM latency was 58.0 minutes.  Sleep Efficiency was 64.6%.  Wake after Sleep Onset time was 146.0 minutes.  Titration Summary The patient was titrated at pressures ranging from 6/2 cm/H20 up to 20/12 cm/H20 with backup rate of 13/min.  The last pressure used in the study was 17/9 cm/H20 with back up rate of 13 breaths/min.  Respiratory Events The polysomnogram revealed a presence of 0 obstructive, 30 central, and 18 mixed apneas resulting in an Apnea index of 10.1 events per hour.  There were 39 hypopneas (GreaterEqual to3% desaturation and/or arousal) resulting in an Apnea\Hypopnea Index  (AHI GreaterEqual to3% desaturation and/or arousal) of 18.3 events per hour.  There were 19 hypopneas (GreaterEqual to4% desaturation) resulting in an Apnea\Hypopnea Index (AHI GreaterEqual to4% desaturation) of 14.1 events per hour.  There were 60  Respiratory Effort Related Arousals resulting in a RERA index of 12.6 events per hour. The Respiratory Disturbance Index is 30.8 events per hour.  The snore index was 134.3 events per hour.  Mean oxygen saturation was 96.1%.  The lowest oxygen saturation during sleep  was 88.0%.  Time spent LessEqual to88% oxygen saturation was  minutes.  Limb Activity There were 56 limb movements recorded.  Of this total, 56 were classified as PLMs.  Of the PLMs, 0 were associated with arousals.  The Limb Movement index was 11.7 per hour while the PLM index was 11.7 per hour.  Cardiac Summary The average pulse rate was 54.1 bpm.  The minimum pulse rate was 38.0 bpm while the maximum pulse rate was 76.0 bpm.  Cardiac rhythm was abnormal with atrial fibrillation.   Diagnosis: Obstructive Sleep Apnea Central Sleep Apnea Unsuccessful CPAP titration due to ongoing respiratory events. Successful BiPAP S/T titration    Recommendations: 1. Recommend a trial of ResMed BiPAP S/T at 18/14cm H2O with EPR 2 and backup rate of 13 breaths/min, heated humidity and a large ResMed F20 full face mask. 2. Close follow-up is necessary to ensure success with CPAP or oral appliance therapy for maximum benefit. 3. A follow-up oximetry study on CPAP is recommended to assess the adequacy of therapy and determine the need for supplemental oxygen or the potential need for Bi-level therapy.  An arterial blood gas to determine the adequacy of baseline ventilation and  oxygenation should also be considered. 4.  Healthy sleep recommendations include:  adequate nightly sleep (normal 7-9 hrs/night), avoidance of caffeine after noon and alcohol near bedtime, and maintaining a sleep environment that is cool, dark and quiet. 5. Weight loss for overweight patients is recommended.  Even modest amounts of weight loss can significantly improve the severity of sleep apnea. 6. Snoring recommendations include:  weight loss where appropriate, side sleeping, and avoidance of alcohol before  bed. 7. Operation of motor vehicle should be avoided when sleepy.    This study was personally reviewed and electronically signed by: SHLOMO WILBERT SAUNDERS Accredited Board Certified in Sleep Medicine Date/Time: 09/07.2025  3:27PM

## 2023-09-26 ENCOUNTER — Telehealth: Payer: Self-pay | Admitting: *Deleted

## 2023-09-26 DIAGNOSIS — G4733 Obstructive sleep apnea (adult) (pediatric): Secondary | ICD-10-CM

## 2023-09-26 DIAGNOSIS — I10 Essential (primary) hypertension: Secondary | ICD-10-CM

## 2023-09-26 NOTE — Telephone Encounter (Signed)
-----   Message from Wilbert Bihari sent at 09/21/2023  3:30 PM EDT ----- Please let patient know that they had a successful PAP titration and let DME know that orders are in EPIC.  Please set up 6 week OV with me. Needs ONO on BiPAP

## 2023-09-26 NOTE — Telephone Encounter (Signed)
 The patient has been notified of the result and verbalized understanding.  All questions (if any) were answered. Jerry Velazquez, CMA 09/26/2023 5:45 PM     Upon patient request DME selection is [facility] Patient understands he will be contacted by [facility] to set up his cpap. Patient understands to call if [facility] does not contact him with new setup in a timely manner. Patient understands they will be called once confirmation has been received from [facility] that they have received their new machine to schedule 10 week follow up appointment.   [facility] notified of new cpap order  Please add to airview Patient was grateful for the call and thanked me.

## 2023-10-01 ENCOUNTER — Ambulatory Visit (HOSPITAL_COMMUNITY)
Admission: RE | Admit: 2023-10-01 | Discharge: 2023-10-01 | Disposition: A | Discharge status: Home / Self Care | Source: Ambulatory Visit | Attending: Cardiovascular Disease | Admitting: Cardiovascular Disease

## 2023-10-01 DIAGNOSIS — I700 Atherosclerosis of aorta: Secondary | ICD-10-CM | POA: Diagnosis not present

## 2023-10-01 DIAGNOSIS — I7121 Aneurysm of the ascending aorta, without rupture: Secondary | ICD-10-CM | POA: Diagnosis not present

## 2023-10-01 DIAGNOSIS — I712 Thoracic aortic aneurysm, without rupture, unspecified: Secondary | ICD-10-CM | POA: Diagnosis present

## 2023-10-01 MED ORDER — IOHEXOL 350 MG/ML SOLN
100.0000 mL | Freq: Once | INTRAVENOUS | Status: AC | PRN
Start: 2023-10-01 — End: 2023-10-01
  Administered 2023-10-01: 100 mL via INTRAVENOUS

## 2023-10-08 ENCOUNTER — Ambulatory Visit

## 2023-10-08 VITALS — BP 130/73 | HR 80 | Resp 18 | Ht 73.0 in | Wt 198.0 lb

## 2023-10-08 DIAGNOSIS — I712 Thoracic aortic aneurysm, without rupture, unspecified: Secondary | ICD-10-CM

## 2023-10-08 NOTE — Patient Instructions (Signed)

## 2023-10-08 NOTE — Progress Notes (Signed)
 [address] [place] [address]             [phone]            STANISLAV GERVASE [id number] 16-Apr-1945   History of Present Illness:  Jerry Velazquez is a 78 year old man with medical history of hypertension, bicuspid aortic valve, mild aortic insufficiency, severe aortic stenosis, atrial fibrillation, dyslipidemia and paroxysmal ventricular tachycardia presents for continued follow-up of ascending thoracic aortic aneurysm. This has stayed stable in size and measured 4.7 cm on recent CTA of chest on 10/01/2023. He has been seen by cardiology for severe aortic stenosis which they are following based on his symptoms.   He reports that he is doing well.  He reports that he has had some temporary blindness episodes.  They are not consistent at this time. He also has had some dizziness.  Last time he was dizzy was 2 weeks ago.  He denied shortness of breath with exertion, headaches, and fatigue. He states that the episodes are not consistent and happen infrequently.  His blood pressure is well controlled with current medication therapy. He exercises by walking 25-30 daily.   Current Outpatient Medications on File Prior to Visit  Medication Sig Dispense Refill   apixaban  (ELIQUIS ) 5 MG TABS tablet TAKE 1 TABLET BY MOUTH TWICE A DAY 180 tablet 1   atorvastatin  (LIPITOR) 20 MG tablet TAKE 1 TABLET BY MOUTH EVERY DAY 90 tablet 3   cyclobenzaprine  (FLEXERIL ) 10 MG tablet Take 1 tablet (10 mg total) by mouth 3 (three) times daily as needed for muscle spasms. 30 tablet 0   GLUCOSAMINE HCL PO Take 2,000 mg by mouth daily.     lisinopril  (ZESTRIL ) 5 MG tablet TAKE 1 TABLET BY MOUTH EVERY DAY 90 tablet 3   metoprolol  tartrate (LOPRESSOR ) 25 MG tablet Take 0.5 tablets (12.5 mg total) by mouth every 6 (six) hours as needed (palpitations). 30 tablet 0   Omega-3 Fatty Acids (FISH OIL) 1000 MG CAPS Take 1,000 mg by mouth daily.     PFIZER COVID-19 VAC BIVALENT injection       No current facility-administered medications on file prior to visit.     ROS: Review of Systems  Constitutional: Negative.  Negative for fever and malaise/fatigue.  Eyes:        Episodes of temporary blindness   Respiratory: Negative.  Negative for cough, shortness of breath and wheezing.   Cardiovascular: Negative.  Negative for chest pain, palpitations and leg swelling.  Neurological:  Positive for dizziness. Negative for headaches.     BP 130/73 (BP Location: Left Arm)   Pulse 80   Resp 18   Ht 6' 1 (1.854 m)   Wt 198 lb (89.8 kg)   SpO2 97%   BMI 26.12 kg/m   Physical Exam Constitutional:      Appearance: Normal appearance.  HENT:     Head: Normocephalic and atraumatic.  Cardiovascular:     Rate and Rhythm: Normal rate. Rhythm irregularly irregular.     Heart sounds: S1 normal and S2 normal. Murmur heard.     Systolic murmur is present.  Skin:    General: Skin is warm and dry.  Neurological:     General: No focal deficit present.     Mental Status: He is alert and oriented to person, place, and time.      Imaging: CLINICAL DATA:  Aortic aneurysm.   EXAM:  CT ANGIOGRAPHY CHEST WITH CONTRAST   TECHNIQUE: Multidetector CT imaging of the chest was performed using the standard protocol during bolus administration of intravenous contrast. Multiplanar CT image reconstructions and MIPs were obtained to evaluate the vascular anatomy.   RADIATION DOSE REDUCTION: This exam was performed according to the departmental dose-optimization program which includes automated exposure control, adjustment of the mA and/or kV according to patient size and/or use of iterative reconstruction technique.   CONTRAST:  OMNIPAQUE  IOHEXOL  350 MG/ML SOLN   COMPARISON:  Chest CT dated 10/04/2022.   FINDINGS: Cardiovascular: Top-normal cardiac size. There is biatrial dilatation. No pericardial effusion. Coronary vascular calcification. Calcification of the aortic valve.  Aneurysmal dilatation of the ascending aorta measuring approximately 4.7 cm in diameter and similar to prior CT. No aortic dissection. There is mild atherosclerotic calcification of the thoracic aorta. The origins of the great vessels of the aortic arch and the central pulmonary arteries appear patent.   Mediastinum/Nodes: No hilar or mediastinal adenopathy. The esophagus and thyroid  gland are grossly unremarkable no mediastinal fluid collection.   Lungs/Pleura: No focal consolidation, pleural effusion, or pneumothorax. Similar appearance of per 4 mm right middle lobe subpleural nodule. The central airways are patent.   Upper Abdomen: No acute findings.   Musculoskeletal: Degenerative changes of the spine and scoliosis. No acute osseous pathology.   Review of the MIP images confirms the above findings.   IMPRESSION: 1. No acute intrathoracic pathology. 2. Stable 4.7 cm aneurysm of the ascending aorta. Ascending thoracic aortic aneurysm. Recommend semi-annual imaging followup by CTA or MRA and referral to cardiothoracic surgery if not already obtained. This recommendation follows 2010 ACCF/AHA/AATS/ACR/ASA/SCA/SCAI/SIR/STS/SVM Guidelines for the Diagnosis and Management of Patients With Thoracic Aortic Disease. Circulation. 2010; 121: Z733-z630. Aortic aneurysm NOS (ICD10-I71.9) 3.  Aortic Atherosclerosis (ICD10-I70.0).     Electronically Signed   By: Vanetta Chou M.D.   On: 10/01/2023 15:08       A/P: Thoracic aortic aneurysm without rupture, unspecified part -4.7 ascending thoracic aortic aneurysm on CTA of chest.  He has bicuspid aortic valve. We discussed the natural history and and risk factors for growth of ascending aortic aneurysms. Discussed recommendations to minimize the risk of further expansion or dissection including careful blood pressure control, avoidance of contact sports and heavy lifting, attention to lipid management.  The patient does not yet meet  surgical criteria of >5.0 cm. The patient is aware of signs and symptoms of aortic dissection and when to present to the emergency department. -He is to keep track of symptoms of severe aortic stenosis and reach back out to cardiology and our clinic if symptoms start to worsen  -Follow up in 6 months with CTA of chest    Risk Modification:  Statin:  atorvastatin   Smoking cessation instruction/counseling given:  commended patient for quitting and reviewed strategies for preventing relapses  Patient was counseled on importance of Blood Pressure Control  They are instructed to contact their Primary Care Physician if they start to have blood pressure readings over 130s/90s. Do not ever stop blood pressure medications on your own, unless instructed by healthcare professional.  Please avoid use of Fluoroquinolones as this can potentially increase your risk of Aortic Rupture and/or Dissection  Patient educated on signs and symptoms of Aortic Dissection, handout also provided in AVS  Manuelita CHRISTELLA Rough, PA-C 10/08/23

## 2023-10-15 ENCOUNTER — Other Ambulatory Visit: Payer: Self-pay | Admitting: Cardiovascular Disease

## 2023-10-19 ENCOUNTER — Encounter (HOSPITAL_BASED_OUTPATIENT_CLINIC_OR_DEPARTMENT_OTHER): Admitting: Cardiology

## 2023-11-03 DIAGNOSIS — I10 Essential (primary) hypertension: Secondary | ICD-10-CM | POA: Diagnosis not present

## 2023-11-03 DIAGNOSIS — G4731 Primary central sleep apnea: Secondary | ICD-10-CM | POA: Diagnosis not present

## 2023-11-04 DIAGNOSIS — L821 Other seborrheic keratosis: Secondary | ICD-10-CM | POA: Diagnosis not present

## 2023-11-04 DIAGNOSIS — Z85828 Personal history of other malignant neoplasm of skin: Secondary | ICD-10-CM | POA: Diagnosis not present

## 2023-11-04 DIAGNOSIS — Z86006 Personal history of melanoma in-situ: Secondary | ICD-10-CM | POA: Diagnosis not present

## 2023-11-04 DIAGNOSIS — L578 Other skin changes due to chronic exposure to nonionizing radiation: Secondary | ICD-10-CM | POA: Diagnosis not present

## 2023-11-04 DIAGNOSIS — Z85820 Personal history of malignant melanoma of skin: Secondary | ICD-10-CM | POA: Diagnosis not present

## 2023-11-04 DIAGNOSIS — L570 Actinic keratosis: Secondary | ICD-10-CM | POA: Diagnosis not present

## 2023-11-04 DIAGNOSIS — L814 Other melanin hyperpigmentation: Secondary | ICD-10-CM | POA: Diagnosis not present

## 2023-11-04 DIAGNOSIS — Z08 Encounter for follow-up examination after completed treatment for malignant neoplasm: Secondary | ICD-10-CM | POA: Diagnosis not present

## 2023-11-04 DIAGNOSIS — D1801 Hemangioma of skin and subcutaneous tissue: Secondary | ICD-10-CM | POA: Diagnosis not present

## 2023-12-03 DIAGNOSIS — L570 Actinic keratosis: Secondary | ICD-10-CM | POA: Diagnosis not present

## 2023-12-04 DIAGNOSIS — G4731 Primary central sleep apnea: Secondary | ICD-10-CM | POA: Diagnosis not present

## 2023-12-04 DIAGNOSIS — I10 Essential (primary) hypertension: Secondary | ICD-10-CM | POA: Diagnosis not present

## 2023-12-05 DIAGNOSIS — G4733 Obstructive sleep apnea (adult) (pediatric): Secondary | ICD-10-CM | POA: Diagnosis not present

## 2023-12-15 ENCOUNTER — Ambulatory Visit: Payer: Self-pay | Admitting: *Deleted

## 2023-12-22 ENCOUNTER — Telehealth: Payer: Self-pay

## 2023-12-22 NOTE — Telephone Encounter (Signed)
 Jerry Velazquez

## 2023-12-23 ENCOUNTER — Telehealth: Payer: Self-pay | Admitting: *Deleted

## 2023-12-23 ENCOUNTER — Encounter: Payer: Self-pay | Admitting: Cardiology

## 2023-12-23 ENCOUNTER — Ambulatory Visit: Attending: Cardiology | Admitting: Cardiology

## 2023-12-23 VITALS — BP 136/74 | HR 94 | Ht 73.0 in | Wt 197.0 lb

## 2023-12-23 DIAGNOSIS — I10 Essential (primary) hypertension: Secondary | ICD-10-CM | POA: Diagnosis not present

## 2023-12-23 DIAGNOSIS — G4733 Obstructive sleep apnea (adult) (pediatric): Secondary | ICD-10-CM | POA: Diagnosis not present

## 2023-12-23 DIAGNOSIS — I471 Supraventricular tachycardia, unspecified: Secondary | ICD-10-CM

## 2023-12-23 DIAGNOSIS — I480 Paroxysmal atrial fibrillation: Secondary | ICD-10-CM

## 2023-12-23 DIAGNOSIS — R002 Palpitations: Secondary | ICD-10-CM

## 2023-12-23 NOTE — Telephone Encounter (Signed)
Order placed to [facility] via community message.

## 2023-12-23 NOTE — Telephone Encounter (Signed)
-----   Message from Wilbert Bihari sent at 12/23/2023  2:20 PM EST ----- Order chin strap

## 2023-12-23 NOTE — Patient Instructions (Signed)
 Medication Instructions:  Your physician recommends that you continue on your current medications as directed. Please refer to the Current Medication list given to you today.  *If you need a refill on your cardiac medications before your next appointment, please call your pharmacy*  Lab Work: None.  If you have labs (blood work) drawn today and your tests are completely normal, you will receive your results only by: MyChart Message (if you have MyChart) OR A paper copy in the mail If you have any lab test that is abnormal or we need to change your treatment, we will call you to review the results.  Testing/Procedures: None.  Follow-Up: At [facility], you and your health needs are our priority.  As part of our continuing mission to provide you with exceptional heart care, our providers are all part of one team.  This team includes your primary Cardiologist (physician) and Advanced Practice Providers or APPs (Physician Assistants and Nurse Practitioners) who all work together to provide you with the care you need, when you need it.  Your next appointment:   8 week(s)  Provider:   Dr. Wilbert Bihari, MD

## 2023-12-23 NOTE — Progress Notes (Signed)
 Sleep Medicine CONSULT Note    Date:  12/23/2023   ID:  Jerry Velazquez, DOB 1945-11-07, MRN [id number]  PCP:  [name], MD  Cardiologist: Dorn Lesches, MD   Chief Complaint  Patient presents with   New Patient (Initial Visit)    OSA    History of Present Illness:  Jerry Velazquez is a 78 y.o. male who is being seen today for the evaluation of obstructive sleep apnea at the request of [name], MD.  This is a 78 year old male with a history of bicuspid aortic valve, ascending thoracic aortic aneurysm, hypertension and persistent atrial fibrillation.  He is followed by Dr. Wadie for general cardiology and Dr. [name] for his A-fib.  He was last seen by Dr. Kitty on 06/26/2023 and was found to have nocturnal bradycardia and he was concerned about possible nocturnal sleep apnea and therefore ordered sleep study.  Patient underwent home sleep study on 07/10/2023 showing moderate obstructive sleep apnea with an AHI of 17.5/h.  He did have mild snoring.  He underwent CPAP titration but could not be adequately tried titrated on CPAP due to ongoing respiratory events and ultimately ended up on BiPAP S/T at 18/14 cm H2O with an EPR of 2 and a backup rate of 13 breaths/min.  He was ordered a large ResMed F20 fullface mask.  He is now referred for sleep medicine consultation for treatment of obstructive sleep apnea  He is doing well with his PAP device and thinks that he has gotten used to it.  He tried  the [facility] but could not get used to it so he changed to the nasal pillow mask which He tolerates well. He feels the pressure is adequate.  He has not really noticed a difference in how he feels during the day or when he wakes up. He has a lot of problems with mouth dryness and has tried to increase the humidity but now runs out of H2O. He denies any significant nasal dryness or nasal congestion.  He does not think that he snores. An Epworth Sleepiness Scale score was calculated the  office today and this endorsed at 3 arguing against residual daytime sleepiness. Patient denies any episodes of bruxism, restless legs, No gagging hallucinations or cataplectic events.    Past Medical History:  Diagnosis Date   Aortic aneurysm    Aortic insufficiency due to bicuspid aortic valve    Aortic stenosis due to bicuspid aortic valve    Atrial flutter (HCC)    Bicuspid aortic valve    GERD (gastroesophageal reflux disease)    past hx- stopped alcohol and no more issues    NSVT (nonsustained ventricular tachycardia) (HCC)    Paroxysmal atrial fibrillation (HCC)    PVC's (premature ventricular contractions)    RBBB     Past Surgical History:  Procedure Laterality Date   COLONOSCOPY     12 yrs ago [facility]- normal per pt    CYST EXCISION     left knee    CYST EXCISION     tongue    Current Medications: Current Meds  Medication Sig   apixaban  (ELIQUIS ) 5 MG TABS tablet TAKE 1 TABLET BY MOUTH TWICE A DAY   atorvastatin  (LIPITOR) 20 MG tablet TAKE 1 TABLET BY MOUTH EVERY DAY   cyclobenzaprine  (FLEXERIL ) 10 MG tablet Take 1 tablet (10 mg total) by mouth 3 (three) times daily as needed for muscle spasms.   GLUCOSAMINE HCL PO Take 2,000 mg  by mouth daily.   lisinopril  (ZESTRIL ) 5 MG tablet TAKE 1 TABLET BY MOUTH EVERY DAY   metoprolol  tartrate (LOPRESSOR ) 25 MG tablet Take 0.5 tablets (12.5 mg total) by mouth every 6 (six) hours as needed (palpitations).   Omega-3 Fatty Acids (FISH OIL) 1000 MG CAPS Take 1,000 mg by mouth daily.   PFIZER COVID-19 VAC BIVALENT injection     Allergies:   Patient has no known allergies.   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   Social Drivers of Corporate Investment Banker Strain: Low Risk  (01/23/2023)   Overall Financial Resource Strain (CARDIA)    Difficulty of Paying Living Expenses: Not hard at all  Food Insecurity: No Food Insecurity (01/23/2023)   Hunger Vital Sign    Worried About Running Out of Food in the Last Year: Never true    Ran Out of Food in the Last Year: Never true  Transportation Needs: No Transportation Needs (01/23/2023)   PRAPARE - Administrator, Civil Service (Medical): No    Lack of Transportation (Non-Medical): No  Physical Activity: Sufficiently Active (01/23/2023)   Exercise Vital Sign    Days of Exercise per Week: 6 days    Minutes of Exercise per Session: 30 min  Stress: No Stress Concern Present (01/23/2023)   [name] of Occupational Health - Occupational Stress Questionnaire    Feeling of Stress : Not at all  Social Connections: Moderately Integrated (01/23/2023)   Social Connection and Isolation Panel    Frequency of Communication with Friends and Family: More than three times a week    Frequency of Social Gatherings with Friends and Family: Three times a week    Attends Religious Services: Never    Active Member of Clubs or Organizations: Yes    Attends Engineer, Structural: More than 4 times per year    Marital Status: Married     Family History:  The patient's family history includes COPD in his brother and sister; Cervical cancer in his paternal grandmother; Colon polyps in his sister; Heart disease in his mother; Hypertension in his brother  and father; Skin cancer in his brother; Stroke in his father.   ROS:   Please see the history of present illness.    ROS All other systems reviewed and are negative.      No data to display             PHYSICAL EXAM:   VS:  BP 136/74   Pulse 94   Ht 6' 1 (1.854 m)   Wt 197  lb (89.4 kg)   SpO2 99%   BMI 25.99 kg/m    GEN: Well nourished, well developed, in no acute distress  HEENT: normal  Neck: no JVD, carotid bruits, or masses Cardiac: irregularly irregular; no rubs, or gallops,no edema.  Intact distal pulses bilaterally. 2/6 SM at RUSB Respiratory:  clear to auscultation bilaterally, normal work of breathing GI: soft, nontender, nondistended, + BS MS: no deformity or atrophy  Skin: warm and dry, no rash Neuro:  Alert and Oriented x 3, Strength and sensation are intact Psych: euthymic mood, full affect  Wt Readings from Last 3 Encounters:  12/23/23 197 lb (89.4 kg)  10/08/23 198 lb (89.8 kg)  09/18/23 194 lb (88 kg)      Studies/Labs Reviewed:   Home sleep study, CPAP titration, PAP compliance download  Recent Labs: 06/02/2023: ALT 30; BUN 16; Creatinine, Ser 0.77; Hemoglobin 14.1; Platelets 169; Potassium 5.3; Sodium 141    CHA2DS2-VASc Score = 4   This indicates a 4.8% annual risk of stroke. The patient's score is based upon: CHF History: 0 HTN History: 1 Diabetes History: 0 Stroke History: 0 Vascular Disease History: 1 Age Score: 2 Gender Score: 0     ASSESSMENT:    1. OSA (obstructive sleep apnea)   2. Essential hypertension      PLAN:  In order of problems listed above:  OSA - The patient is tolerating PAP therapy well without any problems. The PAP download performed by his DME was personally reviewed and interpreted by me today and showed an AHI of 11.7 /hr on BiPAP ST at 18/14 with backup respiratory rate of 13 breaths/min cm H2O with 87% compliance in using more than 4 hours nightly.  The patient has been using and benefiting from PAP  use and will continue to benefit from therapy -His device appears to have a very large mask leak likely because he is mouth breathing since he uses a nasal pillow mask with no chin strap.  He also has bad mouth dryness -I encouraged him to use a humidifier in the room since he is running out of H2O in his chamber in the am -I will order a chin strap to help keep his mouth closed -Repeat download in 4 weeks  Hypertension - BP controlled on exam today - Continue lisinopril  5 mg daily and Lopressor  12.5 mg twice daily with as needed refills   Time Spent: 20 minutes total time of encounter, including 15 minutes spent in face-to-face patient care on the date of this encounter. This time includes coordination of care and counseling regarding above mentioned problem list. Remainder of non-face-to-face time involved reviewing chart documents/testing relevant to the patient encounter and documentation in the medical record. I have independently reviewed documentation from referring provider  Medication Adjustments/Labs and Tests Ordered: Current medicines are reviewed at length with the patient today.  Concerns regarding medicines are outlined above.  Medication changes, Labs and Tests ordered today are listed in the Patient Instructions below.  There are no Patient Instructions on file for this visit.  Followup with me in 8 weeks  Signed, Wilbert Bihari, MD  12/23/2023 2:08 PM    [facility] [address] [place], [place], [place]  [address] Phone: [phone]; Fax: [phone]

## 2023-12-28 ENCOUNTER — Encounter: Payer: Self-pay | Admitting: Cardiology

## 2024-01-19 ENCOUNTER — Ambulatory Visit (HOSPITAL_COMMUNITY)
Admission: RE | Admit: 2024-01-19 | Discharge: 2024-01-19 | Disposition: A | Discharge status: Home / Self Care | Source: Ambulatory Visit

## 2024-01-19 ENCOUNTER — Ambulatory Visit: Payer: Self-pay | Admitting: Cardiovascular Disease

## 2024-01-19 DIAGNOSIS — R001 Bradycardia, unspecified: Secondary | ICD-10-CM | POA: Diagnosis present

## 2024-01-19 DIAGNOSIS — E785 Hyperlipidemia, unspecified: Secondary | ICD-10-CM | POA: Diagnosis present

## 2024-01-19 LAB — ECHOCARDIOGRAM COMPLETE
AR max vel: 0.76 cm2
AV Area VTI: 0.71 cm2
AV Area mean vel: 0.82 cm2
AV Mean grad: 27.6 mmHg
AV Peak grad: 55.2 mmHg
Ao pk vel: 3.72 m/s
Area-P 1/2: 3.74 cm2
P 1/2 time: 440 ms
S' Lateral: 2.8 cm

## 2024-02-02 ENCOUNTER — Encounter: Payer: Self-pay | Admitting: Cardiovascular Disease

## 2024-02-02 ENCOUNTER — Ambulatory Visit: Admitting: Cardiovascular Disease

## 2024-02-02 VITALS — BP 110/80 | HR 76 | Ht 73.0 in | Wt 207.0 lb

## 2024-02-02 DIAGNOSIS — R001 Bradycardia, unspecified: Secondary | ICD-10-CM | POA: Diagnosis not present

## 2024-02-02 DIAGNOSIS — I7121 Aneurysm of the ascending aorta, without rupture: Secondary | ICD-10-CM | POA: Diagnosis not present

## 2024-02-02 DIAGNOSIS — E785 Hyperlipidemia, unspecified: Secondary | ICD-10-CM

## 2024-02-02 DIAGNOSIS — G4733 Obstructive sleep apnea (adult) (pediatric): Secondary | ICD-10-CM | POA: Diagnosis not present

## 2024-02-02 DIAGNOSIS — Q2381 Bicuspid aortic valve: Secondary | ICD-10-CM | POA: Diagnosis not present

## 2024-02-02 DIAGNOSIS — I10 Essential (primary) hypertension: Secondary | ICD-10-CM | POA: Diagnosis not present

## 2024-02-02 DIAGNOSIS — I480 Paroxysmal atrial fibrillation: Secondary | ICD-10-CM | POA: Diagnosis not present

## 2024-02-02 NOTE — Assessment & Plan Note (Signed)
Rate controlled on Eliquis oral anticoagulation. 

## 2024-02-02 NOTE — Assessment & Plan Note (Signed)
 Followed by Dr. Shlomo, on CPAP.

## 2024-02-02 NOTE — Assessment & Plan Note (Signed)
 History of moderate ascending thoracic aortic aneurysm measuring 48 mm by 2D echo performed 01/19/2024.  This to be followed on an annual basis.  Dr. Lucas  follows this as well.  Thoracic aortic aneurysm

## 2024-02-02 NOTE — Assessment & Plan Note (Signed)
 History of essential hypertension with blood pressure measured today at 110/80.  He is on metoprolol  and lisinopril .

## 2024-02-02 NOTE — Progress Notes (Signed)
 "     02/02/2024 CORD[place] MANERA   June 28, 1945  [id number]  Primary Physician [name], MD Primary Cardiologist: Dorn JINNY Lesches MD [facility] [place], [place]  HPI:  Jerry Velazquez is a 79 y.o.   thin appearing married Caucasian male father of no children who is retired production manager.  He was referred by Michelene Cower, PA-C for cardiovascular evaluation because of aortic insufficiency.    I last  saw him in the office 07/30/2023.  He has no cardiac risk factors.  He was seen because of palpitations with a recent Holter monitor that showed PVCs and short atrial runs.  2D echo performed because of his soft murmur revealed a bicuspid aortic valve with mild stenosis and mild to moderate aortic insufficiency with normal LV size and function.  There was noted mild dilatation of the thoracic aorta.  He also was experiences a quivering sensation in his chest.  He has denied chest pain or shortness of breath in the past.   I referred him to Dr. Kelsie and Dr. Lucas as well.  Ultimately he was begun on Eliquis  oral anticoagulation.  A chest CTA was performed 10/31/2017 revealing a 4.9 cm ascending thoracic aortic aneurysm.  This will be followed on an semiannual basis.  He will ultimately need thoracic aortic repair as well as AVR.  It is possible that he would also benefit from a surgical maze procedure during the operation and removal of his left atrial appendage but I will leave this to Dr. Lucas to decide.   An echo performed 09/12/2022 revealed normal LV systolic function, grade 1 diastolic dysfunction, mild to moderate AI, moderate AS (bicuspid aortic valve) with an aortic valve area of 1.19 cm cm and a peak gradient of 41 mmHg.  This was unchanged from his prior echo.  Recent CTA performed 09/12/2021 revealed his thoracic aorta measured 48 mm, not at threshold for consideration for replacement.  He is followed by Dr. [name].  He has complained of several episodes of dizziness while  at rest and also amaurosis which she has had for many years.  He says that his exercise stamina has mildly reduced.  He is very active.   Since I saw him in the office 6 months ago he continues to do well.  He is completely asymptomatic.  He is followed in the A-fib clinic and was seen by Dr. Kennyth.  He is on Eliquis  oral anticoagulation.  I do not think he is a good current candidate for cardioversion given his severe aortic stenosis.  He is fairly active and walks daily.  He has obstructive sleep apnea on CPAP followed by Dr. Shlomo.  His last echo performed 01/19/2024 showed severe aortic stenosis with a valve area of 0.71 cm.  The peak gradient of 55 mmHg.  His aortic root measured 48 mm.   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   Social Drivers of Health   Tobacco Use: Medium Risk (02/02/2024)   Patient History    Smoking Tobacco Use: Former    Smokeless Tobacco Use: Never    Passive Exposure: Not on file  Financial Resource Strain: Low Risk (01/23/2023)   Overall Financial Resource Strain (CARDIA)    Difficulty of Paying Living Expenses: Not hard at all  Food Insecurity: No Food Insecurity (01/23/2023)   Hunger Vital Sign    Worried  About Running Out of Food in the Last Year: Never true    Ran Out of Food in the Last Year: Never true  Transportation Needs: No Transportation Needs (01/23/2023)   PRAPARE - Administrator, Civil Service (Medical): No    Lack of Transportation (Non-Medical): No  Physical Activity: Sufficiently Active (01/23/2023)   Exercise Vital Sign    Days of Exercise per Week: 6 days    Minutes of Exercise per Session: 30 min  Stress: No Stress Concern Present (01/23/2023)   [name] of Occupational Health - Occupational Stress Questionnaire    Feeling of Stress : Not at all  Social Connections: Moderately Integrated (01/23/2023)   Social Connection and Isolation Panel    Frequency of Communication with Friends and Family: More than three times a week    Frequency of Social Gatherings with Friends and Family: Three times a week    Attends Religious Services: Never    Active Member of Clubs or Organizations: Yes    Attends Banker Meetings: More than 4 times per year    Marital Status: Married  Catering Manager Violence: Not At Risk (01/23/2023)   Humiliation, Afraid, Rape, and Kick questionnaire    Fear of Current or Ex-Partner: No    Emotionally Abused: No    Physically Abused: No    Sexually Abused: No  Depression (PHQ2-9): Low Risk (01/23/2023)   Depression (PHQ2-9)    PHQ-2 Score: 2  Alcohol Screen: Low Risk (01/23/2023)   Alcohol Screen    Last Alcohol Screening Score (AUDIT): 0  Housing: Low Risk (01/23/2023)   Housing Stability Vital Sign    Unable to Pay for Housing in the Last Year: No    Number of Times Moved in the Last Year: 0    Homeless in the Last Year: No  Utilities: Not At Risk (01/23/2023)   AHC Utilities    Threatened with loss of utilities: No  Health Literacy: Adequate Health Literacy (01/23/2023)   B1300 Health Literacy    Frequency of need for help with medical instructions: Never     Review of Systems: General: negative for chills, fever, night  sweats or weight changes.  Cardiovascular: negative for chest pain, dyspnea on exertion, edema, orthopnea, palpitations, paroxysmal nocturnal dyspnea or shortness of breath Dermatological: negative for rash Respiratory: negative for cough or wheezing Urologic: negative for hematuria Abdominal: negative for nausea, vomiting, diarrhea, bright red blood per rectum, melena, or hematemesis Neurologic: negative for visual changes, syncope, or dizziness All other systems reviewed and are otherwise negative except as noted above.    Blood pressure 110/80, pulse 76, height 6' 1 (1.854  m), weight 207 lb (93.9 kg), SpO2 99%.  General appearance: alert and no distress Neck: no adenopathy, no carotid bruit, no JVD, supple, symmetrical, trachea midline, and thyroid  not enlarged, symmetric, no tenderness/mass/nodules Lungs: clear to auscultation bilaterally Heart: irregularly irregular rhythm and 2/6 outflow tract murmur consistent with aortic stenosis Extremities: extremities normal, atraumatic, no cyanosis or edema Pulses: 2+ and symmetric Skin: Skin color, texture, turgor normal. No rashes or lesions Neurologic: Grossly normal  EKG EKG Interpretation Date/Time:  Monday February 02 2024 10:36:35 EST Ventricular Rate:  76 PR Interval:    QRS Duration:  100 QT Interval:  394 QTC Calculation: 443 R Axis:   -28  Text Interpretation: Atrial fibrillation Minimal voltage criteria for LVH, may be normal variant ( R in aVL ) Cannot rule out Anteroseptal infarct , age undetermined When compared with ECG of 30-Jul-2023 10:47, Minimal criteria for Anteroseptal infarct are now Present Confirmed by Court Carrier [phone]) on 02/02/2024 10:39:38 AM    ASSESSMENT AND PLAN:   Dyslipidemia History of dyslipidemia on atorvastatin  with lipid profile performed 06/02/2023 revealing total cholesterol 121, LDL 61 and HDL 49.  Thoracic aortic aneurysm History of moderate ascending thoracic aortic aneurysm measuring  48 mm by 2D echo performed 01/19/2024.  This to be followed on an annual basis.  Dr. Lucas  follows this as well.  Thoracic aortic aneurysm  Bicuspid aortic valve History of bicuspid aortic valve with severe aortic stenosis by recent 2D echo performed 01/19/2024.  The aortic valve area is 0.71 cm which is unchanged from last echo with a peak gradient of 55 mmHg.  Patient is completely asymptomatic with normal LV function and mild concentric LVH.  Essential hypertension History of essential hypertension with blood pressure measured today at 110/80.  He is on metoprolol  and lisinopril .  Paroxysmal atrial fibrillation (HCC) Rate controlled on Eliquis  oral anticoagulation.  Obstructive sleep apnea Followed by Dr. Shlomo, on CPAP.     Carrier DOROTHA Court MD FACP,FACC,FAHA, [facility] 02/02/2024 10:50 AM    [1]  Current Meds  Medication Sig   apixaban  (ELIQUIS ) 5 MG TABS tablet TAKE 1 TABLET BY MOUTH TWICE A DAY   atorvastatin  (LIPITOR) 20 MG tablet TAKE 1 TABLET BY MOUTH EVERY DAY   GLUCOSAMINE HCL PO Take 2,000 mg by mouth daily.   lisinopril  (ZESTRIL ) 5 MG tablet TAKE 1 TABLET BY MOUTH EVERY DAY   metoprolol  tartrate (LOPRESSOR ) 25 MG tablet Take 0.5 tablets (12.5 mg total) by mouth every 6 (six) hours as needed (palpitations).   Omega-3 Fatty Acids (FISH OIL) 1000 MG CAPS Take 1,000 mg by mouth daily.  [2] No Known Allergies  "

## 2024-02-02 NOTE — Assessment & Plan Note (Signed)
 History of bicuspid aortic valve with severe aortic stenosis by recent 2D echo performed 01/19/2024.  The aortic valve area is 0.71 cm which is unchanged from last echo with a peak gradient of 55 mmHg.  Patient is completely asymptomatic with normal LV function and mild concentric LVH.

## 2024-02-02 NOTE — Patient Instructions (Signed)
 Medication Instructions:  Your physician recommends that you continue on your current medications as directed. Please refer to the Current Medication list given to you today.  *If you need a refill on your cardiac medications before your next appointment, please call your pharmacy*  Testing/Procedures: Your physician has requested that you have an echocardiogram. Echocardiography is a painless test that uses sound waves to create images of your heart. It provides your doctor with information about the size and shape of your heart and how well your hearts chambers and valves are working. This procedure takes approximately one hour. There are no restrictions for this procedure. Please do NOT wear cologne, perfume, aftershave, or lotions (deodorant is allowed). Please arrive 15 minutes prior to your appointment time.  Please note: We ask at that you not bring children with you during ultrasound (echo/ vascular) testing. Due to room size and safety concerns, children are not allowed in the ultrasound rooms during exams. Our front office staff cannot provide observation of children in our lobby area while testing is being conducted. An adult accompanying a patient to their appointment will only be allowed in the ultrasound room at the discretion of the ultrasound technician under special circumstances. We apologize for any inconvenience. **To do in January 2027**   Follow-Up: At [facility], you and your health needs are our priority.  As part of our continuing mission to provide you with exceptional heart care, our providers are all part of one team.  This team includes your primary Cardiologist (physician) and Advanced Practice Providers or APPs (Physician Assistants and Nurse Practitioners) who all work together to provide you with the care you need, when you need it.  Your next appointment:   6 month(s)  Provider:   Dorn Lesches, MD    We recommend signing up for the patient portal  called MyChart.  Sign up information is provided on this After Visit Summary.  MyChart is used to connect with patients for Virtual Visits (Telemedicine).  Patients are able to view lab/test results, encounter notes, upcoming appointments, etc.  Non-urgent messages can be sent to your provider as well.   To learn more about what you can do with MyChart, go to [email or web address].   Other Instructions

## 2024-02-02 NOTE — Assessment & Plan Note (Signed)
 History of dyslipidemia on atorvastatin  with lipid profile performed 06/02/2023 revealing total cholesterol 121, LDL 61 and HDL 49.

## 2024-02-05 ENCOUNTER — Other Ambulatory Visit: Payer: Self-pay | Admitting: Cardiovascular Disease

## 2024-02-05 DIAGNOSIS — I480 Paroxysmal atrial fibrillation: Secondary | ICD-10-CM

## 2024-03-12 ENCOUNTER — Ambulatory Visit: Admitting: Cardiology
